# Patient Record
Sex: Male | Born: 1950 | Race: White | Hispanic: No | Marital: Single | Smoking: Former smoker
Health system: Southern US, Community
[De-identification: ages and names within clinical notes are randomized; demographics above are authoritative.]

## PROBLEM LIST (undated history)

## (undated) DIAGNOSIS — R600 Localized edema: Secondary | ICD-10-CM

## (undated) DIAGNOSIS — E7800 Pure hypercholesterolemia, unspecified: Secondary | ICD-10-CM

## (undated) DIAGNOSIS — M1990 Unspecified osteoarthritis, unspecified site: Secondary | ICD-10-CM

## (undated) DIAGNOSIS — T7840XA Allergy, unspecified, initial encounter: Secondary | ICD-10-CM

## (undated) DIAGNOSIS — Z9989 Dependence on other enabling machines and devices: Secondary | ICD-10-CM

## (undated) DIAGNOSIS — K759 Inflammatory liver disease, unspecified: Secondary | ICD-10-CM

## (undated) DIAGNOSIS — J869 Pyothorax without fistula: Secondary | ICD-10-CM

## (undated) DIAGNOSIS — M2550 Pain in unspecified joint: Secondary | ICD-10-CM

## (undated) DIAGNOSIS — R7303 Prediabetes: Secondary | ICD-10-CM

## (undated) DIAGNOSIS — I2510 Atherosclerotic heart disease of native coronary artery without angina pectoris: Secondary | ICD-10-CM

## (undated) DIAGNOSIS — K219 Gastro-esophageal reflux disease without esophagitis: Secondary | ICD-10-CM

## (undated) DIAGNOSIS — I10 Essential (primary) hypertension: Secondary | ICD-10-CM

## (undated) DIAGNOSIS — F419 Anxiety disorder, unspecified: Secondary | ICD-10-CM

## (undated) DIAGNOSIS — G4730 Sleep apnea, unspecified: Secondary | ICD-10-CM

## (undated) DIAGNOSIS — R0602 Shortness of breath: Secondary | ICD-10-CM

## (undated) DIAGNOSIS — J189 Pneumonia, unspecified organism: Secondary | ICD-10-CM

## (undated) DIAGNOSIS — E119 Type 2 diabetes mellitus without complications: Secondary | ICD-10-CM

## (undated) HISTORY — DX: Pyothorax without fistula: J86.9

## (undated) HISTORY — DX: Sleep apnea, unspecified: G47.30

## (undated) HISTORY — PX: HERNIA REPAIR: SHX51

## (undated) HISTORY — PX: EYE SURGERY: SHX253

## (undated) HISTORY — PX: INGUINAL HERNIA REPAIR: SUR1180

## (undated) HISTORY — PX: APPENDECTOMY: SHX54

## (undated) HISTORY — DX: Type 2 diabetes mellitus without complications: E11.9

## (undated) HISTORY — DX: Localized edema: R60.0

## (undated) HISTORY — DX: Pain in unspecified joint: M25.50

## (undated) HISTORY — DX: Dependence on other enabling machines and devices: Z99.89

## (undated) HISTORY — DX: Allergy, unspecified, initial encounter: T78.40XA

## (undated) HISTORY — DX: Shortness of breath: R06.02

---

## 1979-07-09 DIAGNOSIS — K759 Inflammatory liver disease, unspecified: Secondary | ICD-10-CM

## 1979-07-09 HISTORY — DX: Inflammatory liver disease, unspecified: K75.9

## 1998-06-23 ENCOUNTER — Emergency Department (HOSPITAL_COMMUNITY): Admission: EM | Admit: 1998-06-23 | Discharge: 1998-06-24 | Payer: Self-pay | Admitting: Emergency Medicine

## 1998-07-06 ENCOUNTER — Emergency Department (HOSPITAL_COMMUNITY): Admission: EM | Admit: 1998-07-06 | Discharge: 1998-07-06 | Payer: Self-pay | Admitting: Emergency Medicine

## 2013-06-20 ENCOUNTER — Other Ambulatory Visit: Payer: Self-pay | Admitting: Gastroenterology

## 2013-07-22 ENCOUNTER — Encounter (HOSPITAL_COMMUNITY): Payer: Self-pay | Admitting: *Deleted

## 2013-07-26 ENCOUNTER — Encounter (HOSPITAL_COMMUNITY): Payer: Self-pay | Admitting: Pharmacy Technician

## 2013-08-13 ENCOUNTER — Ambulatory Visit (HOSPITAL_COMMUNITY): Payer: BC Managed Care – PPO | Admitting: Anesthesiology

## 2013-08-13 ENCOUNTER — Encounter (HOSPITAL_COMMUNITY): Payer: Self-pay | Admitting: Anesthesiology

## 2013-08-13 ENCOUNTER — Encounter (HOSPITAL_COMMUNITY)
Admission: RE | Disposition: A | Discharge status: Home / Self Care | Payer: Self-pay | Source: Ambulatory Visit | Attending: Gastroenterology

## 2013-08-13 ENCOUNTER — Ambulatory Visit (HOSPITAL_COMMUNITY)
Admission: RE | Admit: 2013-08-13 | Discharge: 2013-08-13 | Disposition: A | Discharge status: Home / Self Care | Payer: BC Managed Care – PPO | Source: Ambulatory Visit | Attending: Gastroenterology | Admitting: Gastroenterology

## 2013-08-13 DIAGNOSIS — I10 Essential (primary) hypertension: Secondary | ICD-10-CM | POA: Insufficient documentation

## 2013-08-13 DIAGNOSIS — E119 Type 2 diabetes mellitus without complications: Secondary | ICD-10-CM | POA: Insufficient documentation

## 2013-08-13 DIAGNOSIS — K5730 Diverticulosis of large intestine without perforation or abscess without bleeding: Secondary | ICD-10-CM | POA: Insufficient documentation

## 2013-08-13 DIAGNOSIS — Z1211 Encounter for screening for malignant neoplasm of colon: Secondary | ICD-10-CM | POA: Insufficient documentation

## 2013-08-13 DIAGNOSIS — E7800 Pure hypercholesterolemia, unspecified: Secondary | ICD-10-CM | POA: Insufficient documentation

## 2013-08-13 HISTORY — DX: Type 2 diabetes mellitus without complications: E11.9

## 2013-08-13 HISTORY — DX: Gastro-esophageal reflux disease without esophagitis: K21.9

## 2013-08-13 HISTORY — DX: Inflammatory liver disease, unspecified: K75.9

## 2013-08-13 HISTORY — PX: COLONOSCOPY WITH PROPOFOL: SHX5780

## 2013-08-13 HISTORY — DX: Pure hypercholesterolemia, unspecified: E78.00

## 2013-08-13 HISTORY — DX: Essential (primary) hypertension: I10

## 2013-08-13 LAB — GLUCOSE, CAPILLARY: Glucose-Capillary: 88 mg/dL (ref 70–99)

## 2013-08-13 SURGERY — COLONOSCOPY WITH PROPOFOL
Anesthesia: Monitor Anesthesia Care

## 2013-08-13 MED ORDER — KETAMINE HCL 10 MG/ML IJ SOLN
INTRAMUSCULAR | Status: DC | PRN
  Administered 2013-08-13: 20 mg via INTRAVENOUS

## 2013-08-13 MED ORDER — MIDAZOLAM HCL 5 MG/5ML IJ SOLN
INTRAMUSCULAR | Status: DC | PRN
  Administered 2013-08-13 (×2): 1 mg via INTRAVENOUS

## 2013-08-13 MED ORDER — SODIUM CHLORIDE 0.9 % IV SOLN: INTRAVENOUS | Status: DC

## 2013-08-13 MED ORDER — PROPOFOL INFUSION 10 MG/ML OPTIME
INTRAVENOUS | Status: DC | PRN
  Administered 2013-08-13: 140 ug/kg/min via INTRAVENOUS

## 2013-08-13 MED ORDER — LACTATED RINGERS IV SOLN
INTRAVENOUS | Status: DC | PRN
  Administered 2013-08-13: 10:00:00 via INTRAVENOUS

## 2013-08-13 MED ORDER — FENTANYL CITRATE 0.05 MG/ML IJ SOLN
INTRAMUSCULAR | Status: DC | PRN
  Administered 2013-08-13 (×2): 50 ug via INTRAVENOUS

## 2013-08-13 SURGICAL SUPPLY — 21 items

## 2013-08-13 NOTE — Anesthesia Postprocedure Evaluation (Signed)
Anesthesia Post Note  Patient: Marc Gates  Procedure(s) Performed: Procedure(s) (LRB): COLONOSCOPY WITH PROPOFOL (N/A)  Anesthesia type: MAC  Patient location: PACU  Post pain: Pain level controlled  Post assessment: Post-op Vital signs reviewed  Last Vitals: BP 127/89  Pulse 73  Temp(Src) 36.7 C (Oral)  Resp 20  Ht 5' 10.5" (1.791 m)  Wt 195 lb (88.451 kg)  BMI 27.57 kg/m2  SpO2 98%  Post vital signs: Reviewed  Level of consciousness: awake  Complications: No apparent anesthesia complications

## 2013-08-13 NOTE — Anesthesia Preprocedure Evaluation (Signed)
Anesthesia Evaluation  Patient identified by MRN, date of birth, ID band Patient awake    Reviewed: Allergy & Precautions, H&P , NPO status , Patient's Chart, lab work & pertinent test results  Airway Mallampati: II TM Distance: >3 FB Neck ROM: Full    Dental  (+) Dental Advisory Given   Pulmonary neg pulmonary ROS,  breath sounds clear to auscultation        Cardiovascular hypertension, Pt. on medications negative cardio ROS  Rhythm:Regular Rate:Normal     Neuro/Psych negative neurological ROS  negative psych ROS   GI/Hepatic Neg liver ROS, GERD-  Medicated,(+) Hepatitis -  Endo/Other  diabetes, Type 2, Oral Hypoglycemic Agents  Renal/GU negative Renal ROS     Musculoskeletal negative musculoskeletal ROS (+)   Abdominal   Peds  Hematology negative hematology ROS (+)   Anesthesia Other Findings   Reproductive/Obstetrics                           Anesthesia Physical Anesthesia Plan  ASA: II  Anesthesia Plan: MAC   Post-op Pain Management:    Induction: Intravenous  Airway Management Planned:   Additional Equipment:   Intra-op Plan:   Post-operative Plan:   Informed Consent: I have reviewed the patients History and Physical, chart, labs and discussed the procedure including the risks, benefits and alternatives for the proposed anesthesia with the patient or authorized representative who has indicated his/her understanding and acceptance.   Dental advisory given  Plan Discussed with: CRNA  Anesthesia Plan Comments:         Anesthesia Quick Evaluation

## 2013-08-13 NOTE — Transfer of Care (Signed)
Immediate Anesthesia Transfer of Care Note  Patient: Marc Gates  Procedure(s) Performed: Procedure(s): COLONOSCOPY WITH PROPOFOL (N/A)  Patient Location: PACU  Anesthesia Type:MAC  Level of Consciousness: awake, alert  and oriented  Airway & Oxygen Therapy: Patient Spontanous Breathing and Patient connected to face mask oxygen  Post-op Assessment: Report given to PACU RN and Post -op Vital signs reviewed and stable  Post vital signs: Reviewed and stable  Complications: No apparent anesthesia complications

## 2013-08-13 NOTE — Op Note (Signed)
Procedure: Screening  Endoscopist: Danise Edge  Premedication: Propofol administered by anesthesia  Procedure: The patient was placed in the left lateral decubitus position. Anal inspection and digital rectal exam were normal. The Pentax pediatric colonoscope was introduced into the rectum and advanced to the cecum. A normal-appearing ileocecal valve and appendiceal orifice were identified. Colonic preparation for the exam today was good.  Rectum. Normal. Retroflex view of the distal rectum normal.  Sigmoid colon and descending colon. Extensive colonic diverticulosis  Splenic flexure. Normal.  Transverse colon. Normal.  Hepatic flexure. Normal.  Ascending colon. Normal.  Cecum and ileocecal valve. Normal.  Assessment: Normal screening proctocolonoscopy to the cecum  Recommendations: Schedule repeat screening colonoscopy using propofol sedation in 10 years.

## 2013-08-13 NOTE — H&P (Signed)
  Procedure: Screening colonoscopy  History: The patient is a 62 year old male born February 13, 1951. On 02/15/2007 he underwent a normal incomplete screening colonoscopy; the cecum was not examined.  The patient is scheduled to undergo a repeat screening colonoscopy today.  Past medical history: Type 2 diabetes mellitus. Hypertension. Panic disorder. Hypercholesterolemia. Allergic rhinitis. Plantar fasciitis. Herniorrhaphy. Appendectomy. Retinal detachment surgery. Cataract surgery.  Medication allergies: Penicillin  Exam: The patient is alert and lying comfortably on the endoscopy stretcher. Lungs are clear to auscultation. Cardiac exam reveals a regular rhythm. Abdomen is soft nontender to palpation  Plan: Proceed with screening colonoscopy

## 2013-08-13 NOTE — Preoperative (Signed)
Beta Blockers   Reason not to administer Beta Blockers:Not Applicable 

## 2013-08-14 ENCOUNTER — Encounter (HOSPITAL_COMMUNITY): Payer: Self-pay | Admitting: Gastroenterology

## 2017-03-20 ENCOUNTER — Other Ambulatory Visit: Payer: Self-pay | Admitting: Internal Medicine

## 2017-03-20 DIAGNOSIS — Z136 Encounter for screening for cardiovascular disorders: Secondary | ICD-10-CM

## 2017-04-04 ENCOUNTER — Ambulatory Visit
Admission: RE | Admit: 2017-04-04 | Discharge: 2017-04-04 | Disposition: A | Discharge status: Home / Self Care | Payer: Medicare Other | Source: Ambulatory Visit | Attending: Internal Medicine | Admitting: Internal Medicine

## 2017-04-04 DIAGNOSIS — Z136 Encounter for screening for cardiovascular disorders: Secondary | ICD-10-CM

## 2019-02-11 ENCOUNTER — Emergency Department (HOSPITAL_COMMUNITY)
Admission: EM | Admit: 2019-02-11 | Discharge: 2019-02-11 | Disposition: A | Discharge status: Home / Self Care | Payer: Medicare Other | Attending: Emergency Medicine | Admitting: Emergency Medicine

## 2019-02-11 ENCOUNTER — Emergency Department (HOSPITAL_COMMUNITY): Payer: Medicare Other

## 2019-02-11 ENCOUNTER — Encounter (HOSPITAL_COMMUNITY): Payer: Self-pay

## 2019-02-11 DIAGNOSIS — J181 Lobar pneumonia, unspecified organism: Secondary | ICD-10-CM | POA: Diagnosis not present

## 2019-02-11 DIAGNOSIS — J90 Pleural effusion, not elsewhere classified: Secondary | ICD-10-CM | POA: Insufficient documentation

## 2019-02-11 DIAGNOSIS — Z7984 Long term (current) use of oral hypoglycemic drugs: Secondary | ICD-10-CM | POA: Insufficient documentation

## 2019-02-11 DIAGNOSIS — Z87891 Personal history of nicotine dependence: Secondary | ICD-10-CM | POA: Diagnosis not present

## 2019-02-11 DIAGNOSIS — Z79899 Other long term (current) drug therapy: Secondary | ICD-10-CM | POA: Insufficient documentation

## 2019-02-11 DIAGNOSIS — E119 Type 2 diabetes mellitus without complications: Secondary | ICD-10-CM | POA: Insufficient documentation

## 2019-02-11 DIAGNOSIS — J189 Pneumonia, unspecified organism: Secondary | ICD-10-CM

## 2019-02-11 DIAGNOSIS — I10 Essential (primary) hypertension: Secondary | ICD-10-CM | POA: Diagnosis not present

## 2019-02-11 DIAGNOSIS — R0602 Shortness of breath: Secondary | ICD-10-CM | POA: Diagnosis present

## 2019-02-11 LAB — BASIC METABOLIC PANEL
Anion gap: 12 (ref 5–15)
BUN: 20 mg/dL (ref 8–23)
CO2: 19 mmol/L — ABNORMAL LOW (ref 22–32)
Calcium: 9.1 mg/dL (ref 8.9–10.3)
Chloride: 104 mmol/L (ref 98–111)
Creatinine, Ser: 1.07 mg/dL (ref 0.61–1.24)
GFR calc Af Amer: >60 mL/min (ref >60)
GFR calc non Af Amer: >60 mL/min (ref >60)
Glucose, Bld: 215 mg/dL — ABNORMAL HIGH (ref 70–99)
Potassium: 4.4 mmol/L (ref 3.5–5.1)
Sodium: 135 mmol/L (ref 135–145)

## 2019-02-11 LAB — CBC WITH DIFFERENTIAL/PLATELET
Abs Immature Granulocytes: 0.24 10*3/uL — ABNORMAL HIGH (ref 0.00–0.07)
Basophils Absolute: 0.2 10*3/uL — ABNORMAL HIGH (ref 0.0–0.1)
Basophils Relative: 1 %
Eosinophils Absolute: 0.1 10*3/uL (ref 0.0–0.5)
Eosinophils Relative: 0 %
HCT: 44.1 % (ref 39.0–52.0)
Hemoglobin: 14.7 g/dL (ref 13.0–17.0)
Immature Granulocytes: 1 %
Lymphocytes Relative: 5 %
Lymphs Abs: 1.2 10*3/uL (ref 0.7–4.0)
MCH: 30.5 pg (ref 26.0–34.0)
MCHC: 33.3 g/dL (ref 30.0–36.0)
MCV: 91.5 fL (ref 80.0–100.0)
Monocytes Absolute: 1.9 10*3/uL — ABNORMAL HIGH (ref 0.1–1.0)
Monocytes Relative: 7 %
Neutro Abs: 23 10*3/uL — ABNORMAL HIGH (ref 1.7–7.7)
Neutrophils Relative %: 86 %
Platelets: 344 10*3/uL (ref 150–400)
RBC: 4.82 MIL/uL (ref 4.22–5.81)
RDW: 12.5 % (ref 11.5–15.5)
WBC: 26.5 10*3/uL — ABNORMAL HIGH (ref 4.0–10.5)
nRBC: 0 % (ref 0.0–0.2)

## 2019-02-11 LAB — TROPONIN I: Troponin I: <0.03 ng/mL (ref <0.03)

## 2019-02-11 MED ORDER — CEFPODOXIME PROXETIL 200 MG PO TABS: 200.0000 mg | ORAL_TABLET | Freq: Two times a day (BID) | ORAL | 0 refills | Status: AC

## 2019-02-11 MED ORDER — IOHEXOL 350 MG/ML SOLN
100.0000 mL | Freq: Once | INTRAVENOUS | Status: AC | PRN
  Administered 2019-02-11: 100 mL via INTRAVENOUS

## 2019-02-11 MED ORDER — SODIUM CHLORIDE (PF) 0.9 % IJ SOLN
INTRAMUSCULAR | Status: AC
  Filled 2019-02-11: qty 50

## 2019-02-11 MED ORDER — AZITHROMYCIN 250 MG PO TABS
500.0000 mg | ORAL_TABLET | Freq: Once | ORAL | Status: AC
  Administered 2019-02-11: 18:00:00 500 mg via ORAL
  Filled 2019-02-11 (×2): qty 2

## 2019-02-11 MED ORDER — SODIUM CHLORIDE 0.9 % IV BOLUS
1000.0000 mL | Freq: Once | INTRAVENOUS | Status: AC
  Administered 2019-02-11: 1000 mL via INTRAVENOUS

## 2019-02-11 MED ORDER — DOXYCYCLINE HYCLATE 100 MG PO CAPS: 100.0000 mg | ORAL_CAPSULE | Freq: Two times a day (BID) | ORAL | 0 refills | Status: AC

## 2019-02-11 MED ORDER — ACETAMINOPHEN 325 MG PO TABS
650.0000 mg | ORAL_TABLET | Freq: Once | ORAL | Status: AC
  Administered 2019-02-11: 650 mg via ORAL
  Filled 2019-02-11: qty 2

## 2019-02-11 MED ORDER — AZITHROMYCIN 250 MG PO TABS: 500.0000 mg | ORAL_TABLET | Freq: Once | ORAL | Status: DC

## 2019-02-11 MED ORDER — SODIUM CHLORIDE 0.9 % IV SOLN
1.0000 g | Freq: Once | INTRAVENOUS | Status: AC
  Administered 2019-02-11: 1 g via INTRAVENOUS
  Filled 2019-02-11: qty 10

## 2019-02-11 NOTE — ED Provider Notes (Signed)
[facility]-EMERGENCY DEPT Provider Note   CSN: [id number] Arrival date & time: 02/11/19  1140    History   Chief Complaint Chief Complaint  Patient presents with   Shortness of Breath    HPI Marc Gates is a 68 y.o. male.     68 year old male with history of non-insulin-dependent diabetes, hypertension, high cholesterol, hepatitis and GERD presents with complaint of shortness of breath and right-sided chest pain.  Patient states that he has a baseline level of shortness of breath due to being obese however states he has been more short of breath since last night with pain in his right side chest, worse with taking a deep breath, palpation, movement.  Patient called his PCP today, does not believe he has coronavirus as he has not had any fevers however was sent to the emergency room due to his shortness of breath.  Patient reports 2 recent extended trips to [place], one in January ([place], states upon returning home he was sick for about 3 weeks) and 1 in March ([place], returned to [name] 01/11/2019). Denies any leg swelling, no history of previous PE or DVT but denies abdominal pain, nausea, vomiting, diaphoresis.  Patient states symptoms improved somewhat with taking Xanax, last took his Xanax an hour and half prior to arrival.  No other complaints or concerns.  Marc Gates was evaluated in Emergency Department on 02/11/2019 for the symptoms described in the history of present illness. He was evaluated in the context of the global COVID-19 pandemic, which necessitated consideration that the patient might be at risk for infection with the SARS-CoV-2 virus that causes COVID-19. Institutional protocols and algorithms that pertain to the evaluation of patients at risk for COVID-19 are in a state of rapid change based on information released by regulatory bodies including the CDC and federal and state organizations. These policies and algorithms were followed during the patient's  care in the ED.      Past Medical History:  Diagnosis Date   Diabetes mellitus without complication (HCC)    GERD (gastroesophageal reflux disease)    Hepatitis 1980'S   HEPATITIS B   High cholesterol    Hypertension     There are no active problems to display for this patient.   Past Surgical History:  Procedure Laterality Date   APPENDECTOMY  AGE 64 OR 13   COLONOSCOPY WITH PROPOFOL N/A 08/13/2013   Procedure: COLONOSCOPY WITH PROPOFOL;  Surgeon: Charolett Bumpers, MD;  Location: [facility];  Service: Endoscopy;  Laterality: N/A;   HERNIA REPAIR  40 WEEKS OLD        Home Medications    Prior to Admission medications   Medication Sig Start Date End Date Taking? Authorizing Provider  acetic acid-hydrocortisone (VOSOL-HC) OTIC solution Place 3 drops into the left ear 3 (three) times daily. 02/08/19  Yes [provider]  ALPRAZolam (XANAX) 0.25 MG tablet Take 0.25 mg by mouth every 6 (six) hours as needed for anxiety.   Yes [provider]  amLODipine-valsartan (EXFORGE) 5-160 MG tablet Take 1 tablet by mouth daily. 12/11/18  Yes [provider]  atorvastatin (LIPITOR) 20 MG tablet Take 20 mg by mouth every evening.   Yes [provider]  finasteride (PROSCAR) 5 MG tablet Take 5 mg by mouth every evening. 01/14/19  Yes [provider]  fluticasone (FLONASE) 50 MCG/ACT nasal spray Place 2 sprays into both nostrils daily. 01/14/19  Yes [provider]  glipiZIDE (GLUCOTROL) 5 MG tablet Take  5 mg by mouth daily. 12/29/18  Yes [provider]  ibuprofen (ADVIL,MOTRIN) 200 MG tablet Take 400-600 mg by mouth daily as needed for fever or moderate pain.   Yes [provider]  imipramine (TOFRANIL-PM) 75 MG capsule Take 75 mg by mouth at bedtime.   Yes [provider]  levalbuterol (XOPENEX HFA) 45 MCG/ACT inhaler Inhale 1-2 puffs into the lungs See admin instructions. Every 4 to 6 hours as needed for  wheezing or shortness of breath 12/11/18  Yes [provider]  levocetirizine (XYZAL) 5 MG tablet Take 5 mg by mouth every evening. 01/14/19  Yes [provider]  Menthol, Topical Analgesic, (BIOFREEZE EX) Apply topically.   Yes [provider]  metFORMIN (GLUCOPHAGE-XR) 500 MG 24 hr tablet Take 1,000 mg by mouth 2 (two) times daily. 12/11/18  Yes [provider]  MYRBETRIQ 50 MG TB24 tablet Take 50 mg by mouth every evening. 01/14/19  Yes [provider]  ranitidine (ZANTAC) 150 MG tablet Take 150 mg by mouth 2 (two) times daily.   Yes [provider]  Spacer/Aero-Holding Chambers (AEROCHAMBER PLUS FLO-VU LARGE) MISC See admin instructions. 12/11/18  Yes [provider]  trolamine salicylate (ASPERCREME) 10 % cream Apply 1 application topically as needed for muscle pain.   Yes [provider]  TRULICITY 0.75 MG/0.5ML SOPN Inject 0.75 mg into the skin once a week. 11/20/18  Yes [provider]  zolpidem (AMBIEN) 10 MG tablet Take 10 mg by mouth at bedtime. 01/29/19  Yes [provider]  cefpodoxime (VANTIN) 200 MG tablet Take 1 tablet (200 mg total) by mouth 2 (two) times daily for 7 days. 02/11/19 02/18/19  Jeannie Fend, PA-C  doxycycline (VIBRAMYCIN) 100 MG capsule Take 1 capsule (100 mg total) by mouth 2 (two) times daily for 7 days. 02/11/19 02/18/19  Jeannie Fend, PA-C  EPINEPHrine 0.3 mg/0.3 mL IJ SOAJ injection INJECT INTRAMUSCULARLY AS DIRECTED 12/07/18   [provider]    Family History History reviewed. No pertinent family history.  Social History Social History   Tobacco Use   Smoking status: Former Smoker    Packs/day: 2.00    Years: 10.00    Pack years: 20.00    Types: Cigarettes   Smokeless tobacco: Never Used  Substance Use Topics   Alcohol use: No   Drug use: No    Comment: QUIT SMOKING 25 YRS AGO     Allergies   Penicillins   Review of Systems Review of Systems    Constitutional: Negative for chills, diaphoresis and fever.  HENT: Negative for congestion.   Respiratory: Positive for shortness of breath. Negative for cough, chest tightness and wheezing.   Cardiovascular: Positive for chest pain. Negative for palpitations and leg swelling.  Gastrointestinal: Negative for abdominal pain, constipation, diarrhea, nausea and vomiting.  Musculoskeletal: Negative for arthralgias and myalgias.  Skin: Negative for rash and wound.  Allergic/Immunologic: Positive for immunocompromised state.  Neurological: Negative for dizziness and weakness.  Psychiatric/Behavioral: Negative for confusion.  All other systems reviewed and are negative.    Physical Exam Updated Vital Signs BP 127/82    Pulse (!) 113    Temp 97.9 F (36.6 C) (Oral)    Resp 14    SpO2 93%   Physical Exam Vitals signs and nursing note reviewed.  Constitutional:      General: He is not in acute distress.    Appearance: He is well-developed. He is obese. He is not diaphoretic.  HENT:  Head: Normocephalic and atraumatic.  Cardiovascular:     Rate and Rhythm: Regular rhythm. Tachycardia present.     Pulses: Normal pulses.     Heart sounds: No murmur.  Pulmonary:     Effort: Pulmonary effort is normal. Tachypnea present.     Breath sounds: Normal breath sounds. No decreased breath sounds or wheezing.  Chest:     Chest wall: Tenderness present.    Abdominal:     Palpations: Abdomen is soft.     Tenderness: There is no abdominal tenderness.  Musculoskeletal:     Right lower leg: He exhibits no tenderness. No edema.     Left lower leg: He exhibits no tenderness. No edema.  Skin:    General: Skin is warm and dry.     Findings: No erythema.  Neurological:     Mental Status: He is alert and oriented to person, place, and time.  Psychiatric:        Mood and Affect: Mood is anxious.     Comments: Pacing the room      ED Treatments / Results  Labs (all labs ordered are listed,  but only abnormal results are displayed) Labs Reviewed  BASIC METABOLIC PANEL - Abnormal; Notable for the following components:      Result Value   CO2 19 (*)    Glucose, Bld 215 (*)    All other components within normal limits  CBC WITH DIFFERENTIAL/PLATELET - Abnormal; Notable for the following components:   WBC 26.5 (*)    Neutro Abs 23.0 (*)    Monocytes Absolute 1.9 (*)    Basophils Absolute 0.2 (*)    Abs Immature Granulocytes 0.24 (*)    All other components within normal limits  CULTURE, BLOOD (ROUTINE X 2)  CULTURE, BLOOD (ROUTINE X 2)  TROPONIN I    EKG EKG Interpretation  Date/Time:  Monday February 11 2019 11:47:28 EDT Ventricular Rate:  128 PR Interval:    QRS Duration: 98 QT Interval:  311 QTC Calculation: 454 R Axis:   -85 Text Interpretation:  Sinus tachycardia Probable left atrial enlargement RSR' in V1 or V2, right VCD or RVH Left ventricular hypertrophy Inferior infarct, old No old tracing to compare Confirmed by Azalia Bilisampos, Kevin ([id number]) on 02/11/2019 12:53:35 PM Also confirmed by Azalia Bilisampos, Kevin ([id number]), editor Sheppard EvensSimpson, Miranda [phone]  on 02/11/2019 3:14:52 PM   Radiology Dg Chest 2 View  Result Date: 02/11/2019 CLINICAL DATA:  Short of breath EXAM: CHEST - 2 VIEW COMPARISON:  None. FINDINGS: Upper normal heart size. Patchy airspace opacities at the right lung base are worrisome for pneumonia. Left lung is clear. No pneumothorax. No pleural effusion. IMPRESSION: Patchy airspace disease at the right lung base is worrisome for pneumonia. Followup PA and lateral chest X-ray is recommended in 3-4 weeks following trial of antibiotic therapy to ensure resolution and exclude underlying malignancy. Electronically Signed   By: Jolaine ClickArthur  Hoss M.D.   On: 02/11/2019 12:27   Ct Angio Chest Pe W/cm &/or Wo Cm  Result Date: 02/11/2019 CLINICAL DATA:  Shortness of breath and chest pain EXAM: CT ANGIOGRAPHY CHEST WITH CONTRAST TECHNIQUE: Multidetector CT imaging of the chest was performed  using the standard protocol during bolus administration of intravenous contrast. Multiplanar CT image reconstructions and MIPs were obtained to evaluate the vascular anatomy. CONTRAST:  83 ml OMNIPAQUE IOHEXOL 350 MG/ML SOLN COMPARISON:  Chest radiograph February 11, 2019 FINDINGS: Cardiovascular: There is no central pulmonary embolus evident. More peripheral pulmonary there is no appreciable thoracic  aortic aneurysm or dissection. There are some mild foci of calcification in proximal visualized great vessels. Visualized great vessels otherwise appear unremarkable. There is aortic atherosclerosis. There are foci coronary artery calcification. There is no pericardial effusion or pericardial thickening evident. Vessels are less than optimally assessed due to a degree of motion artifact. There is no overt pulmonary embolus in the more peripheral vessels. Mediastinum/Nodes: Thyroid appears unremarkable. No adenopathy is evident in the thoracic region. No esophageal lesions are appreciable. Lungs/Pleura: There is a moderate pleural effusion on the right. There is patchy atelectatic change and mild consolidation in the right lower lobe. Areas of atelectatic change are noted elsewhere in each lower lobe as well as in each right middle lobe and inferior lingular region. On axial slice 95 series 10, there is a 3 mm nodular opacity in the right middle lobe. On axial slice 59 series 10, there is a nodular opacity measuring 8 x 4 mm in the anterior segment of the right upper lobe. Upper Abdomen: Visualized upper abdominal structures appear unremarkable. Musculoskeletal: No blastic or lytic bone lesions are evident. No chest wall lesions appreciable. Review of the MIP images confirms the above findings. IMPRESSION: 1. No evidence of central pulmonary embolus. Note that motion artifact precludes optimal assessment of more peripheral pulmonary artery branches. No obvious pulmonary embolus is evident in these more peripheral  structures. 2. There is patchy atelectatic change and mild consolidation in the right lower lobe. This could reflect pneumonia. There is a moderate right pleural effusion. 3. There is a nodular opacity in the right upper lobe measuring 8 x 4 mm. Non-contrast chest CT at 6-12 months is recommended. If the nodule is stable at time of repeat CT, then future CT at 18-24 months (from today's scan) is considered optional for low-risk patients, but is recommended for high-risk patients. This recommendation follows the consensus statement: Guidelines for Management of Incidental Pulmonary Nodules Detected on CT Images: From the Fleischner Society 2017; Radiology 2017; 284:228-243. Smaller nodular opacity noted in the right middle lobe. 4. There are foci of aortic atherosclerosis. Foci of coronary artery calcification noted. Aortic Atherosclerosis (ICD10-I70.0). Electronically Signed   By: Bretta Bang III M.D.   On: 02/11/2019 14:58    Procedures Procedures (including critical care time)  Medications Ordered in ED Medications  sodium chloride (PF) 0.9 % injection (0 mLs  Hold 02/11/19 1501)  iohexol (OMNIPAQUE) 350 MG/ML injection 100 mL (100 mLs Intravenous Contrast Given 02/11/19 1424)  acetaminophen (TYLENOL) tablet 650 mg (650 mg Oral Given 02/11/19 1604)  sodium chloride 0.9 % bolus 1,000 mL (1,000 mLs Intravenous New Bag/Given 02/11/19 1716)  cefTRIAXone (ROCEPHIN) 1 g in sodium chloride 0.9 % 100 mL IVPB (1 g Intravenous New Bag/Given 02/11/19 1610)  azithromycin (ZITHROMAX) tablet 500 mg (500 mg Oral Given 02/11/19 1745)     Initial Impression / Assessment and Plan / ED Course  I have reviewed the triage vital signs and the nursing notes.  Pertinent labs & imaging results that were available during my care of the patient were reviewed by me and considered in my medical decision making (see chart for details).  Clinical Course as of Feb 10 1857  Mon Feb 11, 2019  3661 68 year old male with complaint  of shortness of breath and right-sided chest pain since of last night.  Pain is worse when trying to lie supine.  On exam patient is anxious appearing, pacing the room, tachycardic, mildly tachypneic, maintaining room air sats in the upper 90s.  No calf swelling or tenderness, no pitting edema lower extremities.  Differential diagnosis considered but not limited to PE, pneumonia, CHF.  Review of lab work shows WBC 26.5k, with increase in neutrophils, monocytes. BMP with Co2 19. Troponin negative x 1. EKG without acute ischemic changes. CXR concerning for RLL PNA.  CTA chest obtained to evaluate to PE vs PNA vs effusion. CT A chest with right pleural effusion, possible PNA, recommends repeat CT in 6-12 months.  Case reviewed with Dr. Patria Mane, ER attending who has seen the patient- recommends IV fluids, Tylenol, abx. Plan is to reassess after treatment.   [LM]  1752 Patient appears more relaxed, respirations even and unlabored.  Heart rate 109.  Plan is to finish IV fluids, ambulate and possibly discharge if able to maintain O2 sats on room air.   [LM]  1841 Patient ambulatory with slight increased work in breathing, O2 93% at the lowest and quickly improves with rest. Offered admission vs closed PCP follow up with patient. Patient prefers dc at this time, understands if he declines at all or develops fever he should return to the ER and may need admission. Patient to call his PCP in the morning to arrange follow up.    [LM]    Clinical Course User Index [LM] Jeannie Fend, PA-C      Final Clinical Impressions(s) / ED Diagnoses   Final diagnoses:  Community acquired pneumonia of right lower lobe of lung (HCC)  Pleural effusion on right    ED Discharge Orders         Ordered    cefpodoxime (VANTIN) 200 MG tablet  2 times daily     02/11/19 1849    doxycycline (VIBRAMYCIN) 100 MG capsule  2 times daily     02/11/19 1849           Jeannie Fend, PA-C 02/11/19 Evern Bio,  MD 02/15/19 1454

## 2019-02-11 NOTE — ED Notes (Signed)
Bed: SX28 Expected date:  Expected time:  Means of arrival:  Comments: Tri 8

## 2019-02-11 NOTE — ED Triage Notes (Addendum)
Shob "for a long time" but today woke up with worse shob.  Patient called primary doctor and told to come to ED for DG.   Last night had a coughing spell and woke up around 4am and could not walk from pain from coughing and shob.   C/O dry cough   C/O right rib cage pain   Denies n/v, diarrhea, and fever   Sharp pain when patient takes a deep break.  Travel to [place] in January and [place] in March.    Mask on patient in triage.   Patient took a xanax about 1.5 hours ago  Hx. Allergies, DM 2, Hypertension   Patient ambulatory in triage and able to speak in complete sentence with no problems.

## 2019-02-11 NOTE — Discharge Instructions (Signed)
Take Vantin and Doxycycline as prescribed and complete the full course. Hold Metformin tonight, continue with regular dose tomorrow. Call your doctor tomorrow for close follow up. Return to ER for any worsening or concerning symptoms. Monitor for fever (100.4 or higher).  Review CT report with your doctor, may need a repeat chest CT in 6-12 months.

## 2019-02-11 NOTE — ED Notes (Signed)
Bed: WTR8 Expected date:  Expected time:  Means of arrival:  Comments: 

## 2019-02-16 LAB — CULTURE, BLOOD (ROUTINE X 2)
Culture: NO GROWTH
Culture: NO GROWTH
Special Requests: ADEQUATE
Special Requests: ADEQUATE

## 2019-03-04 ENCOUNTER — Ambulatory Visit
Admission: RE | Admit: 2019-03-04 | Discharge: 2019-03-04 | Disposition: A | Discharge status: Home / Self Care | Payer: Medicare Other | Source: Ambulatory Visit | Attending: Internal Medicine | Admitting: Internal Medicine

## 2019-03-04 ENCOUNTER — Other Ambulatory Visit: Payer: Self-pay | Admitting: Internal Medicine

## 2019-03-04 ENCOUNTER — Other Ambulatory Visit: Payer: Self-pay

## 2019-03-04 DIAGNOSIS — J189 Pneumonia, unspecified organism: Secondary | ICD-10-CM

## 2019-03-04 DIAGNOSIS — J181 Lobar pneumonia, unspecified organism: Principal | ICD-10-CM

## 2019-03-06 ENCOUNTER — Other Ambulatory Visit (HOSPITAL_COMMUNITY): Payer: Self-pay | Admitting: Internal Medicine

## 2019-03-06 ENCOUNTER — Other Ambulatory Visit: Payer: Self-pay | Admitting: Internal Medicine

## 2019-03-06 DIAGNOSIS — J90 Pleural effusion, not elsewhere classified: Secondary | ICD-10-CM

## 2019-03-07 ENCOUNTER — Other Ambulatory Visit (HOSPITAL_COMMUNITY): Payer: Self-pay | Admitting: Internal Medicine

## 2019-03-07 ENCOUNTER — Other Ambulatory Visit: Payer: Self-pay

## 2019-03-07 ENCOUNTER — Ambulatory Visit (HOSPITAL_COMMUNITY)
Admission: RE | Admit: 2019-03-07 | Discharge: 2019-03-07 | Disposition: A | Discharge status: Home / Self Care | Payer: Medicare Other | Source: Ambulatory Visit | Attending: Internal Medicine | Admitting: Internal Medicine

## 2019-03-07 ENCOUNTER — Encounter (HOSPITAL_COMMUNITY): Payer: Self-pay | Admitting: Radiology

## 2019-03-07 DIAGNOSIS — J90 Pleural effusion, not elsewhere classified: Secondary | ICD-10-CM

## 2019-03-07 HISTORY — PX: IR THORACENTESIS ASP PLEURAL SPACE W/IMG GUIDE: IMG5380

## 2019-03-07 LAB — BODY FLUID CELL COUNT WITH DIFFERENTIAL
Eos, Fluid: 0 %
Lymphs, Fluid: 1 %
Monocyte-Macrophage-Serous Fluid: 8 % — ABNORMAL LOW (ref 50–90)
Neutrophil Count, Fluid: 91 % — ABNORMAL HIGH (ref 0–25)
Total Nucleated Cell Count, Fluid: 1150 cu mm — ABNORMAL HIGH (ref 0–1000)

## 2019-03-07 LAB — GRAM STAIN

## 2019-03-07 LAB — GLUCOSE, PLEURAL OR PERITONEAL FLUID: Glucose, Fluid: 90 mg/dL

## 2019-03-07 LAB — LACTATE DEHYDROGENASE, PLEURAL OR PERITONEAL FLUID: LD, Fluid: 1482 U/L — ABNORMAL HIGH (ref 3–23)

## 2019-03-07 LAB — PROTEIN, PLEURAL OR PERITONEAL FLUID: Total protein, fluid: 5.3 g/dL

## 2019-03-07 MED ORDER — LIDOCAINE HCL 1 % IJ SOLN
INTRAMUSCULAR | Status: AC | PRN
  Administered 2019-03-07: 15 mL

## 2019-03-07 MED ORDER — LIDOCAINE HCL 1 % IJ SOLN
INTRAMUSCULAR | Status: AC
  Filled 2019-03-07: qty 20

## 2019-03-07 NOTE — Procedures (Signed)
PROCEDURE SUMMARY:  Severely loculated moderate sized right pleural effusion.  Successful US guided right thoracentesis. Yielded only 95 mL of clear yellow fluid. Pt tolerated procedure well. No immediate complications.  Specimen was sent for labs. CXR ordered.  EBL < 5 mL  Brayton El PA-C 03/07/2019 11:07 AM

## 2019-03-08 LAB — PH, BODY FLUID: pH, Body Fluid: 7.4

## 2019-03-08 LAB — PATHOLOGIST SMEAR REVIEW

## 2019-03-12 LAB — CULTURE, BODY FLUID W GRAM STAIN -BOTTLE: Culture: NO GROWTH

## 2019-03-25 ENCOUNTER — Ambulatory Visit
Admission: RE | Admit: 2019-03-25 | Discharge: 2019-03-25 | Disposition: A | Discharge status: Home / Self Care | Payer: Medicare Other | Source: Ambulatory Visit | Attending: Internal Medicine | Admitting: Internal Medicine

## 2019-03-25 ENCOUNTER — Other Ambulatory Visit: Payer: Self-pay | Admitting: Internal Medicine

## 2019-03-25 DIAGNOSIS — J90 Pleural effusion, not elsewhere classified: Secondary | ICD-10-CM

## 2019-03-28 ENCOUNTER — Other Ambulatory Visit: Payer: Self-pay | Admitting: *Deleted

## 2019-03-28 ENCOUNTER — Ambulatory Visit
Admission: RE | Admit: 2019-03-28 | Discharge: 2019-03-28 | Disposition: A | Discharge status: Home / Self Care | Payer: Medicare Other | Source: Ambulatory Visit | Attending: Surgery | Admitting: Surgery

## 2019-03-28 DIAGNOSIS — J90 Pleural effusion, not elsewhere classified: Secondary | ICD-10-CM

## 2019-03-29 ENCOUNTER — Other Ambulatory Visit: Payer: Self-pay | Admitting: *Deleted

## 2019-03-29 ENCOUNTER — Encounter: Payer: Self-pay | Admitting: Surgery

## 2019-03-29 ENCOUNTER — Institutional Professional Consult (permissible substitution): Payer: Medicare Other | Admitting: Surgery

## 2019-03-29 ENCOUNTER — Other Ambulatory Visit: Payer: Self-pay

## 2019-03-29 VITALS — BP 160/80 | HR 100 | Temp 97.9°F | Resp 20 | Ht 70.5 in | Wt 246.0 lb

## 2019-03-29 DIAGNOSIS — J869 Pyothorax without fistula: Secondary | ICD-10-CM | POA: Diagnosis not present

## 2019-03-29 DIAGNOSIS — J90 Pleural effusion, not elsewhere classified: Secondary | ICD-10-CM | POA: Insufficient documentation

## 2019-04-02 ENCOUNTER — Encounter (HOSPITAL_COMMUNITY): Payer: Self-pay | Admitting: *Deleted

## 2019-04-02 ENCOUNTER — Other Ambulatory Visit: Payer: Self-pay

## 2019-04-02 ENCOUNTER — Other Ambulatory Visit (HOSPITAL_COMMUNITY)
Admission: RE | Admit: 2019-04-02 | Discharge: 2019-04-02 | Disposition: A | Discharge status: Home / Self Care | Payer: Medicare Other | Source: Ambulatory Visit | Attending: Surgery | Admitting: Surgery

## 2019-04-02 ENCOUNTER — Encounter: Payer: Self-pay | Admitting: Surgery

## 2019-04-02 DIAGNOSIS — Z1159 Encounter for screening for other viral diseases: Secondary | ICD-10-CM | POA: Insufficient documentation

## 2019-04-02 LAB — SARS CORONAVIRUS 2 BY RT PCR (HOSPITAL ORDER, PERFORMED IN ~~LOC~~ HOSPITAL LAB): SARS Coronavirus 2: NEGATIVE

## 2019-04-02 NOTE — Progress Notes (Signed)
Spoke with pt for pre-op call. Pt denies cardiac history. Pt is a type 2 diabetic. Last A1C was 8.1 on 11/19/18. Pt states he does not check his blood sugar in the mornings. Pt instructed not to take his Glipizide this evening and in the AM. Pt instructed to check his blood sugar in the AM when he gets up. If blood sugar is 70 or below, treat with 1/2 cup of clear juice (apple or cranberry) and recheck blood sugar 15 minutes after drinking juice. Pt voiced understanding.  Pt's PCP is Dr. Kirby Funk   Coronavirus Screening  Have you experienced the following symptoms:  Cough NO Fever (>100.67F) NO  Runny nose NO Sore throat NO Difficulty breathing/shortness of breath  NO  Have you or a family member traveled in the last 14 days and where? NO  Pt had Covid 19 testing done this AM.     Patient reminded that hospital visitation restrictions are in effect and the importance of the restrictions.

## 2019-04-02 NOTE — Progress Notes (Signed)
Cardiothoracic Surgery Consultation  PCP is Kirby Funk, MD Referring Provider is Kirby Funk, MD  Chief Complaint  Patient presents with   Pleural Effusion    Surgical eval, Chest CT 03/28/19    HPI:  The patient is a 68 year old gentleman with history of hypertension, hyperlipidemia, and diabetes, who was traveling in [place] in late January and [place] in late February.  He was in his usual state of health until he woke up one morning in early April not feeling well with some shortness of breath with ambulation or when bending over.  He also had severe pain in the right lateral chest which was made worse by deep breathing and coughing.  He was coughing up some mucus.  He had no fever or chills but did have some fatigue.  He was evaluated by Dr. Valentina Lucks with a telemedicine visit and was told to go to the emergency room.  He went a CT scan of the chest which ruled out pulmonary embolism.  There is patchy atelectatic changes or mild consolidation in the right lower lobe that was felt to possibly be pneumonia.  There is a moderate size right pleural effusion.  There was a 8 mm x 4 mm nodular opacity in the right upper lobe.  Laboratory examination showed leukocytosis of 26.5.  The patient was offered admission for intravenous antibiotics versus discharge from the ER on oral antibiotics and follow-up with his primary physician.  Patient wanted to go home and was sent out on doxycycline 100 mg twice daily.  He improved clinically but a follow-up chest x-ray on 03/04/2019 showed an interval marked increase in the right pleural effusion which was now large.  There is associated dense passive atelectasis or pneumonia involving the right lower lobe.  The patient followed up with Dr. Valentina Lucks and was sent for a thoracentesis by conventional radiology which was performed on 03/07/2019.  This showed a loculated right pleural fluid collection and only 95 cc of clear yellow fluid was removed.  This was  reportedly exudative.  He returned to see Dr. Valentina Lucks 03/25/2019 and a follow-up chest x-ray showed a stable large right pleural effusion with right lower lobe atelectasis and/or infiltrate.  Dr. Valentina Lucks called me and I agreed to see the patient in the office for evaluation.  The patient says that he feels much better than he did in April.  He still has mild right chest discomfort at times.  He still has some shortness of breath with exertion such as walking up an incline or stairs.  He has no problems on level ground.  He denies any fever or chills.  He said no cough or sputum production.  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  He is a non-smoker  Past Medical History:  Diagnosis Date   Anxiety    Diabetes mellitus without complication (HCC)    GERD (gastroesophageal reflux disease)    Hepatitis 1980'S   HEPATITIS B   High cholesterol    Hypertension    Pneumonia     Past Surgical History:  Procedure Laterality Date   APPENDECTOMY  AGE 43 OR 13   COLONOSCOPY WITH PROPOFOL N/A 08/13/2013   Procedure: COLONOSCOPY WITH PROPOFOL;  Surgeon: Charolett Bumpers, MD;  Location: [facility];  Service: Endoscopy;  Laterality: N/A;   EYE SURGERY Left    retinal detachment and cataract surgery   HERNIA REPAIR  28 WEEKS OLD   IR THORACENTESIS ASP PLEURAL SPACE W/IMG GUIDE  03/07/2019    History reviewed. No pertinent family history.  Social History Social History   Tobacco Use   Smoking status: Former Smoker    Packs/day: 2.00    Years: 10.00    Pack years: 20.00    Types: Cigarettes   Smokeless tobacco: Never Used  Substance Use Topics   Alcohol use: Yes    Comment: rare   Drug use: No    Comment: QUIT SMOKING 25 YRS AGO    Current Outpatient Medications  Medication Sig Dispense Refill   ALPRAZolam (XANAX) 0.25 MG tablet Take 0.25 mg by mouth every 6 (six) hours as needed for anxiety.     amLODipine-valsartan (EXFORGE) 5-160 MG tablet Take 1 tablet  by mouth daily.     atorvastatin (LIPITOR) 20 MG tablet Take 20 mg by mouth every evening.     EPINEPHrine 0.3 mg/0.3 mL IJ SOAJ injection Inject 0.3 mg into the muscle as needed for anaphylaxis.      finasteride (PROSCAR) 5 MG tablet Take 5 mg by mouth every evening.     fluticasone (FLONASE) 50 MCG/ACT nasal spray Place 2 sprays into both nostrils every evening.      glipiZIDE (GLUCOTROL) 5 MG tablet Take 5 mg by mouth 2 (two) times a day.      ibuprofen (ADVIL,MOTRIN) 200 MG tablet Take 400-600 mg by mouth daily as needed for fever or moderate pain.     imipramine (TOFRANIL-PM) 75 MG capsule Take 75 mg by mouth at bedtime.     levalbuterol (XOPENEX HFA) 45 MCG/ACT inhaler Inhale 1-2 puffs into the lungs every 4 (four) hours as needed for wheezing or shortness of breath.      levocetirizine (XYZAL) 5 MG tablet Take 5 mg by mouth every evening.     Menthol, Topical Analgesic, (BIOFREEZE EX) Apply 1 application topically 4 (four) times daily as needed (pain.).      metFORMIN (GLUCOPHAGE-XR) 500 MG 24 hr tablet Take 1,000 mg by mouth 2 (two) times daily.     MYRBETRIQ 50 MG TB24 tablet Take 50 mg by mouth every evening.     Spacer/Aero-Holding Chambers (AEROCHAMBER PLUS FLO-VU LARGE) MISC See admin instructions.     zolpidem (AMBIEN) 10 MG tablet Take 10 mg by mouth at bedtime.     aspirin EC 81 MG tablet Take 81 mg by mouth daily.     chlorpheniramine (CHLOR-TRIMETON) 4 MG tablet Take 4 mg by mouth 3 (three) times daily as needed for allergies.     Dulaglutide (TRULICITY) 1.5 MG/0.5ML SOPN Inject 1.5 mg into the skin every Friday.     famotidine (PEPCID) 20 MG tablet Take 20 mg by mouth 2 (two) times daily.     naproxen sodium (ALEVE) 220 MG tablet Take 220-440 mg by mouth 2 (two) times daily as needed (pain.).     No current facility-administered medications for this visit.     Allergies  Allergen Reactions   Penicillins Other (See Comments)    Childhood Allergy Did  it involve swelling of the face/tongue/throat, SOB, or low BP? UNK Did it involve sudden or severe rash/hives, skin peeling, or any reaction on the inside of your mouth or nose? UNK Did you need to seek medical attention at a hospital or doctor's office? UNK When did it last happen?childhood  If all above answers are NO, may proceed with cephalosporin use.     Review of Systems  Constitutional: Negative for activity change, appetite change, chills, fatigue, fever and unexpected weight  change.  HENT: Negative.   Eyes: Negative.   Respiratory: Positive for chest tightness and shortness of breath. Negative for cough.   Cardiovascular: Negative.   Gastrointestinal: Negative.   Endocrine: Negative.   Genitourinary: Positive for frequency.  Musculoskeletal: Negative.   Allergic/Immunologic: Negative.   Neurological: Negative.   Hematological: Negative.   Psychiatric/Behavioral: Negative.     BP (!) 160/80    Pulse 100    Temp 97.9 F (36.6 C) (Skin)    Resp 20    Ht 5' 10.5" (1.791 m)    Wt 246 lb (111.6 kg)    SpO2 92% Comment: RA   BMI 34.80 kg/m  Physical Exam Constitutional:      Appearance: Normal appearance. He is obese.  HENT:     Head: Normocephalic and atraumatic.  Eyes:     Extraocular Movements: Extraocular movements intact.     Conjunctiva/sclera: Conjunctivae normal.     Pupils: Pupils are equal, round, and reactive to light.  Neck:     Musculoskeletal: No neck rigidity or muscular tenderness.  Cardiovascular:     Rate and Rhythm: Normal rate and regular rhythm.     Heart sounds: Normal heart sounds. No murmur.  Pulmonary:     Effort: Pulmonary effort is normal.     Comments: Decreased breath sounds over the right lower lobe Abdominal:     Palpations: Abdomen is soft.     Tenderness: There is no abdominal tenderness.  Musculoskeletal:        General: No swelling.  Lymphadenopathy:     Cervical: No cervical adenopathy.  Skin:    General: Skin is warm  and dry.  Neurological:     General: No focal deficit present.     Mental Status: He is alert and oriented to person, place, and time.  Psychiatric:        Mood and Affect: Mood normal.        Behavior: Behavior normal.        Thought Content: Thought content normal.        Judgment: Judgment normal.      Diagnostic Tests:  CLINICAL DATA:  Loculated right-sided pleural effusion  EXAM: CT CHEST WITHOUT CONTRAST  TECHNIQUE: Multidetector CT imaging of the chest was performed following the standard protocol without IV contrast.  COMPARISON:  CT chest dated 02/11/2019  FINDINGS: Cardiovascular: Aortic calcifications are noted. Heart size is within normal limits. There are coronary and aortic valve calcifications. No pericardial effusion.  Mediastinum/Nodes: No enlarged mediastinal or axillary lymph nodes. Thyroid gland, trachea, and esophagus demonstrate no significant findings.  Lungs/Pleura: There is a 5 mm pulmonary nodule in the right upper lobe (axial series 8, image 46). There are multiple small pulmonary nodules at the left lung base measuring up to approximately 6 mm. There are 5 mm pulmonary nodules in the right lower lobe. There is a moderate-sized chronic appearing right-sided pleural effusion. There is thickening of the surrounding pleura. There is adjacent airspace atelectasis versus consolidation.  Upper Abdomen: No acute abnormality.  Musculoskeletal: No chest wall mass or suspicious bone lesions identified.  IMPRESSION: 1. Chronic appearing right-sided pleural effusion with associated pleural thickening. Correlation with recent thoracentesis results is recommended as an underlying empyema cannot be excluded on this exam. There is adjacent atelectasis versus consolidation. 2. Multiple pulmonary nodules bilaterally measuring up to approximately 6 mm, better visualized on today's exam. Non-contrast chest CT at 3-6 months is recommended. If the  nodules are stable at time of repeat CT,  then future CT at 18-24 months (from today's scan) is considered optional for low-risk patients, but is recommended for high-risk patients. This recommendation follows the consensus statement: Guidelines for Management of Incidental Pulmonary Nodules Detected on CT Images: From the Fleischner Society 2017; Radiology 2017; 284:228-243. 3.  Aortic Atherosclerosis (ICD10-I70.0).   Electronically Signed   By: Katherine Mantlehristopher  Green M.D.   On: 03/28/2019 16:20  Impression:  This 68 year old gentleman presented in early April with acute onset of right sided pleuritic chest pain and shortness of breath with cough and leukocytosis.  A CT scan of the chest was consistent with right lower lobe pneumonia and a parapneumonic effusion.  Despite treatment with doxycycline he developed a progressively enlarging right pleural effusion with associated right lower lobe atelectasis.  He is now about 6 weeks after presentation without effusion and it is still large with significant collapse of the right lower lobe.  There is significant pleural thickening around the fluid collection.  Although this fluid collection has been present for about 6 weeks I think he is probably still in the window where it may be suitable for drainage and decortication of the lung to allow reexpansion of the right lower lobe.  The other option would be to leave it alone although I think he would have a chronically trapped right lower lobe that may cause him persistent shortness of breath.  There is some risk with the surgical procedure because it would require a small thoracotomy and decortication of the lung which may result in air leaks from the surface of the lung that could prolong his hospital course.  It is also possible that the right lower lobe may be chronically trapped by scar tissue and not reexpand to fill the space.  I discussed the options of surgical versus nonsurgical treatment with  him and the pros and cons of both approaches.  I think surgical treatment is the best option at this time.  I discussed the operative procedure of right thoracotomy for drainage of the loculated pleural fluid collection and decortication of the lung with him including alternatives, benefits, and risks including but not limited to bleeding, infection, prolonged air leak, persistent pleural space complications, and failure of the right lower lobe to reexpand.  He understands all this and agrees to proceed.  Plan:  He will be admitted on Wednesday, 04/03/2023 right thoracotomy with drainage of the loculated pleural fluid collection and decortication of the lung.  I spent 45 minutes performing this consultation and > 50% of this time was spent face to face counseling and coordinating the care of this patient's right empyema.   Alleen BorneBryan K Salah Burlison, MD [facility] [phone]

## 2019-04-02 NOTE — H&P (Signed)
[address]       [place] [address]             [phone]      Cardiothoracic Surgery Admission History and Physical   PCP is Kirby Funk, MD Referring Provider is Kirby Funk, MD      Chief Complaint  Patient presents with   Right empyema        HPI:  The patient is a 68 year old gentleman with history of hypertension, hyperlipidemia, and diabetes, who was traveling in [place] in late January and [place] in late February.  He was in his usual state of health until he woke up one morning in early April not feeling well with some shortness of breath with ambulation or when bending over.  He also had severe pain in the right lateral chest which was made worse by deep breathing and coughing.  He was coughing up some mucus.  He had no fever or chills but did have some fatigue.  He was evaluated by Dr. Valentina Lucks with a telemedicine visit and was told to go to the emergency room.  He went a CT scan of the chest which ruled out pulmonary embolism.  There is patchy atelectatic changes or mild consolidation in the right lower lobe that was felt to possibly be pneumonia.  There is a moderate size right pleural effusion.  There was a 8 mm x 4 mm nodular opacity in the right upper lobe.  Laboratory examination showed leukocytosis of 26.5.  The patient was offered admission for intravenous antibiotics versus discharge from the ER on oral antibiotics and follow-up with his primary physician.  Patient wanted to go home and was sent out on doxycycline 100 mg twice daily.  He improved clinically but a follow-up chest x-ray on 03/04/2019 showed an interval marked increase in the right pleural effusion which was now large.  There is associated dense passive atelectasis or pneumonia involving the right lower lobe.  The patient followed up with Dr. Valentina Lucks and was sent for a thoracentesis by conventional radiology which was performed on 03/07/2019.  This showed a loculated  right pleural fluid collection and only 95 cc of clear yellow fluid was removed.  This was reportedly exudative.  He returned to see Dr. Valentina Lucks 03/25/2019 and a follow-up chest x-ray showed a stable large right pleural effusion with right lower lobe atelectasis and/or infiltrate.  Dr. Valentina Lucks called me and I agreed to see the patient in the office for evaluation.  The patient says that he feels much better than he did in April.  He still has mild right chest discomfort at times.  He still has some shortness of breath with exertion such as walking up an incline or stairs.  He has no problems on level ground.  He denies any fever or chills.  He said no cough or sputum production.  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  He is a non-smoker      Past Medical History:  Diagnosis Date   Anxiety    Diabetes mellitus without complication (HCC)    GERD (gastroesophageal reflux disease)    Hepatitis 1980'S   HEPATITIS B   High cholesterol    Hypertension    Pneumonia          Past Surgical History:  Procedure Laterality Date   APPENDECTOMY  AGE 26 OR 13   COLONOSCOPY WITH PROPOFOL N/A 08/13/2013   Procedure: COLONOSCOPY WITH PROPOFOL;  Surgeon: Charolett BumpersMartin K Johnson, MD;  Location: [facility];  Service: Endoscopy;  Laterality: N/A;   EYE SURGERY Left    retinal detachment and cataract surgery   HERNIA REPAIR  89 WEEKS OLD   IR THORACENTESIS ASP PLEURAL SPACE W/IMG GUIDE  03/07/2019    History reviewed. No pertinent family history.  Social History Social History   Tobacco Use   Smoking status: Former Smoker    Packs/day: 2.00    Years: 10.00    Pack years: 20.00    Types: Cigarettes   Smokeless tobacco: Never Used  Substance Use Topics   Alcohol use: Yes    Comment: rare   Drug use: No    Comment: QUIT SMOKING 25 YRS AGO          Current Outpatient Medications  Medication Sig Dispense Refill   ALPRAZolam (XANAX) 0.25  MG tablet Take 0.25 mg by mouth every 6 (six) hours as needed for anxiety.     amLODipine-valsartan (EXFORGE) 5-160 MG tablet Take 1 tablet by mouth daily.     atorvastatin (LIPITOR) 20 MG tablet Take 20 mg by mouth every evening.     EPINEPHrine 0.3 mg/0.3 mL IJ SOAJ injection Inject 0.3 mg into the muscle as needed for anaphylaxis.      finasteride (PROSCAR) 5 MG tablet Take 5 mg by mouth every evening.     fluticasone (FLONASE) 50 MCG/ACT nasal spray Place 2 sprays into both nostrils every evening.      glipiZIDE (GLUCOTROL) 5 MG tablet Take 5 mg by mouth 2 (two) times a day.      ibuprofen (ADVIL,MOTRIN) 200 MG tablet Take 400-600 mg by mouth daily as needed for fever or moderate pain.     imipramine (TOFRANIL-PM) 75 MG capsule Take 75 mg by mouth at bedtime.     levalbuterol (XOPENEX HFA) 45 MCG/ACT inhaler Inhale 1-2 puffs into the lungs every 4 (four) hours as needed for wheezing or shortness of breath.      levocetirizine (XYZAL) 5 MG tablet Take 5 mg by mouth every evening.     Menthol, Topical Analgesic, (BIOFREEZE EX) Apply 1 application topically 4 (four) times daily as needed (pain.).      metFORMIN (GLUCOPHAGE-XR) 500 MG 24 hr tablet Take 1,000 mg by mouth 2 (two) times daily.     MYRBETRIQ 50 MG TB24 tablet Take 50 mg by mouth every evening.     Spacer/Aero-Holding Chambers (AEROCHAMBER PLUS FLO-VU LARGE) MISC See admin instructions.     zolpidem (AMBIEN) 10 MG tablet Take 10 mg by mouth at bedtime.     aspirin EC 81 MG tablet Take 81 mg by mouth daily.     chlorpheniramine (CHLOR-TRIMETON) 4 MG tablet Take 4 mg by mouth 3 (three) times daily as needed for allergies.     Dulaglutide (TRULICITY) 1.5 MG/0.5ML SOPN Inject 1.5 mg into the skin every Friday.     famotidine (PEPCID) 20 MG tablet Take 20 mg by mouth 2 (two) times daily.     naproxen sodium (ALEVE) 220 MG tablet Take 220-440 mg by mouth 2 (two) times daily as  needed (pain.).     No current facility-administered medications for this visit.          Allergies  Allergen Reactions   Penicillins Other (See Comments)    Childhood Allergy Did it involve swelling of the face/tongue/throat, SOB, or low BP? UNK Did it involve sudden or severe rash/hives, skin peeling, or any reaction on the inside of  your mouth or nose? UNK Did you need to seek medical attention at a hospital or doctor's office? UNK When did it last happen?childhood  If all above answers are NO, may proceed with cephalosporin use.     Review of Systems  Constitutional: Negative for activity change, appetite change, chills, fatigue, fever and unexpected weight change.  HENT: Negative.   Eyes: Negative.   Respiratory: Positive for chest tightness and shortness of breath. Negative for cough.   Cardiovascular: Negative.   Gastrointestinal: Negative.   Endocrine: Negative.   Genitourinary: Positive for frequency.  Musculoskeletal: Negative.   Allergic/Immunologic: Negative.   Neurological: Negative.   Hematological: Negative.   Psychiatric/Behavioral: Negative.     BP (!) 160/80    Pulse 100    Temp 97.9 F (36.6 C) (Skin)    Resp 20    Ht 5' 10.5" (1.791 m)    Wt 246 lb (111.6 kg)    SpO2 92% Comment: RA   BMI 34.80 kg/m  Physical Exam Constitutional:      Appearance: Normal appearance. He is obese.  HENT:     Head: Normocephalic and atraumatic.  Eyes:     Extraocular Movements: Extraocular movements intact.     Conjunctiva/sclera: Conjunctivae normal.     Pupils: Pupils are equal, round, and reactive to light.  Neck:     Musculoskeletal: No neck rigidity or muscular tenderness.  Cardiovascular:     Rate and Rhythm: Normal rate and regular rhythm.     Heart sounds: Normal heart sounds. No murmur.  Pulmonary:     Effort: Pulmonary effort is normal.     Comments: Decreased breath sounds over the right lower lobe Abdominal:     Palpations:  Abdomen is soft.     Tenderness: There is no abdominal tenderness.  Musculoskeletal:        General: No swelling.  Lymphadenopathy:     Cervical: No cervical adenopathy.  Skin:    General: Skin is warm and dry.  Neurological:     General: No focal deficit present.     Mental Status: He is alert and oriented to person, place, and time.  Psychiatric:        Mood and Affect: Mood normal.        Behavior: Behavior normal.        Thought Content: Thought content normal.        Judgment: Judgment normal.      Diagnostic Tests:  CLINICAL DATA: Loculated right-sided pleural effusion  EXAM: CT CHEST WITHOUT CONTRAST  TECHNIQUE: Multidetector CT imaging of the chest was performed following the standard protocol without IV contrast.  COMPARISON: CT chest dated 02/11/2019  FINDINGS: Cardiovascular: Aortic calcifications are noted. Heart size is within normal limits. There are coronary and aortic valve calcifications. No pericardial effusion.  Mediastinum/Nodes: No enlarged mediastinal or axillary lymph nodes. Thyroid gland, trachea, and esophagus demonstrate no significant findings.  Lungs/Pleura: There is a 5 mm pulmonary nodule in the right upper lobe (axial series 8, image 46). There are multiple small pulmonary nodules at the left lung base measuring up to approximately 6 mm. There are 5 mm pulmonary nodules in the right lower lobe. There is a moderate-sized chronic appearing right-sided pleural effusion. There is thickening of the surrounding pleura. There is adjacent airspace atelectasis versus consolidation.  Upper Abdomen: No acute abnormality.  Musculoskeletal: No chest wall mass or suspicious bone lesions identified.  IMPRESSION: 1. Chronic appearing right-sided pleural effusion with associated pleural thickening. Correlation with recent  thoracentesis results is recommended as an underlying empyema cannot be excluded on this exam. There is  adjacent atelectasis versus consolidation. 2. Multiple pulmonary nodules bilaterally measuring up to approximately 6 mm, better visualized on today's exam. Non-contrast chest CT at 3-6 months is recommended. If the nodules are stable at time of repeat CT, then future CT at 18-24 months (from today's scan) is considered optional for low-risk patients, but is recommended for high-risk patients. This recommendation follows the consensus statement: Guidelines for Management of Incidental Pulmonary Nodules Detected on CT Images: From the Fleischner Society 2017; Radiology 2017; 284:228-243. 3.  Aortic Atherosclerosis (ICD10-I70.0).   Electronically Signed By: Katherine Mantle M.D. On: 03/28/2019 16:20  Impression:  This 68 year old gentleman presented in early April with acute onset of right sided pleuritic chest pain and shortness of breath with cough and leukocytosis.  A CT scan of the chest was consistent with right lower lobe pneumonia and a parapneumonic effusion.  Despite treatment with doxycycline he developed a progressively enlarging right pleural effusion with associated right lower lobe atelectasis.  He is now about 6 weeks after presentation without effusion and it is still large with significant collapse of the right lower lobe.  There is significant pleural thickening around the fluid collection.  Although this fluid collection has been present for about 6 weeks I think he is probably still in the window where it may be suitable for drainage and decortication of the lung to allow reexpansion of the right lower lobe.  The other option would be to leave it alone although I think he would have a chronically trapped right lower lobe that may cause him persistent shortness of breath.  There is some risk with the surgical procedure because it would require a small thoracotomy and decortication of the lung which may result in air leaks from the surface of the lung that could prolong  his hospital course.  It is also possible that the right lower lobe may be chronically trapped by scar tissue and not reexpand to fill the space.  I discussed the options of surgical versus nonsurgical treatment with him and the pros and cons of both approaches.  I think surgical treatment is the best option at this time.  I discussed the operative procedure of right thoracotomy for drainage of the loculated pleural fluid collection and decortication of the lung with him including alternatives, benefits, and risks including but not limited to bleeding, infection, prolonged air leak, persistent pleural space complications, and failure of the right lower lobe to reexpand.  He understands all this and agrees to proceed.  Plan:  Right thoracotomy with drainage of the loculated pleural fluid collection and decortication of the lung.     Alleen Borne, MD [facility] [phone]

## 2019-04-03 ENCOUNTER — Other Ambulatory Visit: Payer: Self-pay

## 2019-04-03 ENCOUNTER — Encounter (HOSPITAL_COMMUNITY): Payer: Self-pay

## 2019-04-03 ENCOUNTER — Inpatient Hospital Stay (HOSPITAL_COMMUNITY)
Admission: RE | Admit: 2019-04-03 | Discharge: 2019-04-06 | DRG: 165 | Disposition: A | Discharge status: Home / Self Care | Payer: Medicare Other | Attending: Surgery | Admitting: Surgery

## 2019-04-03 ENCOUNTER — Encounter (HOSPITAL_COMMUNITY)
Admission: RE | Disposition: A | Discharge status: Home / Self Care | Payer: Self-pay | Source: Home / Self Care | Attending: Surgery

## 2019-04-03 ENCOUNTER — Inpatient Hospital Stay (HOSPITAL_COMMUNITY): Payer: Medicare Other

## 2019-04-03 ENCOUNTER — Inpatient Hospital Stay (HOSPITAL_COMMUNITY): Payer: Medicare Other | Admitting: Anesthesiology

## 2019-04-03 DIAGNOSIS — Z7984 Long term (current) use of oral hypoglycemic drugs: Secondary | ICD-10-CM | POA: Diagnosis not present

## 2019-04-03 DIAGNOSIS — Z8619 Personal history of other infectious and parasitic diseases: Secondary | ICD-10-CM

## 2019-04-03 DIAGNOSIS — E7800 Pure hypercholesterolemia, unspecified: Secondary | ICD-10-CM | POA: Diagnosis present

## 2019-04-03 DIAGNOSIS — Z87891 Personal history of nicotine dependence: Secondary | ICD-10-CM

## 2019-04-03 DIAGNOSIS — Z7982 Long term (current) use of aspirin: Secondary | ICD-10-CM | POA: Diagnosis not present

## 2019-04-03 DIAGNOSIS — F419 Anxiety disorder, unspecified: Secondary | ICD-10-CM | POA: Diagnosis present

## 2019-04-03 DIAGNOSIS — Z7951 Long term (current) use of inhaled steroids: Secondary | ICD-10-CM

## 2019-04-03 DIAGNOSIS — E119 Type 2 diabetes mellitus without complications: Secondary | ICD-10-CM | POA: Diagnosis present

## 2019-04-03 DIAGNOSIS — I10 Essential (primary) hypertension: Secondary | ICD-10-CM | POA: Diagnosis present

## 2019-04-03 DIAGNOSIS — J869 Pyothorax without fistula: Secondary | ICD-10-CM

## 2019-04-03 DIAGNOSIS — K219 Gastro-esophageal reflux disease without esophagitis: Secondary | ICD-10-CM | POA: Diagnosis present

## 2019-04-03 DIAGNOSIS — E785 Hyperlipidemia, unspecified: Secondary | ICD-10-CM | POA: Diagnosis present

## 2019-04-03 DIAGNOSIS — I700 Atherosclerosis of aorta: Secondary | ICD-10-CM | POA: Diagnosis present

## 2019-04-03 DIAGNOSIS — Z9889 Other specified postprocedural states: Secondary | ICD-10-CM

## 2019-04-03 DIAGNOSIS — Z9689 Presence of other specified functional implants: Secondary | ICD-10-CM

## 2019-04-03 DIAGNOSIS — Z880 Allergy status to penicillin: Secondary | ICD-10-CM | POA: Diagnosis not present

## 2019-04-03 HISTORY — DX: Anxiety disorder, unspecified: F41.9

## 2019-04-03 HISTORY — PX: EMPYEMA DRAINAGE: SHX5097

## 2019-04-03 HISTORY — PX: PAIN PUMP IMPLANTATION: SHX330

## 2019-04-03 HISTORY — PX: THORACOTOMY: SHX5074

## 2019-04-03 HISTORY — PX: DECORTICATION: SHX5101

## 2019-04-03 HISTORY — DX: Pneumonia, unspecified organism: J18.9

## 2019-04-03 LAB — URINALYSIS, ROUTINE W REFLEX MICROSCOPIC
Bilirubin Urine: NEGATIVE
Glucose, UA: NEGATIVE mg/dL
Hgb urine dipstick: NEGATIVE
Ketones, ur: NEGATIVE mg/dL
Leukocytes,Ua: NEGATIVE
Nitrite: NEGATIVE
Protein, ur: NEGATIVE mg/dL
Specific Gravity, Urine: 1.018 (ref 1.005–1.030)
pH: 5 (ref 5.0–8.0)

## 2019-04-03 LAB — CBC
HCT: 40.4 % (ref 39.0–52.0)
Hemoglobin: 13.2 g/dL (ref 13.0–17.0)
MCH: 28.7 pg (ref 26.0–34.0)
MCHC: 32.7 g/dL (ref 30.0–36.0)
MCV: 87.8 fL (ref 80.0–100.0)
Platelets: 349 10*3/uL (ref 150–400)
RBC: 4.6 MIL/uL (ref 4.22–5.81)
RDW: 12.7 % (ref 11.5–15.5)
WBC: 11.8 10*3/uL — ABNORMAL HIGH (ref 4.0–10.5)
nRBC: 0 % (ref 0.0–0.2)

## 2019-04-03 LAB — COMPREHENSIVE METABOLIC PANEL
ALT: 17 U/L (ref 0–44)
AST: 16 U/L (ref 15–41)
Albumin: 3.6 g/dL (ref 3.5–5.0)
Alkaline Phosphatase: 138 U/L — ABNORMAL HIGH (ref 38–126)
Anion gap: 16 — ABNORMAL HIGH (ref 5–15)
BUN: 14 mg/dL (ref 8–23)
CO2: 18 mmol/L — ABNORMAL LOW (ref 22–32)
Calcium: 9.5 mg/dL (ref 8.9–10.3)
Chloride: 104 mmol/L (ref 98–111)
Creatinine, Ser: 0.78 mg/dL (ref 0.61–1.24)
GFR calc Af Amer: >60 mL/min (ref >60)
GFR calc non Af Amer: >60 mL/min (ref >60)
Glucose, Bld: 147 mg/dL — ABNORMAL HIGH (ref 70–99)
Potassium: 4.3 mmol/L (ref 3.5–5.1)
Sodium: 138 mmol/L (ref 135–145)
Total Bilirubin: 0.5 mg/dL (ref 0.3–1.2)
Total Protein: 7.7 g/dL (ref 6.5–8.1)

## 2019-04-03 LAB — BLOOD GAS, ARTERIAL
Acid-base deficit: 2 mmol/L (ref 0.0–2.0)
Bicarbonate: 22.5 mmol/L (ref 20.0–28.0)
Drawn by: 470591
FIO2: 0.21
O2 Saturation: 98.3 %
Patient temperature: 98.6
pCO2 arterial: 39.8 mmHg (ref 32.0–48.0)
pH, Arterial: 7.37 (ref 7.350–7.450)
pO2, Arterial: 146 mmHg — ABNORMAL HIGH (ref 83.0–108.0)

## 2019-04-03 LAB — HEMOGLOBIN A1C
Hgb A1c MFr Bld: 7.5 % — ABNORMAL HIGH (ref 4.8–5.6)
Mean Plasma Glucose: 168.55 mg/dL

## 2019-04-03 LAB — GLUCOSE, CAPILLARY
Glucose-Capillary: 146 mg/dL — ABNORMAL HIGH (ref 70–99)
Glucose-Capillary: 165 mg/dL — ABNORMAL HIGH (ref 70–99)
Glucose-Capillary: 217 mg/dL — ABNORMAL HIGH (ref 70–99)
Glucose-Capillary: 230 mg/dL — ABNORMAL HIGH (ref 70–99)
Glucose-Capillary: 259 mg/dL — ABNORMAL HIGH (ref 70–99)

## 2019-04-03 LAB — TYPE AND SCREEN
ABO/RH(D): A POS
Antibody Screen: NEGATIVE

## 2019-04-03 LAB — PROTIME-INR
INR: 1 (ref 0.8–1.2)
Prothrombin Time: 13.1 seconds (ref 11.4–15.2)

## 2019-04-03 LAB — APTT: aPTT: 29 seconds (ref 24–36)

## 2019-04-03 LAB — SURGICAL PCR SCREEN
MRSA, PCR: NEGATIVE
Staphylococcus aureus: NEGATIVE

## 2019-04-03 LAB — ABO/RH: ABO/RH(D): A POS

## 2019-04-03 SURGERY — THORACOTOMY, MAJOR
Anesthesia: General | Site: Chest | Laterality: Right

## 2019-04-03 MED ORDER — FLUTICASONE PROPIONATE 50 MCG/ACT NA SUSP
2.0000 | Freq: Every evening | NASAL | Status: DC
  Administered 2019-04-04: 2 via NASAL
  Filled 2019-04-03: qty 16

## 2019-04-03 MED ORDER — SUGAMMADEX SODIUM 200 MG/2ML IV SOLN
INTRAVENOUS | Status: DC | PRN
  Administered 2019-04-03: 300 mg via INTRAVENOUS

## 2019-04-03 MED ORDER — 0.9 % SODIUM CHLORIDE (POUR BTL) OPTIME
TOPICAL | Status: DC | PRN
  Administered 2019-04-03: 08:00:00 2000 mL

## 2019-04-03 MED ORDER — MIDAZOLAM HCL 5 MG/5ML IJ SOLN
INTRAMUSCULAR | Status: DC | PRN
  Administered 2019-04-03: 2 mg via INTRAVENOUS

## 2019-04-03 MED ORDER — LIDOCAINE 2% (20 MG/ML) 5 ML SYRINGE
INTRAMUSCULAR | Status: DC | PRN
  Administered 2019-04-03: 100 mg via INTRAVENOUS

## 2019-04-03 MED ORDER — FENTANYL CITRATE (PF) 250 MCG/5ML IJ SOLN
INTRAMUSCULAR | Status: AC
  Filled 2019-04-03: qty 5

## 2019-04-03 MED ORDER — LACTATED RINGERS IV SOLN
INTRAVENOUS | Status: DC | PRN
  Administered 2019-04-03 (×3): via INTRAVENOUS

## 2019-04-03 MED ORDER — SODIUM CHLORIDE 0.9% FLUSH: 9.0000 mL | INTRAVENOUS | Status: DC | PRN

## 2019-04-03 MED ORDER — ALPRAZOLAM 0.25 MG PO TABS
0.2500 mg | ORAL_TABLET | Freq: Four times a day (QID) | ORAL | Status: DC | PRN
  Administered 2019-04-03 – 2019-04-05 (×4): 0.25 mg via ORAL
  Filled 2019-04-03 (×5): qty 1

## 2019-04-03 MED ORDER — DEXAMETHASONE SODIUM PHOSPHATE 10 MG/ML IJ SOLN
INTRAMUSCULAR | Status: DC | PRN
  Administered 2019-04-03: 10 mg via INTRAVENOUS

## 2019-04-03 MED ORDER — VANCOMYCIN HCL 10 G IV SOLR
1500.0000 mg | INTRAVENOUS | Status: AC
  Administered 2019-04-03: 1500 mg via INTRAVENOUS
  Filled 2019-04-03 (×2): qty 1500

## 2019-04-03 MED ORDER — NALOXONE HCL 0.4 MG/ML IJ SOLN: 0.4000 mg | INTRAMUSCULAR | Status: DC | PRN

## 2019-04-03 MED ORDER — ORAL CARE MOUTH RINSE
15.0000 mL | Freq: Two times a day (BID) | OROMUCOSAL | Status: DC
  Administered 2019-04-03 – 2019-04-05 (×5): 15 mL via OROMUCOSAL

## 2019-04-03 MED ORDER — BUPIVACAINE HCL (PF) 0.5 % IJ SOLN
INTRAMUSCULAR | Status: DC | PRN
  Administered 2019-04-03: 5 mL

## 2019-04-03 MED ORDER — FENTANYL CITRATE (PF) 250 MCG/5ML IJ SOLN
INTRAMUSCULAR | Status: DC | PRN
  Administered 2019-04-03: 100 ug via INTRAVENOUS
  Administered 2019-04-03 (×3): 50 ug via INTRAVENOUS
  Administered 2019-04-03: 100 ug via INTRAVENOUS
  Administered 2019-04-03: 50 ug via INTRAVENOUS

## 2019-04-03 MED ORDER — IMIPRAMINE PAMOATE 75 MG PO CAPS
75.0000 mg | ORAL_CAPSULE | Freq: Every day | ORAL | Status: DC
  Administered 2019-04-03 – 2019-04-05 (×2): 75 mg via ORAL
  Filled 2019-04-03 (×4): qty 1

## 2019-04-03 MED ORDER — EPHEDRINE 5 MG/ML INJ
INTRAVENOUS | Status: AC
  Filled 2019-04-03: qty 10

## 2019-04-03 MED ORDER — SODIUM CHLORIDE 0.9 % IV SOLN
INTRAVENOUS | Status: DC
  Administered 2019-04-03 – 2019-04-04 (×3): via INTRAVENOUS

## 2019-04-03 MED ORDER — DIPHENHYDRAMINE HCL 50 MG/ML IJ SOLN: 12.5000 mg | Freq: Four times a day (QID) | INTRAMUSCULAR | Status: DC | PRN

## 2019-04-03 MED ORDER — METOCLOPRAMIDE HCL 5 MG/ML IJ SOLN
10.0000 mg | Freq: Four times a day (QID) | INTRAMUSCULAR | Status: AC
  Administered 2019-04-03 – 2019-04-05 (×8): 10 mg via INTRAVENOUS
  Filled 2019-04-03 (×7): qty 2

## 2019-04-03 MED ORDER — MORPHINE SULFATE 2 MG/ML IV SOLN
INTRAVENOUS | Status: DC
  Administered 2019-04-03: 23:00:00 via INTRAVENOUS
  Administered 2019-04-03: 9 mg via INTRAVENOUS
  Administered 2019-04-03: 12:00:00 via INTRAVENOUS
  Administered 2019-04-03: 18 mg via INTRAVENOUS
  Administered 2019-04-04: 22.5 mg via INTRAVENOUS
  Administered 2019-04-04: 12:00:00 via INTRAVENOUS
  Administered 2019-04-04: 16.5 mg via INTRAVENOUS
  Administered 2019-04-04: 10.5 mg via INTRAVENOUS
  Administered 2019-04-05: 01:00:00 via INTRAVENOUS
  Administered 2019-04-05: 15 mg via INTRAVENOUS
  Administered 2019-04-05: 16.5 mg via INTRAVENOUS
  Filled 2019-04-03 (×5): qty 30

## 2019-04-03 MED ORDER — ATORVASTATIN CALCIUM 10 MG PO TABS
20.0000 mg | ORAL_TABLET | Freq: Every evening | ORAL | Status: DC
  Administered 2019-04-03 – 2019-04-05 (×3): 20 mg via ORAL
  Filled 2019-04-03 (×4): qty 2

## 2019-04-03 MED ORDER — FINASTERIDE 5 MG PO TABS
5.0000 mg | ORAL_TABLET | Freq: Every evening | ORAL | Status: DC
  Administered 2019-04-03 – 2019-04-05 (×3): 5 mg via ORAL
  Filled 2019-04-03 (×3): qty 1

## 2019-04-03 MED ORDER — DIPHENHYDRAMINE HCL 12.5 MG/5ML PO ELIX: 12.5000 mg | ORAL_SOLUTION | Freq: Four times a day (QID) | ORAL | Status: DC | PRN

## 2019-04-03 MED ORDER — MIDAZOLAM HCL 2 MG/2ML IJ SOLN
0.5000 mg | INTRAMUSCULAR | Status: AC | PRN
  Administered 2019-04-03: 2 mg via INTRAVENOUS

## 2019-04-03 MED ORDER — HYDROMORPHONE HCL 1 MG/ML IJ SOLN
0.2500 mg | INTRAMUSCULAR | Status: DC | PRN
  Administered 2019-04-03: 0.5 mg via INTRAVENOUS

## 2019-04-03 MED ORDER — OXYCODONE HCL 5 MG PO TABS
5.0000 mg | ORAL_TABLET | ORAL | Status: DC | PRN
  Administered 2019-04-05: 10 mg via ORAL
  Filled 2019-04-03: qty 2

## 2019-04-03 MED ORDER — MIDAZOLAM HCL 2 MG/2ML IJ SOLN
INTRAMUSCULAR | Status: AC
  Filled 2019-04-03: qty 2

## 2019-04-03 MED ORDER — BUPIVACAINE HCL (PF) 0.5 % IJ SOLN
INTRAMUSCULAR | Status: AC
  Filled 2019-04-03: qty 10

## 2019-04-03 MED ORDER — ACETAMINOPHEN 10 MG/ML IV SOLN: 1000.0000 mg | Freq: Once | INTRAVENOUS | Status: DC | PRN

## 2019-04-03 MED ORDER — EPHEDRINE SULFATE 50 MG/ML IJ SOLN
INTRAMUSCULAR | Status: DC | PRN
  Administered 2019-04-03: 2.5 mg via INTRAVENOUS

## 2019-04-03 MED ORDER — ONDANSETRON HCL 4 MG/2ML IJ SOLN: 4.0000 mg | Freq: Four times a day (QID) | INTRAMUSCULAR | Status: DC | PRN

## 2019-04-03 MED ORDER — PROMETHAZINE HCL 25 MG/ML IJ SOLN: 6.2500 mg | INTRAMUSCULAR | Status: DC | PRN

## 2019-04-03 MED ORDER — PHENYLEPHRINE 40 MCG/ML (10ML) SYRINGE FOR IV PUSH (FOR BLOOD PRESSURE SUPPORT)
PREFILLED_SYRINGE | INTRAVENOUS | Status: DC | PRN
  Administered 2019-04-03: 80 ug via INTRAVENOUS
  Administered 2019-04-03: 60 ug via INTRAVENOUS
  Administered 2019-04-03: 80 ug via INTRAVENOUS
  Administered 2019-04-03: 60 ug via INTRAVENOUS
  Administered 2019-04-03: 80 ug via INTRAVENOUS
  Administered 2019-04-03: 40 ug via INTRAVENOUS

## 2019-04-03 MED ORDER — AMLODIPINE BESYLATE 5 MG PO TABS
5.0000 mg | ORAL_TABLET | Freq: Every day | ORAL | Status: DC
  Administered 2019-04-04 – 2019-04-06 (×3): 5 mg via ORAL
  Filled 2019-04-03 (×3): qty 1

## 2019-04-03 MED ORDER — ONDANSETRON HCL 4 MG/2ML IJ SOLN
INTRAMUSCULAR | Status: DC | PRN
  Administered 2019-04-03: 4 mg via INTRAVENOUS

## 2019-04-03 MED ORDER — KETOROLAC TROMETHAMINE 15 MG/ML IJ SOLN
15.0000 mg | Freq: Four times a day (QID) | INTRAMUSCULAR | Status: DC | PRN
  Administered 2019-04-03 – 2019-04-06 (×6): 15 mg via INTRAVENOUS
  Filled 2019-04-03 (×5): qty 1

## 2019-04-03 MED ORDER — LABETALOL HCL 5 MG/ML IV SOLN
INTRAVENOUS | Status: AC
  Filled 2019-04-03: qty 4

## 2019-04-03 MED ORDER — SUCCINYLCHOLINE CHLORIDE 20 MG/ML IJ SOLN
INTRAMUSCULAR | Status: DC | PRN
  Administered 2019-04-03: 120 mg via INTRAVENOUS

## 2019-04-03 MED ORDER — BISACODYL 5 MG PO TBEC
10.0000 mg | DELAYED_RELEASE_TABLET | Freq: Every day | ORAL | Status: DC
  Administered 2019-04-04: 10 mg via ORAL
  Filled 2019-04-03: qty 2

## 2019-04-03 MED ORDER — POTASSIUM CHLORIDE 10 MEQ/50ML IV SOLN: 10.0000 meq | Freq: Every day | INTRAVENOUS | Status: DC | PRN

## 2019-04-03 MED ORDER — MIRABEGRON ER 50 MG PO TB24
50.0000 mg | ORAL_TABLET | Freq: Every evening | ORAL | Status: DC
  Administered 2019-04-03 – 2019-04-05 (×3): 50 mg via ORAL
  Filled 2019-04-03 (×5): qty 1

## 2019-04-03 MED ORDER — MIDAZOLAM HCL 2 MG/2ML IJ SOLN
2.0000 mg | Freq: Once | INTRAMUSCULAR | Status: AC
  Administered 2019-04-03: 1 mg via INTRAVENOUS

## 2019-04-03 MED ORDER — FAMOTIDINE 20 MG PO TABS
20.0000 mg | ORAL_TABLET | Freq: Two times a day (BID) | ORAL | Status: DC
  Administered 2019-04-03 – 2019-04-06 (×7): 20 mg via ORAL
  Filled 2019-04-03 (×7): qty 1

## 2019-04-03 MED ORDER — HYDRALAZINE HCL 20 MG/ML IJ SOLN
INTRAMUSCULAR | Status: AC
  Filled 2019-04-03: qty 1

## 2019-04-03 MED ORDER — LABETALOL HCL 5 MG/ML IV SOLN
INTRAVENOUS | Status: DC | PRN
  Administered 2019-04-03: 5 mg via INTRAVENOUS

## 2019-04-03 MED ORDER — BUPIVACAINE ON-Q PAIN PUMP (FOR ORDER SET NO CHG)
INJECTION | Status: DC
  Filled 2019-04-03: qty 1

## 2019-04-03 MED ORDER — LEVALBUTEROL HCL 0.63 MG/3ML IN NEBU: 0.6300 mg | INHALATION_SOLUTION | Freq: Four times a day (QID) | RESPIRATORY_TRACT | Status: DC | PRN

## 2019-04-03 MED ORDER — LEVOFLOXACIN IN D5W 750 MG/150ML IV SOLN
750.0000 mg | INTRAVENOUS | Status: AC
  Administered 2019-04-03 – 2019-04-04 (×2): 750 mg via INTRAVENOUS
  Filled 2019-04-03 (×2): qty 150

## 2019-04-03 MED ORDER — BUPIVACAINE 0.5 % ON-Q PUMP SINGLE CATH 400 ML
400.0000 mL | INJECTION | Status: DC
  Filled 2019-04-03 (×2): qty 400

## 2019-04-03 MED ORDER — LEVALBUTEROL HCL 0.63 MG/3ML IN NEBU
0.6300 mg | INHALATION_SOLUTION | Freq: Four times a day (QID) | RESPIRATORY_TRACT | Status: DC
  Administered 2019-04-03: 0.63 mg via RESPIRATORY_TRACT
  Filled 2019-04-03: qty 3

## 2019-04-03 MED ORDER — ACETAMINOPHEN 500 MG PO TABS
1000.0000 mg | ORAL_TABLET | Freq: Four times a day (QID) | ORAL | Status: DC
  Administered 2019-04-03 – 2019-04-06 (×13): 1000 mg via ORAL
  Filled 2019-04-03 (×13): qty 2

## 2019-04-03 MED ORDER — HYDROMORPHONE HCL 1 MG/ML IJ SOLN
INTRAMUSCULAR | Status: AC
  Filled 2019-04-03: qty 1

## 2019-04-03 MED ORDER — SENNOSIDES-DOCUSATE SODIUM 8.6-50 MG PO TABS
1.0000 | ORAL_TABLET | Freq: Every day | ORAL | Status: DC
  Administered 2019-04-03 – 2019-04-05 (×3): 1 via ORAL
  Filled 2019-04-03 (×3): qty 1

## 2019-04-03 MED ORDER — KETOROLAC TROMETHAMINE 15 MG/ML IJ SOLN
INTRAMUSCULAR | Status: AC
  Administered 2019-04-03: 15 mg via INTRAVENOUS
  Filled 2019-04-03: qty 1

## 2019-04-03 MED ORDER — PROPOFOL 10 MG/ML IV BOLUS
INTRAVENOUS | Status: DC | PRN
  Administered 2019-04-03: 50 mg via INTRAVENOUS
  Administered 2019-04-03: 200 mg via INTRAVENOUS

## 2019-04-03 MED ORDER — CETIRIZINE HCL 10 MG PO TABS
10.0000 mg | ORAL_TABLET | Freq: Every evening | ORAL | Status: DC
  Administered 2019-04-03 – 2019-04-04 (×2): 10 mg via ORAL
  Filled 2019-04-03 (×5): qty 1

## 2019-04-03 MED ORDER — PROPOFOL 10 MG/ML IV BOLUS
INTRAVENOUS | Status: AC
  Filled 2019-04-03: qty 40

## 2019-04-03 MED ORDER — INSULIN ASPART 100 UNIT/ML ~~LOC~~ SOLN
0.0000 [IU] | SUBCUTANEOUS | Status: DC
  Administered 2019-04-03 (×2): 12 [IU] via SUBCUTANEOUS
  Administered 2019-04-03: 8 [IU] via SUBCUTANEOUS
  Administered 2019-04-04: 4 [IU] via SUBCUTANEOUS
  Administered 2019-04-04 (×4): 2 [IU] via SUBCUTANEOUS
  Administered 2019-04-04: 8 [IU] via SUBCUTANEOUS
  Administered 2019-04-05: 2 [IU] via SUBCUTANEOUS

## 2019-04-03 MED ORDER — KETAMINE HCL 50 MG/5ML IJ SOSY
PREFILLED_SYRINGE | INTRAMUSCULAR | Status: AC
  Filled 2019-04-03: qty 5

## 2019-04-03 MED ORDER — ROCURONIUM BROMIDE 10 MG/ML (PF) SYRINGE
PREFILLED_SYRINGE | INTRAVENOUS | Status: DC | PRN
  Administered 2019-04-03: 30 mg via INTRAVENOUS
  Administered 2019-04-03: 70 mg via INTRAVENOUS

## 2019-04-03 MED ORDER — KETAMINE HCL 50 MG/ML IJ SOLN
INTRAMUSCULAR | Status: DC | PRN
  Administered 2019-04-03: 30 mg via INTRAMUSCULAR
  Administered 2019-04-03: 20 mg via INTRAMUSCULAR

## 2019-04-03 MED ORDER — CHLORHEXIDINE GLUCONATE CLOTH 2 % EX PADS
6.0000 | MEDICATED_PAD | Freq: Every day | CUTANEOUS | Status: DC
  Administered 2019-04-03 – 2019-04-04 (×2): 6 via TOPICAL

## 2019-04-03 MED ORDER — MUPIROCIN 2 % EX OINT
1.0000 "application " | TOPICAL_OINTMENT | Freq: Once | CUTANEOUS | Status: DC
  Filled 2019-04-03: qty 22

## 2019-04-03 MED ORDER — HYDRALAZINE HCL 20 MG/ML IJ SOLN
10.0000 mg | INTRAMUSCULAR | Status: DC | PRN
  Administered 2019-04-03 – 2019-04-04 (×3): 10 mg via INTRAVENOUS
  Filled 2019-04-03 (×2): qty 1

## 2019-04-03 MED ORDER — ACETAMINOPHEN 160 MG/5ML PO SOLN: 1000.0000 mg | Freq: Four times a day (QID) | ORAL | Status: DC

## 2019-04-03 MED ORDER — IRBESARTAN 150 MG PO TABS
150.0000 mg | ORAL_TABLET | Freq: Every day | ORAL | Status: DC
  Administered 2019-04-04 – 2019-04-06 (×3): 150 mg via ORAL
  Filled 2019-04-03 (×3): qty 1

## 2019-04-03 SURGICAL SUPPLY — 63 items
BENZOIN TINCTURE PRP APPL 2/3 (GAUZE/BANDAGES/DRESSINGS) IMPLANT
BLADE SURG 11 STRL SS (BLADE) ×4 IMPLANT
CANISTER SUCT 3000ML PPV (MISCELLANEOUS) ×4 IMPLANT
CATH KIT ON Q 5IN SLV (PAIN MANAGEMENT) IMPLANT
CATH KIT ON-Q SILVERSOAK 5IN (CATHETERS) ×4 IMPLANT
CATH THORACIC 28FR (CATHETERS) IMPLANT
CATH THORACIC 36FR (CATHETERS) IMPLANT
CATH THORACIC 36FR RT ANG (CATHETERS) IMPLANT
CONN ST 1/4X3/8  BEN (MISCELLANEOUS)
CONN ST 1/4X3/8 BEN (MISCELLANEOUS) IMPLANT
CONN Y 3/8X3/8X3/8  BEN (MISCELLANEOUS)
CONN Y 3/8X3/8X3/8 BEN (MISCELLANEOUS) IMPLANT
CONT SPEC 4OZ CLIKSEAL STRL BL (MISCELLANEOUS) ×8 IMPLANT
DERMABOND ADVANCED (GAUZE/BANDAGES/DRESSINGS) ×1
DERMABOND ADVANCED .7 DNX12 (GAUZE/BANDAGES/DRESSINGS) ×3 IMPLANT
DRAIN CHANNEL 28F RND 3/8 FF (WOUND CARE) IMPLANT
DRAIN CHANNEL 32F RND 10.7 FF (WOUND CARE) ×4 IMPLANT
DRAPE LAPAROSCOPIC ABDOMINAL (DRAPES) ×4 IMPLANT
DRILL BIT 7/64X5 (BIT) ×4 IMPLANT
ELECT BLADE 6.5 EXT (BLADE) ×4 IMPLANT
ELECT REM PT RETURN 9FT ADLT (ELECTROSURGICAL) ×4
ELECTRODE REM PT RTRN 9FT ADLT (ELECTROSURGICAL) ×3 IMPLANT
GAUZE SPONGE 4X4 12PLY STRL (GAUZE/BANDAGES/DRESSINGS) ×4 IMPLANT
GLOVE EUDERMIC 7 POWDERFREE (GLOVE) ×8 IMPLANT
GOWN STRL REUS W/ TWL LRG LVL3 (GOWN DISPOSABLE) ×12 IMPLANT
GOWN STRL REUS W/ TWL XL LVL3 (GOWN DISPOSABLE) ×3 IMPLANT
GOWN STRL REUS W/TWL LRG LVL3 (GOWN DISPOSABLE) ×4
GOWN STRL REUS W/TWL XL LVL3 (GOWN DISPOSABLE) ×1
KIT BASIN OR (CUSTOM PROCEDURE TRAY) ×4 IMPLANT
KIT TURNOVER KIT B (KITS) ×4 IMPLANT
NS IRRIG 1000ML POUR BTL (IV SOLUTION) ×8 IMPLANT
PACK CHEST (CUSTOM PROCEDURE TRAY) ×4 IMPLANT
PAD ARMBOARD 7.5X6 YLW CONV (MISCELLANEOUS) ×8 IMPLANT
PASSER SUT SWANSON 36MM LOOP (INSTRUMENTS) ×4 IMPLANT
SEALANT SURG COSEAL 4ML (VASCULAR PRODUCTS) IMPLANT
SEALANT SURG COSEAL 8ML (VASCULAR PRODUCTS) IMPLANT
SUT PROLENE 3 0 SH DA (SUTURE) IMPLANT
SUT SILK  1 MH (SUTURE) ×2
SUT SILK 1 MH (SUTURE) ×6 IMPLANT
SUT SILK 1 TIES 10X30 (SUTURE) IMPLANT
SUT SILK 2 0 SH (SUTURE) ×4 IMPLANT
SUT SILK 2 0SH CR/8 30 (SUTURE) IMPLANT
SUT SILK 3 0SH CR/8 30 (SUTURE) IMPLANT
SUT VIC AB 1 CTX 36 (SUTURE) ×1
SUT VIC AB 1 CTX36XBRD ANBCTR (SUTURE) ×3 IMPLANT
SUT VIC AB 2-0 CT1 27 (SUTURE)
SUT VIC AB 2-0 CT1 TAPERPNT 27 (SUTURE) IMPLANT
SUT VIC AB 2-0 CTX 36 (SUTURE) ×4 IMPLANT
SUT VIC AB 2-0 UR6 27 (SUTURE) IMPLANT
SUT VIC AB 3-0 MH 27 (SUTURE) IMPLANT
SUT VIC AB 3-0 SH 27 (SUTURE)
SUT VIC AB 3-0 SH 27X BRD (SUTURE) IMPLANT
SUT VIC AB 3-0 X1 27 (SUTURE) ×4 IMPLANT
SUT VICRYL 2 TP 1 (SUTURE) ×4 IMPLANT
SYSTEM SAHARA CHEST DRAIN ATS (WOUND CARE) ×4 IMPLANT
TAPE CLOTH SURG 4X10 WHT LF (GAUZE/BANDAGES/DRESSINGS) ×4 IMPLANT
TIP APPLICATOR SPRAY EXTEND 16 (VASCULAR PRODUCTS) IMPLANT
TOWEL GREEN STERILE (TOWEL DISPOSABLE) ×4 IMPLANT
TOWEL GREEN STERILE FF (TOWEL DISPOSABLE) ×4 IMPLANT
TRAP SPECIMEN MUCOUS 40CC (MISCELLANEOUS) IMPLANT
TRAY FOLEY MTR SLVR 14FR STAT (SET/KITS/TRAYS/PACK) ×4 IMPLANT
TUNNELER SHEATH ON-Q 11GX8 DSP (PAIN MANAGEMENT) ×4 IMPLANT
WATER STERILE IRR 1000ML POUR (IV SOLUTION) ×8 IMPLANT

## 2019-04-03 NOTE — Anesthesia Procedure Notes (Signed)
Central Venous Catheter Insertion Performed by: Eilene Ghazi, MD, anesthesiologist Start/End5/27/2020 7:00 AM, 04/03/2019 7:15 AM Patient location: Pre-op. Preanesthetic checklist: patient identified, IV checked, site marked, risks and benefits discussed, surgical consent, monitors and equipment checked, pre-op evaluation, timeout performed and anesthesia consent Position: Trendelenburg Lidocaine 1% used for infiltration and patient sedated Hand hygiene performed  and maximum sterile barriers used  Catheter size: 8 Fr Total catheter length 16. Central line was placed.Double lumen Procedure performed using ultrasound guided technique. Ultrasound Notes:anatomy identified, needle tip was noted to be adjacent to the nerve/plexus identified, no ultrasound evidence of intravascular and/or intraneural injection and image(s) printed for medical record Attempts: 1 Following insertion, dressing applied, line sutured and Biopatch. Post procedure assessment: blood return through all ports, free fluid flow and no air  Patient tolerated the procedure well with no immediate complications.

## 2019-04-03 NOTE — Op Note (Signed)
04/03/2019 Marc SpeakGlenn L Gates [id number]  Surgeon: Alleen BorneBryan K. Sarabi Sockwell, MD   First Assistant: Gershon CraneWayne Gold, PA-C  Preoperative Diagnosis: Right empyema   Postoperative Diagnosis: Right empyema   Procedure:  1. Right muscle-sparing thoracotomy  2. Drainage of empyema  3. Decortication of the right lower lobe of the lung  Anesthesia: General Endotracheal   Clinical History/Surgical Indication:   This 68 year old gentleman presented in early April with acute onset of right sided pleuritic chest pain and shortness of breath with cough and leukocytosis. A CT scan of the chest was consistent with right lower lobe pneumonia and a parapneumonic effusion. Despite treatment with doxycycline he developed a progressively enlarging right pleural effusion with associated right lower lobe atelectasis. He is now about 6 weeks after presentation without effusion and it is still large with significant collapse of the right lower lobe. There is significant pleural thickening around the fluid collection. Although this fluid collection has been present for about 6 weeks I think he is probably still in the window where it may be suitable for drainage and decortication of the lung to allow reexpansion of the right lower lobe. The other option would be to leave it alone although I think he would have a chronically trapped right lower lobe that may cause him persistent shortness of breath. There is some risk with the surgical procedure because it would require a small thoracotomy and decortication of the lung which may result in air leaks from the surface of the lung that could prolong his hospital course. It is also possible that the right lower lobe may be chronically trapped by scar tissue and not reexpand to fill the space. I discussed the options of surgical versus nonsurgical treatment with him and the pros and cons of both approaches. I think surgical treatment is the best option at this time. I discussed the  operative procedure of right thoracotomy for drainage of the loculated pleural fluid collection and decortication of the lung with him including alternatives, benefits, and risks including but not limited to bleeding, infection, prolonged air leak, persistent pleural space complications, and failure of the right lower lobe to reexpand. He understands all this and agrees to proceed.   Preparation:  The patient was seen in the preoperative holding area and the correct patient, correct operation, correct operative sidewere confirmed with the patient after reviewing the medical record and CT scan. The consent was signed by me. Preoperative antibiotics were given. The right side of the chest was signed by me. The patient was taken back to the operating room and positioned supine on the operating room table. After being placed under general endotracheal anesthesia by the anesthesia team using a double lumen tube a foley catheter was placed. The patient was turned into the left lateral decubitus position. The chest was prepped with betadine soap and solution. A surgical time-out was taken and the correct patient,operative side, and operative procedure were confirmed with the nursing and anesthesia staff.   Operative Procedure:   A short lateral muscle-sparing thoracotomy incision was made and the chest entered through the 7th ICS.  The empyema cavity was entered.  The empyema cavity was filled with a yellowish green multiloculated empyema that was sero-purulent with a thick fibrinous peel over the lower lobe of the lung.  The middle and upper lobes of the lung were completely adherent to the chest wall.  The empyema was completely drained and the right lower lobe of the lung decorticated as much as possible.  The fibrinous  peel over the right lower lobe was very organized after 6 weeks but I removed as much as possible to try to maximize reexpansion of the lobe.  Cultures of the empyema fluid and the fibrinous  peel were sent for Gram stain and culture.  The chest was irrigated with warm saline. Hemostasis was complete. A 32 F Bard drain was placed through a separate stab incision and was positioned within the empyema cavity.  The ribs were reapproximated with # 2 vicryl pericostal sutures.  An On-Q pain catheter was brought through a separate stab incision and positioned along the intercostal space of the thoracotomy and held in place by the pericostal sutures.  The muscles were returned to their normal anatomic position.  The subcutaneous tissue was closed with 2-0 vicryl continuous suture. The skin was closed with 3-0 vicryl subcuticular suture. All sponge, needle, and instrument counts were reported correct at the end of the case. Dry sterile dressings were placed over the incisions and around the chest tubes which were connected to pleurevac suction. The patient was turned supine, extubated,then transported to the PACU in satisfactory and stable condition.

## 2019-04-03 NOTE — Progress Notes (Signed)
CT Surgery  Doing well after R VATS today- OOB to chair No airleak and min drainage Breathing comfortably

## 2019-04-03 NOTE — Interval H&P Note (Signed)
History and Physical Interval Note:  04/03/2019 7:29 AM  Marc Gates  has presented today for surgery, with the diagnosis of RIGHT LOCULATED EFFUSION.  The various methods of treatment have been discussed with the patient and family. After consideration of risks, benefits and other options for treatment, the patient has consented to  Procedure(s): THORACOTOMY MAJOR (Right) EMPYEMA DRAINAGE (Right) DECORTICATION (Right) as a surgical intervention.  The patient's history has been reviewed, patient examined, no change in status, stable for surgery.  I have reviewed the patient's chart and labs.  Questions were answered to the patient's satisfaction.     Alleen Borne

## 2019-04-03 NOTE — Anesthesia Procedure Notes (Signed)
Anesthesia Procedure Image    

## 2019-04-03 NOTE — Brief Op Note (Signed)
04/03/2019  10:13 AM  PATIENT:  Marc Gates  68 y.o. male  PRE-OPERATIVE DIAGNOSIS:  RIGHT LOCULATED EFFUSION  POST-OPERATIVE DIAGNOSIS:  RIGHT LOCULATED EFFUSION  PROCEDURE:  Procedure(s): THORACOTOMY MAJOR (Right) EMPYEMA DRAINAGE (Right) DECORTICATION OF RIGHT LUNG (Right) PLACEMENT OF ON-Q PAIN PUMP  SURGEON:  Surgeon(s) and Role:    * Bartle, Payton Doughty, MD - Primary  PHYSICIAN ASSISTANT: Gershon Crane, PA-C   ANESTHESIA:   general  EBL:  50 mL   BLOOD ADMINISTERED:none  DRAINS: (one 62 F) Blake drain(s) in the empyema cavity   LOCAL MEDICATIONS USED:  NONE  SPECIMEN:  Source of Specimen:  fluid and pleural peel from empyema  DISPOSITION OF SPECIMEN:  micro  COUNTS:  YES  TOURNIQUET:  * No tourniquets in log *  DICTATION: .Note written in EPIC  PLAN OF CARE: Admit to inpatient   PATIENT DISPOSITION:  PACU - hemodynamically stable.   Delay start of Pharmacological VTE agent (>24hrs) due to surgical blood loss or risk of bleeding: yes

## 2019-04-03 NOTE — Anesthesia Preprocedure Evaluation (Signed)
Anesthesia Evaluation  Patient identified by MRN, date of birth, ID band Patient awake    Reviewed: Allergy & Precautions, NPO status , Patient's Chart, lab work & pertinent test results  Airway Mallampati: II  TM Distance: >3 FB Neck ROM: Full    Dental no notable dental hx.    Pulmonary neg pulmonary ROS, former smoker,    breath sounds clear to auscultation + decreased breath sounds      Cardiovascular hypertension, Normal cardiovascular exam Rhythm:Regular Rate:Normal     Neuro/Psych negative neurological ROS  negative psych ROS   GI/Hepatic Neg liver ROS, GERD  ,  Endo/Other  diabetes  Renal/GU negative Renal ROS  negative genitourinary   Musculoskeletal negative musculoskeletal ROS (+)   Abdominal   Peds negative pediatric ROS (+)  Hematology negative hematology ROS (+)   Anesthesia Other Findings   Reproductive/Obstetrics negative OB ROS                             Anesthesia Physical Anesthesia Plan  ASA: III  Anesthesia Plan: General   Post-op Pain Management:    Induction: Intravenous  PONV Risk Score and Plan: 2 and Ondansetron, Dexamethasone and Treatment may vary due to age or medical condition  Airway Management Planned: Oral ETT and Double Lumen EBT  Additional Equipment: Arterial line, CVP and Ultrasound Guidance Line Placement  Intra-op Plan:   Post-operative Plan: Extubation in OR  Informed Consent: I have reviewed the patients History and Physical, chart, labs and discussed the procedure including the risks, benefits and alternatives for the proposed anesthesia with the patient or authorized representative who has indicated his/her understanding and acceptance.     Dental advisory given  Plan Discussed with: CRNA and Surgeon  Anesthesia Plan Comments:         Anesthesia Quick Evaluation

## 2019-04-03 NOTE — Anesthesia Procedure Notes (Signed)
Arterial Line Insertion Start/End5/27/2020 7:00 AM, 04/03/2019 7:10 AM Performed by: CRNA  Patient location: Pre-op. Preanesthetic checklist: patient identified, IV checked, site marked, risks and benefits discussed, surgical consent, monitors and equipment checked, pre-op evaluation, timeout performed and anesthesia consent Lidocaine 1% used for infiltration Left, radial was placed Catheter size: 20 G Hand hygiene performed  and maximum sterile barriers used   Attempts: 2 Procedure performed without using ultrasound guided technique. Following insertion, Biopatch and dressing applied. Post procedure assessment: normal  Patient tolerated the procedure well with no immediate complications.

## 2019-04-03 NOTE — Anesthesia Postprocedure Evaluation (Signed)
Anesthesia Post Note  Patient: Marc Gates  Procedure(s) Performed: THORACOTOMY MAJOR (Right Chest) EMPYEMA DRAINAGE (Right ) DECORTICATION OF RIGHT LUNG (Right ) PLACEMENT OF ON-Q PAIN PUMP     Patient location during evaluation: PACU Anesthesia Type: General Level of consciousness: awake and alert Pain management: pain level controlled Vital Signs Assessment: post-procedure vital signs reviewed and stable Respiratory status: spontaneous breathing, nonlabored ventilation, respiratory function stable and patient connected to nasal cannula oxygen Cardiovascular status: blood pressure returned to baseline and stable Postop Assessment: no apparent nausea or vomiting Anesthetic complications: no    Last Vitals:  Vitals:   04/03/19 1138 04/03/19 1145  BP: (!) 162/97   Pulse: 95 95  Resp: (!) 25 (!) 22  Temp:    SpO2: 95% 92%    Last Pain:  Vitals:   04/03/19 1130  TempSrc:   PainSc: Asleep                 Kenleigh Toback S

## 2019-04-03 NOTE — Anesthesia Procedure Notes (Signed)
Procedure Name: Intubation Date/Time: 04/03/2019 7:50 AM Performed by: Marena Chancy, CRNA Pre-anesthesia Checklist: Patient identified, Emergency Drugs available, Suction available and Patient being monitored Patient Re-evaluated:Patient Re-evaluated prior to induction Oxygen Delivery Method: Circle System Utilized and Circle system utilized Preoxygenation: Pre-oxygenation with 100% oxygen Induction Type: IV induction Ventilation: Mask ventilation without difficulty and Oral airway inserted - appropriate to patient size Laryngoscope Size: Miller and 3 Grade View: Grade II Tube type: Oral Endobronchial tube: Left, Double lumen EBT, EBT position confirmed by auscultation and EBT position confirmed by fiberoptic bronchoscope and 37 Fr Number of attempts: 2 Airway Equipment and Method: Stylet and Oral airway Placement Confirmation: ETT inserted through vocal cords under direct vision,  positive ETCO2 and breath sounds checked- equal and bilateral Tube secured with: Tape Dental Injury: Teeth and Oropharynx as per pre-operative assessment  Comments: 39 DLT was too large to pass through trachea.

## 2019-04-03 NOTE — Transfer of Care (Signed)
Immediate Anesthesia Transfer of Care Note  Patient: Marc Gates  Procedure(s) Performed: THORACOTOMY MAJOR (Right Chest) EMPYEMA DRAINAGE (Right ) DECORTICATION OF RIGHT LUNG (Right ) PLACEMENT OF ON-Q PAIN PUMP  Patient Location: PACU  Anesthesia Type:General  Level of Consciousness: awake, drowsy and patient cooperative  Airway & Oxygen Therapy: Patient Spontanous Breathing and Patient connected to face mask oxygen  Post-op Assessment: Report given to RN, Post -op Vital signs reviewed and stable and Patient moving all extremities X 4  Post vital signs: Reviewed and stable  Last Vitals:  Vitals Value Taken Time  BP 132/77 04/03/2019 10:36 AM  Temp    Pulse 72 04/03/2019 10:42 AM  Resp 17 04/03/2019 10:42 AM  SpO2 98 % 04/03/2019 10:42 AM  Vitals shown include unvalidated device data.  Last Pain:  Vitals:   04/03/19 1040  TempSrc:   PainSc: (P) Asleep         Complications: No apparent anesthesia complications

## 2019-04-04 ENCOUNTER — Inpatient Hospital Stay (HOSPITAL_COMMUNITY): Payer: Medicare Other

## 2019-04-04 ENCOUNTER — Encounter (HOSPITAL_COMMUNITY): Payer: Self-pay | Admitting: Surgery

## 2019-04-04 ENCOUNTER — Other Ambulatory Visit: Payer: Medicare Other

## 2019-04-04 LAB — GLUCOSE, CAPILLARY
Glucose-Capillary: 259 mg/dL — ABNORMAL HIGH (ref 70–99)
Glucose-Capillary: 156 mg/dL — ABNORMAL HIGH (ref 70–99)
Glucose-Capillary: 209 mg/dL — ABNORMAL HIGH (ref 70–99)
Glucose-Capillary: 146 mg/dL — ABNORMAL HIGH (ref 70–99)
Glucose-Capillary: 144 mg/dL — ABNORMAL HIGH (ref 70–99)
Glucose-Capillary: 139 mg/dL — ABNORMAL HIGH (ref 70–99)

## 2019-04-04 LAB — ACID FAST SMEAR (AFB, MYCOBACTERIA)
Acid Fast Smear: NEGATIVE
Acid Fast Smear: NEGATIVE

## 2019-04-04 LAB — BASIC METABOLIC PANEL
Anion gap: 11 (ref 5–15)
BUN: 14 mg/dL (ref 8–23)
CO2: 23 mmol/L (ref 22–32)
Calcium: 9 mg/dL (ref 8.9–10.3)
Chloride: 102 mmol/L (ref 98–111)
Creatinine, Ser: 0.83 mg/dL (ref 0.61–1.24)
GFR calc Af Amer: >60 mL/min (ref >60)
GFR calc non Af Amer: >60 mL/min (ref >60)
Glucose, Bld: 163 mg/dL — ABNORMAL HIGH (ref 70–99)
Potassium: 4.5 mmol/L (ref 3.5–5.1)
Sodium: 136 mmol/L (ref 135–145)

## 2019-04-04 LAB — CBC
HCT: 38.7 % — ABNORMAL LOW (ref 39.0–52.0)
Hemoglobin: 12.5 g/dL — ABNORMAL LOW (ref 13.0–17.0)
MCH: 28.3 pg (ref 26.0–34.0)
MCHC: 32.3 g/dL (ref 30.0–36.0)
MCV: 87.8 fL (ref 80.0–100.0)
Platelets: 371 10*3/uL (ref 150–400)
RBC: 4.41 MIL/uL (ref 4.22–5.81)
RDW: 12.7 % (ref 11.5–15.5)
WBC: 21.5 10*3/uL — ABNORMAL HIGH (ref 4.0–10.5)
nRBC: 0 % (ref 0.0–0.2)

## 2019-04-04 MED ORDER — ADULT MULTIVITAMIN W/MINERALS CH
1.0000 | ORAL_TABLET | Freq: Every day | ORAL | Status: DC
  Administered 2019-04-04 – 2019-04-06 (×3): 1 via ORAL
  Filled 2019-04-04 (×3): qty 1

## 2019-04-04 MED ORDER — METFORMIN HCL ER 500 MG PO TB24
1000.0000 mg | ORAL_TABLET | Freq: Two times a day (BID) | ORAL | Status: DC
  Administered 2019-04-04 – 2019-04-06 (×5): 1000 mg via ORAL
  Filled 2019-04-04 (×8): qty 2

## 2019-04-04 MED ORDER — LEVOFLOXACIN IN D5W 750 MG/150ML IV SOLN
750.0000 mg | INTRAVENOUS | Status: DC
  Filled 2019-04-04: qty 150

## 2019-04-04 MED ORDER — GLUCERNA SHAKE PO LIQD
237.0000 mL | Freq: Three times a day (TID) | ORAL | Status: DC
  Administered 2019-04-04 – 2019-04-06 (×5): 237 mL via ORAL

## 2019-04-04 NOTE — Progress Notes (Addendum)
[address]       [place] [address]             [phone]      1 Day Post-Op Procedure(s) (LRB): THORACOTOMY MAJOR (Right) EMPYEMA DRAINAGE (Right) DECORTICATION OF RIGHT LUNG (Right) PLACEMENT OF ON-Q PAIN PUMP Subjective: Breathing is pretty comfortable without significant SOB at rest  Objective: Vital signs in last 24 hours: Temp:  [97.7 F (36.5 C)-98.7 F (37.1 C)] 97.9 F (36.6 C) (05/28 0400) Pulse Rate:  [77-109] 107 (05/28 0605) Cardiac Rhythm: Normal sinus rhythm (05/28 0355) Resp:  [8-25] 16 (05/28 0605) BP: (133-165)/(82-107) 155/82 (05/28 0505) SpO2:  [92 %-100 %] 94 % (05/28 0605) Arterial Line BP: (97-181)/(62-90) 140/77 (05/28 0605) Weight:  [115.6 kg] 115.6 kg (05/27 1231)  Hemodynamic parameters for last 24 hours:    Intake/Output from previous day: 05/27 0701 - 05/28 0700 In: 3435 [P.O.:360; I.V.:2925; IV Piggyback:150] Out: 2820 [Urine:2400; Blood:150; Chest Tube:270] Intake/Output this shift: No intake/output data recorded.  General appearance: alert and cooperative Heart: regular rate and rhythm Lungs: min dim in right base, remarkably clear otherwise Abdomen: soft, nonteender or distended Extremities: no edema or calf tenderness Wound: dressing CDI  Lab Results: Recent Labs    04/03/19 0640 04/04/19 0405  WBC 11.8* 21.5*  HGB 13.2 12.5*  HCT 40.4 38.7*  PLT 349 371   BMET:  Recent Labs    04/03/19 0640 04/04/19 0405  NA 138 136  K 4.3 4.5  CL 104 102  CO2 18* 23  GLUCOSE 147* 163*  BUN 14 14  CREATININE 0.78 0.83  CALCIUM 9.5 9.0    PT/INR:  Recent Labs    04/03/19 0640  LABPROT 13.1  INR 1.0   ABG    Component Value Date/Time   PHART 7.370 04/03/2019 0545   HCO3 22.5 04/03/2019 0545   ACIDBASEDEF 2.0 04/03/2019 0545   O2SAT 98.3 04/03/2019 0545   CBG (last 3)  Recent Labs    04/03/19 2023 04/03/19 2348 04/04/19 0347  GLUCAP 259* 165* 156*    Meds Scheduled Meds: .  acetaminophen  1,000 mg Oral Q6H   Or  . acetaminophen (TYLENOL) oral liquid 160 mg/5 mL  1,000 mg Oral Q6H  . amLODipine  5 mg Oral Daily  . atorvastatin  20 mg Oral QPM  . bisacodyl  10 mg Oral Daily  . cetirizine  10 mg Oral QPM  . Chlorhexidine Gluconate Cloth  6 each Topical Daily  . famotidine  20 mg Oral BID  . finasteride  5 mg Oral QPM  . fluticasone  2 spray Each Nare QPM  . imipramine  75 mg Oral QHS  . insulin aspart  0-24 Units Subcutaneous Q4H  . irbesartan  150 mg Oral Daily  . mouth rinse  15 mL Mouth Rinse BID  . metoCLOPramide (REGLAN) injection  10 mg Intravenous Q6H  . mirabegron ER  50 mg Oral QPM  . morphine   Intravenous Q4H  . senna-docusate  1 tablet Oral QHS   Continuous Infusions: . sodium chloride 100 mL/hr at 04/04/19 0046  . bupivacaine 0.5 % ON-Q pump SINGLE CATH 400 mL    . bupivacaine ON-Q pain pump    . levofloxacin (LEVAQUIN) IV 750 mg (04/03/19 1429)  . potassium chloride     PRN Meds:.ALPRAZolam, diphenhydrAMINE **OR** diphenhydrAMINE, hydrALAZINE, ketorolac, levalbuterol, naloxone **AND** sodium chloride flush, ondansetron (ZOFRAN) IV, oxyCODONE, potassium chloride  Xrays Dg Chest [facility] 1 [address]  Result Date: 04/03/2019 CLINICAL DATA:  Post thoracotomy EXAM: PORTABLE CHEST 1 VIEW COMPARISON:  03/25/2019 FINDINGS: Postoperative changes on the right. Right chest tube in place with decreasing right pleural effusion. Small right pleural effusion versus pleural thickening remains. No pneumothorax. Right central line tip is at the cavoatrial junction. No pneumothorax. Vascular congestion. No acute bony abnormality. IMPRESSION: Postoperative changes on the right with decreasing right pleural effusion. Continue small right effusion versus pleural thickening. No pneumothorax. Vascular congestion. Electronically Signed   By: Charlett NoseKevin  Dover M.D.   On: 04/03/2019 12:02   Results for orders placed or performed during the hospital encounter of 04/03/19  Surgical  pcr screen     Status: None   Collection Time: 04/03/19  6:40 AM  Result Value Ref Range Status   MRSA, PCR NEGATIVE NEGATIVE Final   Staphylococcus aureus NEGATIVE NEGATIVE Final    Comment: (NOTE) The Xpert SA Assay (FDA approved for NASAL specimens in patients 68 years of age and older), is one component of a comprehensive surveillance program. It is not intended to diagnose infection nor to guide or monitor treatment. Performed at [facility], [address] [place], [place] [address]   Aerobic/Anaerobic Culture (surgical/deep wound)     Status: None (Preliminary result)   Collection Time: 04/03/19  8:28 AM  Result Value Ref Range Status   Specimen Description PLEURAL RIGHT  Final   Special Requests PATIENT ON FOLLOWING ANCEF  Final   Gram Stain   Final    RARE WBC PRESENT, PREDOMINANTLY PMN NO ORGANISMS SEEN Performed at [facility], [address] [place], [place] [address]    Culture PENDING  Incomplete   Report Status PENDING  Incomplete  Acid Fast Smear (AFB)     Status: None   Collection Time: 04/03/19  8:28 AM  Result Value Ref Range Status   AFB Specimen Processing Direct Inoculation  Final   Acid Fast Smear Negative  Final    Comment: (NOTE) Performed At: [facility] [address] [place], [place] [id number] Jolene SchimkeNagendra Sanjai MD QM:[phone]    Source (AFB) PLEURAL  Final    Comment: RIGHT Performed at [facility], [address] [place], [place] [address]   Aerobic/Anaerobic Culture (surgical/deep wound)     Status: None (Preliminary result)   Collection Time: 04/03/19  8:48 AM  Result Value Ref Range Status   Specimen Description TISSUE RIGHT EMPYEMA  Final   Special Requests NONE  Final   Gram Stain   Final    FEW WBC PRESENT, PREDOMINANTLY PMN NO ORGANISMS SEEN Performed at [facility], [address] [place], [place] [address]    Culture PENDING  Incomplete   Report Status PENDING  Incomplete  Acid Fast Smear  (AFB)     Status: None   Collection Time: 04/03/19  8:48 AM  Result Value Ref Range Status   AFB Specimen Processing Comment  Final    Comment: Tissue Grinding and Digestion/Decontamination   Acid Fast Smear Negative  Final    Comment: (NOTE) Performed At: [facility] [address] [place], [place] [id number] Jolene SchimkeNagendra Sanjai MD QI:[phone]    Source (AFB) TISSUE  Final    Comment: RIGHT EMPYEMA Performed at [facility], [address] [place], [place] [address]    Assessment/Plan: S/P Procedure(s) (LRB): THORACOTOMY MAJOR (Right) EMPYEMA DRAINAGE (Right) DECORTICATION OF RIGHT LUNG (Right) PLACEMENT OF ON-Q PAIN PUMP   1 overall doing well POD#1 2  hemodyn stable, some sinus tachy 3 sats good on 2 liters , push pulm toilet/ rehab as able 4 increased leukocytosis, prob systemic inflammatory response. Cultures pending, cont levaquin 5 250 cc from tube, leave in place for a few days and monitor. CXR looks very stable 6 BS - fair control, cont SSI, transition to home meds- restart metformin today 7 mod pain , cont PCA 8 d/c foley, reduce IVF a little, renal fxn is normal 9 very minor ABL anemia- monitor   LOS: 1 day    [facility]e [facility] 04/04/2019 Pager [phone]   Chart reviewed, patient examined, agree with above. Pain under good control with PCA. CXR looks ok. Chest tube output low, may be small air leak but hard to tell.  Keep chest tube in today, work on IS, ambulation.

## 2019-04-04 NOTE — Progress Notes (Signed)
TCTS BRIEF SICU PROGRESS NOTE  1 Day Post-Op  S/P Procedure(s) (LRB): THORACOTOMY MAJOR (Right) EMPYEMA DRAINAGE (Right) DECORTICATION OF RIGHT LUNG (Right) PLACEMENT OF ON-Q PAIN PUMP   Stable day  Plan: Continue current plan  Purcell Nails, MD 04/04/2019 5:30 PM

## 2019-04-04 NOTE — Progress Notes (Signed)
Pt reported adequate pain control with current RX regimen. Pt rec'd PRN RX for anxiety x 1, HTN x 2, moderately effective. Copious UOP noted, 70 cc output via CT

## 2019-04-04 NOTE — Progress Notes (Signed)
Initial Nutrition Assessment  DOCUMENTATION CODES:   Obesity unspecified  INTERVENTION:    Glucerna Shake po TID, each supplement provides 220 kcal and 10 grams of protein  MVI daily  NUTRITION DIAGNOSIS:   Increased nutrient needs related to post-op healing as evidenced by estimated needs.  GOAL:   Patient will meet greater than or equal to 90% of their needs  MONITOR:   Supplement acceptance, Labs, Skin, PO intake, Weight trends, I & O's  REASON FOR ASSESSMENT:   Malnutrition Screening Tool    ASSESSMENT:   Patient with PMH significant for HTN, HLD, GERD, Hep B, and DM. Presents this admission with right empyema.    5/27- thoracotomy, drainage empyema, decortication of RLL of lung  RD working remotely.  Unable to reach pt by phone x2. Pt currently on a diet without meals completions charted. RD to provide supplementation to maximize kcal and protein this stay.   Weight records are limited in chart over the last year. Will need to obtain nutrition/weight history if possible.   I/O: -60 ml since admit UOP: 2,400 ml x 24 hrs  Chest tube: 270 ml x 24 hrs  Drips: NS @ 75 ml/hr, 10 mEq KCl Medications: dulcolax, SS novolog, metformin, senokot Labs: CBG [phone]  Diet Order:   Diet Order            Diet heart healthy/carb modified Room service appropriate? Yes; Fluid consistency: Thin  Diet effective now              EDUCATION NEEDS:   Not appropriate for education at this time  Skin:  Skin Assessment: Skin Integrity Issues: Skin Integrity Issues:: Incisions Incisions: right chest   Last BM:  5/27  Height:   Ht Readings from Last 1 Encounters:  04/03/19 5' 10.5" (1.791 m)    Weight:   Wt Readings from Last 1 Encounters:  04/03/19 115.6 kg    Ideal Body Weight:  78.2 kg  BMI:  Body mass index is 36.05 kg/m.  Estimated Nutritional Needs:   Kcal:  2100-2300 kcal  Protein:  105-120 grams  Fluid:  >/= 2 L/day    Vanessa Kick RD,  LDN Clinical Nutrition Pager # - [phone]

## 2019-04-05 ENCOUNTER — Inpatient Hospital Stay (HOSPITAL_COMMUNITY): Payer: Medicare Other

## 2019-04-05 LAB — BASIC METABOLIC PANEL
Anion gap: 11 (ref 5–15)
BUN: 18 mg/dL (ref 8–23)
CO2: 23 mmol/L (ref 22–32)
Calcium: 9.1 mg/dL (ref 8.9–10.3)
Chloride: 101 mmol/L (ref 98–111)
Creatinine, Ser: 0.86 mg/dL (ref 0.61–1.24)
GFR calc Af Amer: >60 mL/min (ref >60)
GFR calc non Af Amer: >60 mL/min (ref >60)
Glucose, Bld: 153 mg/dL — ABNORMAL HIGH (ref 70–99)
Potassium: 4.3 mmol/L (ref 3.5–5.1)
Sodium: 135 mmol/L (ref 135–145)

## 2019-04-05 LAB — CBC
HCT: 37.6 % — ABNORMAL LOW (ref 39.0–52.0)
Hemoglobin: 11.9 g/dL — ABNORMAL LOW (ref 13.0–17.0)
MCH: 28.3 pg (ref 26.0–34.0)
MCHC: 31.6 g/dL (ref 30.0–36.0)
MCV: 89.5 fL (ref 80.0–100.0)
Platelets: 331 10*3/uL (ref 150–400)
RBC: 4.2 MIL/uL — ABNORMAL LOW (ref 4.22–5.81)
RDW: 13.1 % (ref 11.5–15.5)
WBC: 18.7 10*3/uL — ABNORMAL HIGH (ref 4.0–10.5)
nRBC: 0 % (ref 0.0–0.2)

## 2019-04-05 LAB — GLUCOSE, CAPILLARY
Glucose-Capillary: 117 mg/dL — ABNORMAL HIGH (ref 70–99)
Glucose-Capillary: 117 mg/dL — ABNORMAL HIGH (ref 70–99)
Glucose-Capillary: 133 mg/dL — ABNORMAL HIGH (ref 70–99)
Glucose-Capillary: 141 mg/dL — ABNORMAL HIGH (ref 70–99)
Glucose-Capillary: 168 mg/dL — ABNORMAL HIGH (ref 70–99)
Glucose-Capillary: 173 mg/dL — ABNORMAL HIGH (ref 70–99)
Glucose-Capillary: 205 mg/dL — ABNORMAL HIGH (ref 70–99)

## 2019-04-05 MED ORDER — INSULIN ASPART 100 UNIT/ML ~~LOC~~ SOLN
0.0000 [IU] | Freq: Three times a day (TID) | SUBCUTANEOUS | Status: DC
  Administered 2019-04-05 (×2): 4 [IU] via SUBCUTANEOUS
  Administered 2019-04-05: 08:00:00 8 [IU] via SUBCUTANEOUS
  Administered 2019-04-06: 2 [IU] via SUBCUTANEOUS

## 2019-04-05 NOTE — Progress Notes (Signed)
2 mL of morphine wasted in stericycle. Witnessed by Ronne Binning, RN.

## 2019-04-05 NOTE — Discharge Summary (Signed)
Physician Discharge Summary       [address]       [place] [id number]             [phone]    Patient ID: Marc SpeakGlenn L Brinson MRN: [id number] DOB/AGE: May 07, 1951 68 y.o.  Admit date: 04/03/2019 Discharge date: 04/06/2019  Admission Diagnoses: Right empyema  Discharge Diagnoses:  1. S/P muscle sparing thoracotomy 2. History of pnemonia 3. History of Diabetes mellitus without complication (HCC) 4. History of GERD (gastroesophageal reflux disease) 5. History of Hypertension 6. History of Hepatitis B 7. History ofAnxiety   Procedure (s):  1. Right muscle-sparing thoracotomy  2. Drainage of empyema  3. Decortication of the right lower lobe of the lung by Dr. Laneta SimmersBartle on 04/03/2019  History of Presenting Illness: The patient is a 68 year old gentleman with history of hypertension, hyperlipidemia,anddiabetes, who was [place] in [place] in late January and[place] in late February. He was in his usual state of health until he woke up one morning in early April not feeling well with some shortness of breath with ambulation or when bending over. He also had severe pain in the right lateral chest which was made worse by deep breathing and coughing. He was coughing up some mucus. He had no fever or chills but did have some fatigue. He was evaluated by Dr. Valentina LucksGriffin with a telemedicine visit and was told to go to the emergency room. He went a CT scan of the chest which ruled out pulmonary embolism. There is patchy atelectatic changes or mild consolidation in the right lower lobe that was felt to possibly be pneumonia. There is a moderate size right pleural effusion. There was a 8 mm x 4 mm nodular opacity in the right upper lobe. Laboratory examination showed leukocytosis of 26.5. The patient was offered admission for intravenous antibiotics versus discharge from the ER on oral antibiotics and follow-up with his primary physician. Patient wanted to go home and was  sent out on doxycycline 100 mg twice daily. He improved clinically but a follow-up chest x-ray on 03/04/2019 showed an interval marked increase in the right pleural effusion which was now large. There is associated dense passive atelectasis or pneumonia involving the right lower lobe. The patient followed up with Dr. Valentina LucksGriffin and was sent for a thoracentesis by conventional radiology which was performed on 03/07/2019.This showed a loculated right pleural fluid collection and only 95 cc of clear yellow fluid was removed. This was reportedly exudative. He returned to see Dr. Valentina LucksGriffin 03/25/2019 and a follow-up chest x-ray showed a stable large right pleural effusion with right lower lobe atelectasis and/or infiltrate. Dr. Valentina LucksGriffin called me and I agreed to see the patient in the office for evaluation.  The patient says that he feels much better than he did in April. He still has mild right chest discomfort at times. He still has some shortness of breath with exertion such as walking up an incline or stairs. He has no problems on level ground. He denies any fever or chills. He said no cough or sputum production. 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  Although this fluid collection has been present for about 6 weeks I think he is probably still in the window where it may be suitable for drainage and decortication of the lung to allow reexpansion of the right lower lobe. The other option would be to leave it alone although I think he would have a chronically trapped right lower  lobe that may cause him persistent shortness of breath. There is some risk with the surgical procedure because it would require a small thoracotomy and decortication of the lung which may result in air leaks from the surface of the lung that could prolong his hospital course. It is also possible that the right lower lobe may be chronically trapped by scar tissue and not reexpand to fill the  space. Dr. Laneta Simmers discussed the options of surgical versus nonsurgical treatment with him and the pros and cons of both approaches. Dr. Laneta Simmers thought surgical treatment is the best option at this time. Dr. Laneta Simmers discussed the operative procedure of right thoracotomy for drainage of the loculated pleural fluid collection and decortication of the lung with him including alternatives, benefits, and risks  Brief Hospital Course:  The patient remained afebrile and hemodynamically stable. A line and foley were removed early in the post operative course. He was put on Levaquin. Final culture showed no bacterial growth. Chest tube output gradually decreased.  Daily chest x rays were obtain ed and remained stable. Chest tube was  05/29.  Patient was surgically stable for transfer from the ICU to [facility] on 05/29. Patient is ambulating on room air. Patient is tolerating a diet and has had a bowel movement. Wounds are clean and dry. Final chest X ray showed small residual pleural effusion, pleural scarring. Patient is felt surgically stable for discharge today.   Latest Vital Signs: Blood pressure (!) 157/97, pulse 94, temperature 97.6 F (36.4 C), temperature source Oral, resp. rate 13, height 5' 10.5" (1.791 m), weight 115.6 kg, SpO2 94 %.  Physical Exam:  General appearance: alert, cooperative and no distress Heart: regular rate and rhythm Lungs: clear to auscultation bilaterally Abdomen: soft, non-tender; bowel sounds normal; no masses,  no organomegaly Extremities: extremities normal, atraumatic, no cyanosis or edema Wound: clean and dry  Discharge Condition: Stable and discharged to home.  Recent laboratory studies:  Lab Results  Component Value Date   WBC 18.7 (H) 04/05/2019   HGB 11.9 (L) 04/05/2019   HCT 37.6 (L) 04/05/2019   MCV 89.5 04/05/2019   PLT 331 04/05/2019   Lab Results  Component Value Date   NA 135 04/05/2019   K 4.3 04/05/2019   CL 101 04/05/2019   CO2 23 04/05/2019    CREATININE 0.86 04/05/2019   GLUCOSE 153 (H) 04/05/2019      Diagnostic Studies: Dg Chest 1 View  Result Date: 03/07/2019 CLINICAL DATA:  Right-sided thoracentesis. EXAM: CHEST  1 VIEW COMPARISON:  Chest x-ray dated March 04, 2019. FINDINGS: The heart size and mediastinal contours are within normal limits. Normal pulmonary vascularity. Moderate loculated right pleural effusion is not significantly changed. Similar-appearing atelectasis in the right middle and lower lobes. The left lung is clear. No pneumothorax. No acute osseous abnormality. IMPRESSION: 1. Similar appearing moderate loculated right pleural effusion. No pneumothorax. Electronically Signed   By: Obie Dredge M.D.   On: 03/07/2019 11:28   Dg Chest 2 View  Result Date: 04/06/2019 CLINICAL DATA:  Thoracotomy EXAM: CHEST - 2 VIEW COMPARISON:  04/05/2019 FINDINGS: Right chest tube removed. Empyema at the right lung base has increased. Right jugular central venous catheter is stable with its tip in the mid SVC. Normal heart size. Heterogeneous opacities at the right lung base are increased. Minimal subsegmental atelectasis at the left base. IMPRESSION: Right chest tube removed. Loculated right pleural effusion and basilar heterogeneous opacities increased. Electronically Signed   By: Dahlia Client.D.  On: 04/06/2019 08:51   Dg Chest 2 View  Result Date: 03/25/2019 CLINICAL DATA:  Recurrent right pleural effusion, shortness of breath EXAM: CHEST - 2 VIEW COMPARISON:  03/07/2019 FINDINGS: Large right pleural effusion with right lower lobe atelectasis or infiltrate, stable. Heart is normal size. Left lung clear. No effusions or acute bony abnormality. IMPRESSION: Stable large right pleural effusion with right lower lobe atelectasis or infiltrate. Electronically Signed   By: Charlett Nose M.D.   On: 03/25/2019 12:24   Ct Chest Wo Contrast  Result Date: 03/28/2019 CLINICAL DATA:  Loculated right-sided pleural effusion EXAM: CT CHEST  WITHOUT CONTRAST TECHNIQUE: Multidetector CT imaging of the chest was performed following the standard protocol without IV contrast. COMPARISON:  CT chest dated 02/11/2019 FINDINGS: Cardiovascular: Aortic calcifications are noted. Heart size is within normal limits. There are coronary and aortic valve calcifications. No pericardial effusion. Mediastinum/Nodes: No enlarged mediastinal or axillary lymph nodes. Thyroid gland, trachea, and esophagus demonstrate no significant findings. Lungs/Pleura: There is a 5 mm pulmonary nodule in the right upper lobe (axial series 8, image 46). There are multiple small pulmonary nodules at the left lung base measuring up to approximately 6 mm. There are 5 mm pulmonary nodules in the right lower lobe. There is a moderate-sized chronic appearing right-sided pleural effusion. There is thickening of the surrounding pleura. There is adjacent airspace atelectasis versus consolidation. Upper Abdomen: No acute abnormality. Musculoskeletal: No chest wall mass or suspicious bone lesions identified. IMPRESSION: 1. Chronic appearing right-sided pleural effusion with associated pleural thickening. Correlation with recent thoracentesis results is recommended as an underlying empyema cannot be excluded on this exam. There is adjacent atelectasis versus consolidation. 2. Multiple pulmonary nodules bilaterally measuring up to approximately 6 mm, better visualized on today's exam. Non-contrast chest CT at 3-6 months is recommended. If the nodules are stable at time of repeat CT, then future CT at 18-24 months (from today's scan) is considered optional for low-risk patients, but is recommended for high-risk patients. This recommendation follows the consensus statement: Guidelines for Management of Incidental Pulmonary Nodules Detected on CT Images: From the Fleischner Society 2017; Radiology 2017; 284:228-243. 3. Aortic Atherosclerosis (ICD10-I70.0). Electronically Signed   By: Katherine Mantle  M.D.   On: 03/28/2019 16:20   Dg Chest Port 1 View  Result Date: 04/05/2019 CLINICAL DATA:  Chest tube in place.  Status post thoracotomy. EXAM: PORTABLE CHEST 1 VIEW COMPARISON:  Radiograph of Apr 04, 2019. FINDINGS: Stable cardiomediastinal silhouette. Left lung is clear. Right-sided chest tube is unchanged in position. No definite pneumothorax is noted. Stable small loculated pleural effusion or pleural thickening is noted with associated right basilar atelectasis. Bony thorax is unremarkable. IMPRESSION: Stable position of right-sided chest tube. No definite pneumothorax is noted. Stable right basilar atelectasis is noted with associated pleural thickening or small loculated effusion. Electronically Signed   By: Lupita Raider M.D.   On: 04/05/2019 07:41   Dg Chest Port 1 View  Result Date: 04/04/2019 CLINICAL DATA:  Chest tube.  Prior thoracotomy.  Sore chest. EXAM: PORTABLE CHEST 1 VIEW COMPARISON:  04/03/2019. FINDINGS: Right IJ line and right chest tube in stable position. Right chest tube is coiled over the right lower chest. Stable cardiomegaly. Low lung volumes with persistent bibasilar atelectasis. Small right pleural effusion again noted without interim change. No pneumothorax. IMPRESSION: 1. Right IJ line right chest tube in stable position. No pneumothorax. 2. Low lung volumes with bibasilar atelectasis again noted. Small right pleural effusion again noted. Chest appears  unchanged from prior exam. Electronically Signed   By: ThomaMaisie FusRegister   On: 04/04/2019 07:48   Dg Chest Port 1 View  Result Date: 04/03/2019 CLINICAL DATA:  Post thoracotomy EXAM: PORTABLE CHEST 1 VIEW COMPARISON:  03/25/2019 FINDINGS: Postoperative changes on the right. Right chest tube in place with decreasing right pleural effusion. Small right pleural effusion versus pleural thickening remains. No pneumothorax. Right central line tip is at the cavoatrial junction. No pneumothorax. Vascular congestion. No acute bony  abnormality. IMPRESSION: Postoperative changes on the right with decreasing right pleural effusion. Continue small right effusion versus pleural thickening. No pneumothorax. Vascular congestion. Electronically Signed   By: Charlett Nose M.D.   On: 04/03/2019 12:02   Ir Thoracentesis Asp Pleural Space W/img Guide  Result Date: 03/07/2019 INDICATION: Recent pneumonia. Right-sided pleural effusion. Request diagnostic and therapeutic thoracentesis. EXAM: ULTRASOUND GUIDED RIGHT THORACENTESIS MEDICATIONS: None. COMPLICATIONS: None immediate. Postprocedural chest x-ray negative for pneumothorax. PROCEDURE: An ultrasound guided thoracentesis was thoroughly discussed with the patient and questions answered. The benefits, risks, alternatives and complications were also discussed. The patient understands and wishes to proceed with the procedure. Written consent was obtained. Ultrasound of the right chest demonstrates severely loculated effusion with numerous septations. Largest pocket was chosen for thoracentesis. Ultrasound was performed to localize and mark an adequate pocket of fluid in the right chest. The area was then prepped and draped in the normal sterile fashion. 1% Lidocaine was used for local anesthesia. Under ultrasound guidance a 6 Fr Safe-T-Centesis catheter was introduced. Thoracentesis was performed yielding only 95 mL. The catheter was removed and a dressing applied. FINDINGS: A total of approximately only 95 mL of clear yellow fluid was removed. Samples were sent to the laboratory as requested by the clinical team. IMPRESSION: Successful ultrasound guided right thoracentesis yielding 95 mL of pleural fluid. Read by: Brayton El PA-C Electronically Signed   By: Richarda Overlie M.D.   On: 03/07/2019 11:26         Discharge Medications: Allergies as of 04/06/2019      Reactions   Penicillins Other (See Comments)   Childhood Allergy Did it involve swelling of the face/tongue/throat, SOB, or low BP?  UNK Did it involve sudden or severe rash/hives, skin peeling, or any reaction on the inside of your mouth or nose? UNK Did you need to seek medical attention at a hospital or doctor's office? UNK When did it last happen?childhood  If all above answers are NO, may proceed with cephalosporin use.      Medication List    STOP taking these medications   naproxen sodium 220 MG tablet Commonly known as:  ALEVE     TAKE these medications   acetaminophen 500 MG tablet Commonly known as:  TYLENOL Take 2 tablets (1,000 mg total) by mouth every 6 (six) hours.   AeroChamber Plus Flo-Vu Large Misc See admin instructions.   ALPRAZolam 0.25 MG tablet Commonly known as:  XANAX Take 0.25 mg by mouth every 6 (six) hours as needed for anxiety.   amLODipine-valsartan 5-160 MG tablet Commonly known as:  EXFORGE Take 1 tablet by mouth daily.   aspirin EC 81 MG tablet Take 81 mg by mouth daily.   atorvastatin 20 MG tablet Commonly known as:  LIPITOR Take 20 mg by mouth every evening.   BIOFREEZE EX Apply 1 application topically 4 (four) times daily as needed (pain.).   chlorpheniramine 4 MG tablet Commonly known as:  CHLOR-TRIMETON Take 4 mg by mouth 3 (three) times  daily as needed for allergies.   EPINEPHrine 0.3 mg/0.3 mL Soaj injection Commonly known as:  EPI-PEN Inject 0.3 mg into the muscle as needed for anaphylaxis.   famotidine 20 MG tablet Commonly known as:  PEPCID Take 20 mg by mouth 2 (two) times daily.   finasteride 5 MG tablet Commonly known as:  PROSCAR Take 5 mg by mouth every evening.   fluticasone 50 MCG/ACT nasal spray Commonly known as:  FLONASE Place 2 sprays into both nostrils every evening.   glipiZIDE 5 MG tablet Commonly known as:  GLUCOTROL Take 5 mg by mouth 2 (two) times a day.   ibuprofen 200 MG tablet Commonly known as:  ADVIL Take 400-600 mg by mouth daily as needed for fever or moderate pain.   imipramine 75 MG capsule Commonly  known as:  TOFRANIL-PM Take 75 mg by mouth at bedtime.   levalbuterol 45 MCG/ACT inhaler Commonly known as:  XOPENEX HFA Inhale 1-2 puffs into the lungs every 4 (four) hours as needed for wheezing or shortness of breath.   levocetirizine 5 MG tablet Commonly known as:  XYZAL Take 5 mg by mouth every evening.   metFORMIN 500 MG 24 hr tablet Commonly known as:  GLUCOPHAGE-XR Take 1,000 mg by mouth 2 (two) times daily.   Myrbetriq 50 MG Tb24 tablet Generic drug:  mirabegron ER Take 50 mg by mouth every evening.   traMADol 50 MG tablet Commonly known as:  Ultram Take 1-2 tablets (50-100 mg total) by mouth every 4 (four) hours as needed.   Trulicity 1.5 MG/0.5ML Sopn Generic drug:  Dulaglutide Inject 1.5 mg into the skin every Friday.   zolpidem 10 MG tablet Commonly known as:  AMBIEN Take 10 mg by mouth at bedtime.       Follow Up Appointments: Follow-up Information    Alleen Borne, MD Follow up.   Specialty:  Cardiothoracic Surgery Why:  Office will mail you an appointment date and time... If you do not receive this please contact the office for your appointment. Contact information: [address] [place] [address] [phone]           Signed: Carl Best 04/06/2019, 9:23 AM

## 2019-04-05 NOTE — Progress Notes (Signed)
2 Days Post-Op Procedure(s) (LRB): THORACOTOMY MAJOR (Right) EMPYEMA DRAINAGE (Right) DECORTICATION OF RIGHT LUNG (Right) PLACEMENT OF ON-Q PAIN PUMP Subjective: Pain under control with PCA but making him sleepy.  Objective: Vital signs in last 24 hours: Temp:  [97.8 F (36.6 C)-98.2 F (36.8 C)] 97.9 F (36.6 C) (05/29 0000) Pulse Rate:  [67-120] 96 (05/29 0600) Cardiac Rhythm: Normal sinus rhythm (05/29 0400) Resp:  [6-25] 8 (05/29 0600) BP: (97-143)/(60-92) 123/85 (05/29 0600) SpO2:  [90 %-99 %] 93 % (05/29 0600) Arterial Line BP: (133-147)/(74-80) 136/75 (05/28 1000)  Hemodynamic parameters for last 24 hours:    Intake/Output from previous day: 05/28 0701 - 05/29 0700 In: 602.4 [I.V.:452.3; IV Piggyback:150] Out: 1225 [Urine:1025; Chest Tube:200] Intake/Output this shift: No intake/output data recorded.  General appearance: alert and cooperative Heart: regular rate and rhythm, S1, S2 normal, no murmur, click, rub or gallop Lungs: clear to auscultation bilaterally chest tube output low, tidaling but no air leak.  Lab Results: Recent Labs    04/04/19 0405 04/05/19 0426  WBC 21.5* 18.7*  HGB 12.5* 11.9*  HCT 38.7* 37.6*  PLT 371 331   BMET:  Recent Labs    04/04/19 0405 04/05/19 0426  NA 136 135  K 4.5 4.3  CL 102 101  CO2 23 23  GLUCOSE 163* 153*  BUN 14 18  CREATININE 0.83 0.86  CALCIUM 9.0 9.1    PT/INR:  Recent Labs    04/03/19 0640  LABPROT 13.1  INR 1.0   ABG    Component Value Date/Time   PHART 7.370 04/03/2019 0545   HCO3 22.5 04/03/2019 0545   ACIDBASEDEF 2.0 04/03/2019 0545   O2SAT 98.3 04/03/2019 0545   CBG (last 3)  Recent Labs    04/04/19 2012 04/05/19 0010 04/05/19 0416  GLUCAP 139* 117* 141*   CXR: ok  Assessment/Plan: S/P Procedure(s) (LRB): THORACOTOMY MAJOR (Right) EMPYEMA DRAINAGE (Right) DECORTICATION OF RIGHT LUNG (Right) PLACEMENT OF ON-Q PAIN PUMP  POD 2  OR cultures negative. Drainage low so will  remove chest tube. Continue IS DC PCA and continue oral pain med and Toradol.  Check PA/Lat CXR in am. Transfer to 4E. He can go home when comfortable on oral pain meds and ambulating well.   LOS: 2 days    Alleen Borne 04/05/2019

## 2019-04-06 ENCOUNTER — Inpatient Hospital Stay (HOSPITAL_COMMUNITY): Payer: Medicare Other

## 2019-04-06 LAB — GLUCOSE, CAPILLARY
Glucose-Capillary: 159 mg/dL — ABNORMAL HIGH (ref 70–99)
Glucose-Capillary: 120 mg/dL — ABNORMAL HIGH (ref 70–99)

## 2019-04-06 MED ORDER — TRAMADOL HCL 50 MG PO TABS: 50.0000 mg | ORAL_TABLET | ORAL | 0 refills | Status: DC | PRN

## 2019-04-06 MED ORDER — ACETAMINOPHEN 500 MG PO TABS: 1000.0000 mg | ORAL_TABLET | Freq: Four times a day (QID) | ORAL | 0 refills | Status: DC

## 2019-04-06 NOTE — Progress Notes (Addendum)
      [address]       [place] [address]             [phone]      3 Days Post-Op Procedure(s) (LRB): THORACOTOMY MAJOR (Right) EMPYEMA DRAINAGE (Right) DECORTICATION OF RIGHT LUNG (Right) PLACEMENT OF ON-Q PAIN PUMP   Subjective:  No new complaints.  He is doing much better.  His pain is now just a soreness.  He has ambulated in the unit without difficulty.  Objective: Vital signs in last 24 hours: Temp:  [97.6 F (36.4 C)-98.1 F (36.7 C)] 97.6 F (36.4 C) (05/30 0502) Pulse Rate:  [94-125] 94 (05/30 0502) Cardiac Rhythm: Normal sinus rhythm (05/30 0709) Resp:  [9-26] 13 (05/30 0502) BP: (127-157)/(74-97) 157/97 (05/30 0502) SpO2:  [94 %] 94 % (05/30 0502)  Intake/Output from previous day: 05/29 0701 - 05/30 0700 In: 270.4 [P.O.:240; I.V.:30.4] Out: -   General appearance: alert, cooperative and no distress Heart: regular rate and rhythm Lungs: clear to auscultation bilaterally Abdomen: soft, non-tender; bowel sounds normal; no masses,  no organomegaly Extremities: extremities normal, atraumatic, no cyanosis or edema Wound: clean and dry  Lab Results: Recent Labs    04/04/19 0405 04/05/19 0426  WBC 21.5* 18.7*  HGB 12.5* 11.9*  HCT 38.7* 37.6*  PLT 371 331   BMET:  Recent Labs    04/04/19 0405 04/05/19 0426  NA 136 135  K 4.5 4.3  CL 102 101  CO2 23 23  GLUCOSE 163* 153*  BUN 14 18  CREATININE 0.83 0.86  CALCIUM 9.0 9.1    PT/INR: No results for input(s): LABPROT, INR in the last 72 hours. ABG    Component Value Date/Time   PHART 7.370 04/03/2019 0545   HCO3 22.5 04/03/2019 0545   ACIDBASEDEF 2.0 04/03/2019 0545   O2SAT 98.3 04/03/2019 0545   CBG (last 3)  Recent Labs    04/05/19 1815 04/05/19 2131 04/06/19 0616  GLUCAP 168* 117* 159*    Assessment/Plan: S/P Procedure(s) (LRB): THORACOTOMY MAJOR (Right) EMPYEMA DRAINAGE (Right) DECORTICATION OF RIGHT LUNG (Right) PLACEMENT OF ON-Q PAIN PUMP  1. CV-  hemodynamically stable 2. Pulm- CXR with small amount of pleural fluid left, pleural scarring, no pneumothorax 3. ID- remains afebrile, OR cultures negative 4. Dispo- patient stable, pain is controlled, ambulating without difficulty,    LOS: 3 days    Denny Peon Barrett PA-C 04/06/2019   Chart reviewed, patient examined, agree with above. He feels well and is ambulating well. CXR looks ok with right basilar-lateral pleural thickening and some residual effusion. It looks the way I would expect and this will resolve with time. He is going home today and I will see back in a few weeks with CXR.

## 2019-04-06 NOTE — Discharge Instructions (Signed)
Discharge Instructions:  1. You may shower, please wash incisions daily with soap and water and keep dry.  If you wish to cover wounds with dressing you may do so but please keep clean and change daily.  No tub baths or swimming until incisions have completely healed.  If your incisions become red or develop any drainage please call our office at [phone]  2. No Driving until cleared by Dr. Bartle's office and you are no longer using narcotic pain medications  3. Fever of 101.5 for at least 24 hours with no source, please contact our office at [phone]  4. Activity- up as tolerated, please walk at least 3 times per day.  Avoid strenuous activity, no lifting, pushing, or pulling with your arms over 8-10 lbs for a minimum of 6 weeks  5. If any questions or concerns arise, please do not hesitate to contact our office at [phone] 

## 2019-04-08 LAB — AEROBIC/ANAEROBIC CULTURE W GRAM STAIN (SURGICAL/DEEP WOUND)
Culture: NO GROWTH
Culture: NO GROWTH

## 2019-04-11 ENCOUNTER — Other Ambulatory Visit: Payer: Self-pay

## 2019-04-12 ENCOUNTER — Ambulatory Visit (INDEPENDENT_AMBULATORY_CARE_PROVIDER_SITE_OTHER): Payer: Self-pay

## 2019-04-12 DIAGNOSIS — Z4802 Encounter for removal of sutures: Secondary | ICD-10-CM

## 2019-04-12 NOTE — Progress Notes (Signed)
Removed 1 suture from chest tube incision site, no signs of infection and patient tolerated well.

## 2019-04-12 NOTE — Addendum Note (Signed)
Addended by: Rowland Lathe R on: 04/12/2019 10:35 AM   Modules accepted: Orders

## 2019-04-23 ENCOUNTER — Other Ambulatory Visit: Payer: Self-pay

## 2019-04-23 ENCOUNTER — Other Ambulatory Visit: Payer: Self-pay | Admitting: Surgery

## 2019-04-23 DIAGNOSIS — J90 Pleural effusion, not elsewhere classified: Secondary | ICD-10-CM

## 2019-04-24 ENCOUNTER — Ambulatory Visit (INDEPENDENT_AMBULATORY_CARE_PROVIDER_SITE_OTHER): Payer: Self-pay | Admitting: Surgery

## 2019-04-24 ENCOUNTER — Ambulatory Visit
Admission: RE | Admit: 2019-04-24 | Discharge: 2019-04-24 | Disposition: A | Discharge status: Home / Self Care | Payer: Medicare Other | Source: Ambulatory Visit | Attending: Surgery | Admitting: Surgery

## 2019-04-24 ENCOUNTER — Encounter: Payer: Self-pay | Admitting: Surgery

## 2019-04-24 VITALS — BP 132/96 | HR 103 | Temp 97.3°F | Resp 18 | Ht 70.5 in | Wt 254.0 lb

## 2019-04-24 DIAGNOSIS — Z09 Encounter for follow-up examination after completed treatment for conditions other than malignant neoplasm: Secondary | ICD-10-CM

## 2019-04-24 DIAGNOSIS — Z9889 Other specified postprocedural states: Secondary | ICD-10-CM

## 2019-04-24 DIAGNOSIS — J869 Pyothorax without fistula: Secondary | ICD-10-CM

## 2019-04-24 DIAGNOSIS — J90 Pleural effusion, not elsewhere classified: Secondary | ICD-10-CM

## 2019-04-24 NOTE — Progress Notes (Signed)
HPI: Patient returns for routine postoperative follow-up having undergone right muscle-sparing thoracotomy with drainage of empyema and decortication of the right lower lobe of lung on 04/03/2019. The patient's early postoperative recovery while in the hospital was notable for an uncomplicated postoperative course.  His aerobic and anaerobic cultures were negative.  Fungal and AFB cultures are still pending. Since hospital discharge the patient reports that he has been feeling well overall.  He has been walking for 1 to 1-1/2 hours daily.  He said that he still develops some right-sided chest wall pain while using his incentive spirometer but otherwise feels well.   Current Outpatient Medications  Medication Sig Dispense Refill  . acetaminophen (TYLENOL) 500 MG tablet Take 2 tablets (1,000 mg total) by mouth every 6 (six) hours. 30 tablet 0  . ALPRAZolam (XANAX) 0.25 MG tablet Take 0.25 mg by mouth every 6 (six) hours as needed for anxiety.    [name] amLODipine-valsartan (EXFORGE) 5-160 MG tablet Take 1 tablet by mouth daily.    [name] aspirin EC 81 MG tablet Take 81 mg by mouth daily.    [name] atorvastatin (LIPITOR) 20 MG tablet Take 20 mg by mouth every evening.    . chlorpheniramine (CHLOR-TRIMETON) 4 MG tablet Take 4 mg by mouth 3 (three) times daily as needed for allergies.    . Dulaglutide (TRULICITY) 1.5 PN/3.6RW SOPN Inject 1.5 mg into the skin every Friday.    [name] EPINEPHrine 0.3 mg/0.3 mL IJ SOAJ injection Inject 0.3 mg into the muscle as needed for anaphylaxis.     . famotidine (PEPCID) 20 MG tablet Take 20 mg by mouth 2 (two) times daily.    . finasteride (PROSCAR) 5 MG tablet Take 5 mg by mouth every evening.    . fluticasone (FLONASE) 50 MCG/ACT nasal spray Place 2 sprays into both nostrils every evening.     [name] glipiZIDE (GLUCOTROL) 5 MG tablet Take 5 mg by mouth 2 (two) times a day.     . ibuprofen (ADVIL,MOTRIN) 200 MG tablet Take 400-600 mg by mouth daily as needed for fever or moderate  pain.    [name] levocetirizine (XYZAL) 5 MG tablet Take 5 mg by mouth every evening.    . Menthol, Topical Analgesic, (BIOFREEZE EX) Apply 1 application topically 4 (four) times daily as needed (pain.).     [name] metFORMIN (GLUCOPHAGE-XR) 500 MG 24 hr tablet Take 1,000 mg by mouth 2 (two) times daily.    [name] MYRBETRIQ 50 MG TB24 tablet Take 50 mg by mouth every evening.    . zolpidem (AMBIEN) 10 MG tablet Take 10 mg by mouth at bedtime.     No current facility-administered medications for this visit.     Physical Exam: BP (!) 132/96 (BP Location: Right Arm, Patient Position: Sitting, Cuff Size: Normal)   Pulse (!) 103   Temp (!) 97.3 F (36.3 C) (Other (Comment)) Comment (Src): thermal  Resp 18   Ht 5' 10.5" (1.791 m)   Wt 254 lb (115.2 kg)   SpO2 95% Comment: RA  BMI 35.93 kg/m  He looks well. Cardiac exam shows regular rate and rhythm with normal heart sounds. Lung exam is clear. The right chest incision is healing well.  Diagnostic Tests:  CLINICAL DATA:  Empyema drainage.  EXAM: CHEST - 2 VIEW  COMPARISON:  Apr 06, 2019  FINDINGS: The loculated right-sided pleural fluid with associated underlying opacity is stable. The left lung is clear. The cardiomediastinal silhouette is clear. No other abnormalities.  IMPRESSION: The loculated right-sided pleural fluid is stable. No other changes.   Electronically Signed   By: Gerome Samavid  Williams III M.D   On: 04/24/2019 09:12   Impression:  Overall I think Mr. Anastasia FiedlerHelms is making good recovery following his surgery.  Chest x-ray still shows some haziness at the right base which is most likely a combination of pleural thickening and residual scar tissue over the surface of the right lower lobe which could not be completely removed.  I think his appearance on chest x-ray will likely improve some over time but he will always have some density at the right base.  He is symptomatically much better than he was preoperatively and I would  expect him to continue to improve if he remains active.  I asked him not to lift anything heavier than 10 pounds for 8 weeks postoperatively.  Plan:  I will plan to see him back at 3 months postoperatively with a follow-up chest x-ray.   Alleen BorneBryan K Bartle, MD [facility] [phone]

## 2019-05-03 LAB — FUNGUS CULTURE WITH STAIN

## 2019-05-03 LAB — FUNGUS CULTURE RESULT

## 2019-05-03 LAB — FUNGAL ORGANISM REFLEX

## 2019-05-16 LAB — ACID FAST CULTURE WITH REFLEXED SENSITIVITIES (MYCOBACTERIA)
Acid Fast Culture: NEGATIVE
Acid Fast Culture: NEGATIVE

## 2019-05-31 ENCOUNTER — Ambulatory Visit: Payer: Medicare Other | Admitting: Physician Assistant

## 2019-06-25 ENCOUNTER — Other Ambulatory Visit: Payer: Self-pay | Admitting: Surgery

## 2019-06-25 DIAGNOSIS — J90 Pleural effusion, not elsewhere classified: Secondary | ICD-10-CM

## 2019-06-26 ENCOUNTER — Ambulatory Visit
Admission: RE | Admit: 2019-06-26 | Discharge: 2019-06-26 | Disposition: A | Discharge status: Home / Self Care | Payer: Medicare Other | Source: Ambulatory Visit | Attending: Surgery | Admitting: Surgery

## 2019-06-26 ENCOUNTER — Ambulatory Visit (INDEPENDENT_AMBULATORY_CARE_PROVIDER_SITE_OTHER): Payer: Self-pay | Admitting: Surgery

## 2019-06-26 ENCOUNTER — Encounter: Payer: Self-pay | Admitting: Surgery

## 2019-06-26 ENCOUNTER — Other Ambulatory Visit: Payer: Self-pay

## 2019-06-26 VITALS — BP 128/83 | HR 82 | Temp 97.7°F | Resp 16 | Ht 70.5 in | Wt 252.0 lb

## 2019-06-26 DIAGNOSIS — J90 Pleural effusion, not elsewhere classified: Secondary | ICD-10-CM

## 2019-06-26 DIAGNOSIS — Z09 Encounter for follow-up examination after completed treatment for conditions other than malignant neoplasm: Secondary | ICD-10-CM

## 2019-06-26 DIAGNOSIS — J869 Pyothorax without fistula: Secondary | ICD-10-CM

## 2019-06-26 NOTE — Progress Notes (Signed)
HPI:  Patient returns for routine postoperative follow-up having undergone right muscle-sparing thoracotomy with drainage of empyema and decortication of the right lower lobe of lung on 04/03/2019. The patient's early postoperative recovery while in the hospital was notable for an uncomplicated postoperative course.  His aerobic and anaerobic cultures were negative.  Fungal and AFB cultures were negative.  Since I last saw him on 04/24/2019 he said that he is continued to feel well.  He denies any shortness of breath.  He is able to do 2500 on his incentive spirometer.    Current Outpatient Medications  Medication Sig Dispense Refill  . acetaminophen (TYLENOL) 500 MG tablet Take 2 tablets (1,000 mg total) by mouth every 6 (six) hours. 30 tablet 0  . ALPRAZolam (XANAX) 0.25 MG tablet Take 0.25 mg by mouth every 6 (six) hours as needed for anxiety.    [name] amLODipine-valsartan (EXFORGE) 5-160 MG tablet Take 1 tablet by mouth daily.    [name] aspirin EC 81 MG tablet Take 81 mg by mouth daily.    [name] atorvastatin (LIPITOR) 20 MG tablet Take 20 mg by mouth every evening.    . chlorpheniramine (CHLOR-TRIMETON) 4 MG tablet Take 4 mg by mouth 3 (three) times daily as needed for allergies.    . Dulaglutide (TRULICITY) 1.5 EP/3.2RJ SOPN Inject 1.5 mg into the skin every Friday.    [name] EPINEPHrine 0.3 mg/0.3 mL IJ SOAJ injection Inject 0.3 mg into the muscle as needed for anaphylaxis.     . famotidine (PEPCID) 20 MG tablet Take 20 mg by mouth 2 (two) times daily.    . finasteride (PROSCAR) 5 MG tablet Take 5 mg by mouth every evening.    . fluticasone (FLONASE) 50 MCG/ACT nasal spray Place 2 sprays into both nostrils every evening.     [name] glipiZIDE (GLUCOTROL) 5 MG tablet Take 5 mg by mouth 2 (two) times a day.     . ibuprofen (ADVIL,MOTRIN) 200 MG tablet Take 400-600 mg by mouth daily as needed for fever or moderate pain.    [name] levocetirizine (XYZAL) 5 MG tablet Take 5 mg by mouth every evening.    . Menthol,  Topical Analgesic, (BIOFREEZE EX) Apply 1 application topically 4 (four) times daily as needed (pain.).     [name] metFORMIN (GLUCOPHAGE-XR) 500 MG 24 hr tablet Take 1,000 mg by mouth 2 (two) times daily.    [name] MYRBETRIQ 50 MG TB24 tablet Take 50 mg by mouth every evening.    . zolpidem (AMBIEN) 10 MG tablet Take 10 mg by mouth at bedtime.     No current facility-administered medications for this visit.      Physical Exam: BP 128/83 (BP Location: Right Arm, Patient Position: Sitting, Cuff Size: Large)   Pulse 82   Temp 97.7 F (36.5 C)   Resp 16   Ht 5' 10.5" (1.791 m)   Wt 252 lb (114.3 kg)   SpO2 95% Comment: RA  BMI 35.65 kg/m  He looks well. Lung exam reveals decreased breath sounds at the right base. His incision is well-healed.  Diagnostic Tests:  CLINICAL DATA:  68 year old male with loculated right-sided pleural effusion. Follow-up study.  EXAM: CHEST - 2 VIEW  COMPARISON:  Chest radiograph dated 04/24/2019  FINDINGS: No significant interval change in the size of the right pleural effusion and associated compressive atelectasis of the right lung base since the prior radiograph. The left lung remains clear. There is no pneumothorax. Stable cardiac silhouette. No acute osseous  pathology.  IMPRESSION: No significant interval change in the size of the right pleural effusion and associated compressive atelectasis of the right lung base.   Electronically Signed   By: Elgie CollardArash  Radparvar M.D.   On: 06/26/2019 10:10   Impression:  He has made a good recovery following his surgery.  Chest x-ray continues to show a small loculated right pleural effusion and compressive atelectasis at the right base which is stable and I would expect this to gradually resolve over time.  I told him that he can return to normal activity.  Plan:  He does not need to return to see me for routine follow-up but I will be happy to see him back if the need arises.   Alleen BorneBryan K , MD  [facility] [phone]

## 2019-07-17 ENCOUNTER — Other Ambulatory Visit: Payer: Self-pay

## 2019-07-17 ENCOUNTER — Encounter: Payer: Self-pay | Admitting: Physician Assistant

## 2019-07-17 ENCOUNTER — Ambulatory Visit (INDEPENDENT_AMBULATORY_CARE_PROVIDER_SITE_OTHER): Payer: Medicare Other | Admitting: Physician Assistant

## 2019-07-17 DIAGNOSIS — F411 Generalized anxiety disorder: Secondary | ICD-10-CM

## 2019-07-17 DIAGNOSIS — G4700 Insomnia, unspecified: Secondary | ICD-10-CM | POA: Diagnosis not present

## 2019-07-17 MED ORDER — BUSPIRONE HCL 10 MG PO TABS: ORAL_TABLET | ORAL | 0 refills | Status: DC

## 2019-07-17 NOTE — Progress Notes (Signed)
[facility]  Patient ID: Marc Gates,  MRN: [id number]  PCP: Kirby Funk, MD  Date of Evaluation: 07/17/2019 Time spent:15 minutes  Chief Complaint:  Chief Complaint    Anxiety; Follow-up     Virtual Visit via Telephone Note  I connected with patient by a video enabled telemedicine application or telephone, with their informed consent, and verified patient privacy and that I am speaking with the correct person using two identifiers.  I am private, in my home and the patient is home.   I discussed the limitations, risks, security and privacy concerns of performing an evaluation and management service by telephone and the availability of in person appointments. I also discussed with the patient that there may be a patient responsible charge related to this service. The patient expressed understanding and agreed to proceed.   I discussed the assessment and treatment plan with the patient. The patient was provided an opportunity to ask questions and all were answered. The patient agreed with the plan and demonstrated an understanding of the instructions.   The patient was advised to call back or seek an in-person evaluation if the symptoms worsen or if the condition fails to improve as anticipated.  I provided 15 minutes of non-face-to-face time during this encounter.  HISTORY/CURRENT STATUS: HPI Increased anxiety.  He restarted the Buspar about 2 months ago.  He had been traveling internationally and was having his usual, occasional panic attacks for which he takes Xanax.  Then he ended up having pneumonia and because of the situation with the coronavirus pandemic, he was unable to see his PCP.  He was having such a hard time breathing and coughing, he ended up going to the emergency room.  He was treated there and given the option of being hospitalized or going back home.  He chose the latter.  He then ended up with pleurisy, which was extremely painful.  Then, had to have  a thoracotomy to remove scar tissue.  "So I have been through the ringer the past few months.  Overall through all of it, the anxiety was not too bad, but after it was all over, I started having panic attacks more often.  I started the BuSpar back a few months ago and the panic attacks are not as often.  Sometimes I will take an as needed BuSpar during the middle of the day hoping that I will not have to take the Xanax.  Sometimes it does seem to help."  He still has trouble sleeping but the Ambien helps.  He usually takes 5 to 7.5 mg.  If he takes more than that he has a hangover.  If he takes less than that, he has to get up and go to the bathroom so many times that he cannot sleep through that urge.  Patient denies loss of interest in usual activities and is able to enjoy things.  Denies decreased energy or motivation.  Appetite has not changed.  No extreme sadness, tearfulness, or feelings of hopelessness.  Denies any changes in concentration, making decisions or remembering things.  Denies suicidal or homicidal thoughts.  Denies dizziness, syncope, seizures, numbness, tingling, tremor, tics, unsteady gait, slurred speech, confusion. Denies muscle or joint pain, stiffness, or dystonia.  Individual Medical History/ Review of Systems: Changes? :Yes  Had pneumonia, NOT COVID, and 'doctors wouldn't see me b/c it was the early stages of Covid.'  He ended up needing a thoracotomy.  See HPI.  Past medications for mental health diagnoses  include: Zoloft ? Unsure of the antidpressant.  He only took 1 pill because he slept the whole next day.  Allergies: Penicillins  Current Medications:  Current Outpatient Medications:  .  acetaminophen (TYLENOL) 500 MG tablet, Take 2 tablets (1,000 mg total) by mouth every 6 (six) hours., Disp: 30 tablet, Rfl: 0 .  ALPRAZolam (XANAX) 0.25 MG tablet, Take 0.25 mg by mouth every 6 (six) hours as needed for anxiety., Disp: , Rfl:  .  amLODipine-valsartan (EXFORGE) 5-160  MG tablet, Take 1 tablet by mouth daily., Disp: , Rfl:  .  aspirin EC 81 MG tablet, Take 81 mg by mouth daily., Disp: , Rfl:  .  atorvastatin (LIPITOR) 20 MG tablet, Take 20 mg by mouth every evening., Disp: , Rfl:  .  busPIRone (BUSPAR) 10 MG tablet, Take 10 mg by mouth 3 (three) times daily., Disp: , Rfl:  .  chlorpheniramine (CHLOR-TRIMETON) 4 MG tablet, Take 4 mg by mouth 3 (three) times daily as needed for allergies., Disp: , Rfl:  .  Dulaglutide (TRULICITY) 1.5 MG/0.5ML SOPN, Inject 1.5 mg into the skin every Friday., Disp: , Rfl:  .  EPINEPHrine 0.3 mg/0.3 mL IJ SOAJ injection, Inject 0.3 mg into the muscle as needed for anaphylaxis. , Disp: , Rfl:  .  famotidine (PEPCID) 20 MG tablet, Take 20 mg by mouth 2 (two) times daily., Disp: , Rfl:  .  finasteride (PROSCAR) 5 MG tablet, Take 5 mg by mouth every evening., Disp: , Rfl:  .  fluticasone (FLONASE) 50 MCG/ACT nasal spray, Place 2 sprays into both nostrils every evening. , Disp: , Rfl:  .  glipiZIDE (GLUCOTROL) 5 MG tablet, Take 5 mg by mouth 2 (two) times a day. , Disp: , Rfl:  .  ibuprofen (ADVIL,MOTRIN) 200 MG tablet, Take 400-600 mg by mouth daily as needed for fever or moderate pain., Disp: , Rfl:  .  levocetirizine (XYZAL) 5 MG tablet, Take 5 mg by mouth every evening., Disp: , Rfl:  .  Menthol, Topical Analgesic, (BIOFREEZE EX), Apply 1 application topically 4 (four) times daily as needed (pain.). , Disp: , Rfl:  .  metFORMIN (GLUCOPHAGE-XR) 500 MG 24 hr tablet, Take 1,000 mg by mouth 2 (two) times daily., Disp: , Rfl:  .  MYRBETRIQ 50 MG TB24 tablet, Take 50 mg by mouth every evening., Disp: , Rfl:  .  zolpidem (AMBIEN) 10 MG tablet, Take 7.5 mg by mouth at bedtime. , Disp: , Rfl:  Medication Side Effects: none  Family Medical/ Social History: Changes? No  MENTAL HEALTH EXAM:  There were no vitals taken for this visit.There is no height or weight on file to calculate BMI.  General Appearance: unable to assess  Eye Contact:   unable to assess  Speech:  Clear and Coherent  Volume:  Normal  Mood:  Anxious  Affect:  unable to assess  Thought Process:  Goal Directed  Orientation:  Full (Time, Place, and Person)  Thought Content: Logical   Suicidal Thoughts:  No  Homicidal Thoughts:  No  Memory:  WNL  Judgement:  Good  Insight:  Good  Psychomotor Activity:  unable to assess  Concentration:  Concentration: Good  Recall:  Good  Fund of Knowledge: Good  Language: Good  Assets:  Desire for Improvement  ADL's:  Intact  Cognition: WNL  Prognosis:  Good    DIAGNOSES:    ICD-10-CM   1. Generalized anxiety disorder  F41.1   2. Insomnia, unspecified type  G47.00  Receiving Psychotherapy: No    RECOMMENDATIONS:  We discussed different options for the anxiety.  Since the BuSpar seems to be working well, just not quite good enough, I recommend we increase it.  He has plenty of the 10 mg pills for now so we will use those up before giving him a new prescription. Increase Buspar to 10 mg, 2 p.o. twice daily routinely.  He may take 1 p.o. midday as needed. Continue Xanax 0.25 mg 1 every 6 hours as needed. Continue Ambien 5 to 10 mg nightly as needed sleep. Return in 6 weeks.  Donnal Moat, PA-C   This record has been created using [employer].  Chart creation errors have been sought, but may not always have been located and corrected. Such creation errors do not reflect on the standard of medical care.

## 2019-08-28 ENCOUNTER — Other Ambulatory Visit: Payer: Self-pay

## 2019-08-28 ENCOUNTER — Ambulatory Visit (INDEPENDENT_AMBULATORY_CARE_PROVIDER_SITE_OTHER): Payer: Medicare Other | Admitting: Physician Assistant

## 2019-08-28 ENCOUNTER — Encounter: Payer: Self-pay | Admitting: Physician Assistant

## 2019-08-28 DIAGNOSIS — F411 Generalized anxiety disorder: Secondary | ICD-10-CM | POA: Diagnosis not present

## 2019-08-28 DIAGNOSIS — G4700 Insomnia, unspecified: Secondary | ICD-10-CM

## 2019-08-28 MED ORDER — BUSPIRONE HCL 10 MG PO TABS: 20.0000 mg | ORAL_TABLET | Freq: Three times a day (TID) | ORAL | 1 refills | Status: DC

## 2019-08-28 NOTE — Progress Notes (Signed)
[facility]  Patient ID: Marc Gates,  MRN: [id number]  PCP: Lavone Orn, MD  Date of Evaluation: 08/28/2019 Time spent:15 minutes  Chief Complaint:  Chief Complaint    Anxiety; Follow-up     Virtual Visit via Telephone Note  I connected with patient by a video enabled telemedicine application or telephone, with their informed consent, and verified patient privacy and that I am speaking with the correct person using two identifiers.  I am private, in my office and the patient is on a walk.   I discussed the limitations, risks, security and privacy concerns of performing an evaluation and management service by telephone and the availability of in person appointments. I also discussed with the patient that there may be a patient responsible charge related to this service. The patient expressed understanding and agreed to proceed.   I discussed the assessment and treatment plan with the patient. The patient was provided an opportunity to ask questions and all were answered. The patient agreed with the plan and demonstrated an understanding of the instructions.   The patient was advised to call back or seek an in-person evaluation if the symptoms worsen or if the condition fails to improve as anticipated.  I provided 15 minutes of non-face-to-face time during this encounter.  HISTORY/CURRENT STATUS: HPI for 38-month med check.  He states he is doing very well.  The BuSpar is really helping.  He has had 2 panic attacks since we last talked about 6 months ago.  He uses the Xanax for that, given to him by another provider, which does help.  Since starting the BuSpar, the panic attacks have been less frequent as well as less severe.  He can often times abort them before they get severe.  Patient denies loss of interest in usual activities and is able to enjoy things.  Denies decreased energy or motivation.  Appetite has not changed.  No extreme sadness, tearfulness, or  feelings of hopelessness.  Denies any changes in concentration, making decisions or remembering things.  Denies suicidal or homicidal thoughts.  Due to the COVID, he has not been able to go anywhere or do much of anything.  He does walk about 6 miles a day which is good for him.  He is sleeping well.  He uses the Ambien, mostly because he has an overactive bladder and if he does not take it then he has to get up multiple times to urinate.  Denies dizziness, syncope, seizures, numbness, tingling, tremor, tics, unsteady gait, slurred speech, confusion. Denies muscle or joint pain, stiffness, or dystonia.  Individual Medical History/ Review of Systems: Changes? :No    Past medications for mental health diagnoses include: Zoloft ? Unsure of the antidpressant.  He only took 1 pill because he slept the whole next day.  Allergies: Penicillins  Current Medications:  Current Outpatient Medications:  .  acetaminophen (TYLENOL) 500 MG tablet, Take 2 tablets (1,000 mg total) by mouth every 6 (six) hours., Disp: 30 tablet, Rfl: 0 .  ALPRAZolam (XANAX) 0.25 MG tablet, Take 0.25 mg by mouth every 6 (six) hours as needed for anxiety., Disp: , Rfl:  .  amLODipine-valsartan (EXFORGE) 5-160 MG tablet, Take 1 tablet by mouth daily., Disp: , Rfl:  .  aspirin EC 81 MG tablet, Take 81 mg by mouth daily., Disp: , Rfl:  .  atorvastatin (LIPITOR) 20 MG tablet, Take 20 mg by mouth every evening., Disp: , Rfl:  .  chlorpheniramine (CHLOR-TRIMETON) 4 MG tablet, Take  4 mg by mouth 3 (three) times daily as needed for allergies., Disp: , Rfl:  .  Dulaglutide (TRULICITY) 1.5 MG/0.5ML SOPN, Inject 1.5 mg into the skin every Friday., Disp: , Rfl:  .  EPINEPHrine 0.3 mg/0.3 mL IJ SOAJ injection, Inject 0.3 mg into the muscle as needed for anaphylaxis. , Disp: , Rfl:  .  famotidine (PEPCID) 20 MG tablet, Take 20 mg by mouth 2 (two) times daily., Disp: , Rfl:  .  finasteride (PROSCAR) 5 MG tablet, Take 5 mg by mouth every  evening., Disp: , Rfl:  .  fluticasone (FLONASE) 50 MCG/ACT nasal spray, Place 2 sprays into both nostrils every evening. , Disp: , Rfl:  .  glipiZIDE (GLUCOTROL) 5 MG tablet, Take 5 mg by mouth 2 (two) times a day. , Disp: , Rfl:  .  ibuprofen (ADVIL,MOTRIN) 200 MG tablet, Take 400-600 mg by mouth daily as needed for fever or moderate pain., Disp: , Rfl:  .  levocetirizine (XYZAL) 5 MG tablet, Take 5 mg by mouth every evening., Disp: , Rfl:  .  Menthol, Topical Analgesic, (BIOFREEZE EX), Apply 1 application topically 4 (four) times daily as needed (pain.). , Disp: , Rfl:  .  metFORMIN (GLUCOPHAGE-XR) 500 MG 24 hr tablet, Take 1,000 mg by mouth 2 (two) times daily., Disp: , Rfl:  .  MYRBETRIQ 50 MG TB24 tablet, Take 50 mg by mouth every evening., Disp: , Rfl:  .  zolpidem (AMBIEN) 10 MG tablet, Take 7.5 mg by mouth at bedtime. , Disp: , Rfl:  .  busPIRone (BUSPAR) 10 MG tablet, Take 2 tablets (20 mg total) by mouth 3 (three) times daily., Disp: 540 tablet, Rfl: 1 Medication Side Effects: none  Family Medical/ Social History: Changes? No  MENTAL HEALTH EXAM:  There were no vitals taken for this visit.There is no height or weight on file to calculate BMI.  General Appearance: unable to assess  Eye Contact:  unable to assess  Speech:  Clear and Coherent  Volume:  Normal  Mood:  Euthymic  Affect:  unable to assess  Thought Process:  Goal Directed and Descriptions of Associations: Intact  Orientation:  Full (Time, Place, and Person)  Thought Content: Logical   Suicidal Thoughts:  No  Homicidal Thoughts:  No  Memory:  WNL  Judgement:  Good  Insight:  Good  Psychomotor Activity:  unable to assess  Concentration:  Concentration: Good  Recall:  Good  Fund of Knowledge: Good  Language: Good  Assets:  Desire for Improvement  ADL's:  Intact  Cognition: WNL  Prognosis:  Good    DIAGNOSES:    ICD-10-CM   1. Generalized anxiety disorder  F41.1   2. Insomnia, unspecified type  G47.00      Receiving Psychotherapy: No    RECOMMENDATIONS:  Continue BuSpar 10 mg, 2 p.o. 3 times daily.   Continue Xanax 0.25 mg 1 every 6 hours as needed, per PCP. Continue Ambien 5 to 10 mg nightly as needed sleep, per PCP. Return in 6 months.   Melony Overly, PA-C

## 2019-10-25 ENCOUNTER — Other Ambulatory Visit: Payer: Self-pay | Admitting: Internal Medicine

## 2019-10-25 ENCOUNTER — Ambulatory Visit
Admission: RE | Admit: 2019-10-25 | Discharge: 2019-10-25 | Disposition: A | Discharge status: Home / Self Care | Payer: Medicare Other | Source: Ambulatory Visit | Attending: Internal Medicine | Admitting: Internal Medicine

## 2019-10-25 DIAGNOSIS — R053 Chronic cough: Secondary | ICD-10-CM

## 2019-10-25 DIAGNOSIS — R05 Cough: Secondary | ICD-10-CM

## 2019-11-27 ENCOUNTER — Ambulatory Visit: Payer: Medicare PPO | Attending: Internal Medicine

## 2019-11-27 DIAGNOSIS — Z23 Encounter for immunization: Secondary | ICD-10-CM | POA: Insufficient documentation

## 2019-11-27 NOTE — Progress Notes (Signed)
   Covid-19 Vaccination Clinic  Name:  Marc Gates    MRN: [id number] DOB: September 04, 1951  11/27/2019  Marc Gates was observed post Covid-19 immunization for 15 minutes without incidence. He was provided with Vaccine Information Sheet and instruction to access the V-Safe system.   Marc Gates was instructed to call 911 with any severe reactions post vaccine: [name] Difficulty breathing  . Swelling of your face and throat  . A fast heartbeat  . A bad rash all over your body  . Dizziness and weakness    Immunizations Administered    Name Date Dose VIS Date Route   Pfizer COVID-19 Vaccine 11/27/2019  6:00 PM 0.3 mL 10/18/2019 Intramuscular   Manufacturer: [employer], [employer]   Lot: [id number]   NDC: [address]-8

## 2019-12-11 ENCOUNTER — Ambulatory Visit: Payer: Medicare Other

## 2019-12-16 ENCOUNTER — Ambulatory Visit: Payer: Medicare PPO | Attending: Internal Medicine

## 2019-12-16 DIAGNOSIS — Z23 Encounter for immunization: Secondary | ICD-10-CM

## 2019-12-16 NOTE — Progress Notes (Signed)
   Covid-19 Vaccination Clinic  Name:  Marc Gates    MRN: [id number] DOB: 1951-10-15  12/16/2019  Marc Gates was observed post Covid-19 immunization for 15 minutes without incidence. He was provided with Vaccine Information Sheet and instruction to access the V-Safe system.   Marc Gates was instructed to call 911 with any severe reactions post vaccine: [name] Difficulty breathing  . Swelling of your face and throat  . A fast heartbeat  . A bad rash all over your body  . Dizziness and weakness    Immunizations Administered    Name Date Dose VIS Date Route   Pfizer COVID-19 Vaccine 12/16/2019  1:25 PM 0.3 mL 10/18/2019 Intramuscular   Manufacturer: [employer], [employer]   Lot: [id number]   NDC: [address]-8

## 2019-12-18 ENCOUNTER — Ambulatory Visit: Payer: Medicare PPO

## 2019-12-31 ENCOUNTER — Ambulatory Visit: Payer: Medicare PPO | Admitting: Pulmonary Disease

## 2019-12-31 ENCOUNTER — Other Ambulatory Visit: Payer: Self-pay

## 2019-12-31 ENCOUNTER — Encounter: Payer: Self-pay | Admitting: Pulmonary Disease

## 2019-12-31 ENCOUNTER — Ambulatory Visit (INDEPENDENT_AMBULATORY_CARE_PROVIDER_SITE_OTHER): Payer: Medicare PPO

## 2019-12-31 VITALS — BP 120/76 | HR 100 | Temp 98.0°F | Ht 71.0 in | Wt 264.9 lb

## 2019-12-31 DIAGNOSIS — R0683 Snoring: Secondary | ICD-10-CM | POA: Diagnosis not present

## 2019-12-31 DIAGNOSIS — R0602 Shortness of breath: Secondary | ICD-10-CM

## 2019-12-31 LAB — CBC WITH DIFFERENTIAL/PLATELET
Basophils Absolute: 0.1 10*3/uL (ref 0.0–0.1)
Basophils Relative: 0.7 % (ref 0.0–3.0)
Eosinophils Absolute: 0.5 10*3/uL (ref 0.0–0.7)
Eosinophils Relative: 3.1 % (ref 0.0–5.0)
HCT: 47.4 % (ref 39.0–52.0)
Hemoglobin: 15.7 g/dL (ref 13.0–17.0)
Lymphocytes Relative: 19.8 % (ref 12.0–46.0)
Lymphs Abs: 3 10*3/uL (ref 0.7–4.0)
MCHC: 33 g/dL (ref 30.0–36.0)
MCV: 92.9 fl (ref 78.0–100.0)
Monocytes Absolute: 1.2 10*3/uL — ABNORMAL HIGH (ref 0.1–1.0)
Monocytes Relative: 7.6 % (ref 3.0–12.0)
Neutro Abs: 10.5 10*3/uL — ABNORMAL HIGH (ref 1.4–7.7)
Neutrophils Relative %: 68.8 % (ref 43.0–77.0)
Platelets: 256 10*3/uL (ref 150.0–400.0)
RBC: 5.11 Mil/uL (ref 4.22–5.81)
RDW: 12.2 % (ref 11.5–15.5)
WBC: 15.3 10*3/uL — ABNORMAL HIGH (ref 4.0–10.5)

## 2019-12-31 MED ORDER — BREZTRI AEROSPHERE 160-9-4.8 MCG/ACT IN AERO: 2.0000 | INHALATION_SPRAY | Freq: Two times a day (BID) | RESPIRATORY_TRACT | 0 refills | Status: DC

## 2019-12-31 MED ORDER — BREZTRI AEROSPHERE 160-9-4.8 MCG/ACT IN AERO: 2.0000 | INHALATION_SPRAY | Freq: Two times a day (BID) | RESPIRATORY_TRACT | 3 refills | Status: DC

## 2019-12-31 NOTE — Progress Notes (Signed)
Marc Gates    [id number]    [birth year]  Primary Care Physician:Griffin, Jonny Ruiz, MD  Referring Physician: Kirby Funk, MD 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  Occasional nonproductive cough.   He has been taken off valsartan CTC place primary care for a few weeks with no improvement in cough.  Has also start Pepcid and started Protonix for GERD treatment.  Started on Breo by his primary care a few weeks ago but stopped due to sore throat.  He has history of seasonal allergies, GERD, his daytime sleepiness with snoring.  He has been evaluated with sleep studies in the past which were negative. Started on Breo by his primary care but stopped due to sore throat.  Pets: Has a dog.  No cats, birds, farm animals Occupation: Retired Audiological scientist professor Exposures: No known exposures.  No mold, hot tub, Jacuzzi.  He has a down pillow and comforter which he had for many years Smoking history: 20-pack-year smoker.  Quit in 1985 Travel history: Recently lived in [place] and [place].  No significant recent travel Relevant family history: No significant family history of lung disease  Outpatient Encounter Medications as of 12/31/2019  Medication Sig  . ALPRAZolam (XANAX) 0.25 MG tablet Take 0.25 mg by mouth every 6 (six) hours as needed for anxiety.  [name] amLODipine-valsartan (EXFORGE) 5-160 MG tablet Take 1 tablet by mouth daily.  [name] aspirin EC 81 MG tablet Take 81 mg by mouth daily.  [name] atorvastatin (LIPITOR) 20 MG tablet Take 20 mg by mouth every evening.  . benzonatate (TESSALON) 200 MG capsule Take 200 mg by mouth 3 (three) times daily as needed.   . busPIRone (BUSPAR) 10 MG tablet Take 2 tablets  (20 mg total) by mouth 3 (three) times daily.  . chlorpheniramine (CHLOR-TRIMETON) 4 MG tablet Take 4 mg by mouth 3 (three) times daily as needed for allergies.  . Dulaglutide (TRULICITY) 1.5 MG/0.5ML SOPN Inject 1.5 mg into the skin every Friday.  [name] EPINEPHrine 0.3 mg/0.3 mL IJ SOAJ injection Inject 0.3 mg into the muscle as needed for anaphylaxis.   . finasteride (PROSCAR) 5 MG tablet Take 5 mg by mouth every evening.  . fluticasone (FLONASE) 50 MCG/ACT nasal spray Place 2 sprays into both nostrils every evening.   [name] glipiZIDE (GLUCOTROL) 5 MG tablet Take 5 mg by mouth 2 (two) times a day.   . levalbuterol (XOPENEX HFA) 45 MCG/ACT inhaler Inhale 2 puffs into the lungs every 6 (six) hours as needed.   [name] levocetirizine (XYZAL) 5 MG tablet Take 5 mg by mouth every evening.  . Menthol, Topical Analgesic, (BIOFREEZE EX) Apply 1 application topically 4 (four) times daily as needed (pain.).   [name] metFORMIN (GLUCOPHAGE-XR) 500 MG 24 hr tablet Take 1,000 mg by mouth 2 (two) times daily.  . montelukast (SINGULAIR) 10 MG tablet Take 10 mg by mouth at bedtime.  [name] MYRBETRIQ 50 MG TB24 tablet Take 50 mg by mouth every evening.  . pantoprazole (PROTONIX) 40 MG tablet Take 40 mg by mouth 2 (two) times daily.   [name] zolpidem (AMBIEN) 10 MG tablet Take 7.5 mg by mouth at bedtime.   . [DISCONTINUED] acetaminophen (TYLENOL) 500 MG tablet Take 2 tablets (  1,000 mg total) by mouth every 6 (six) hours.  . [DISCONTINUED] famotidine (PEPCID) 20 MG tablet Take 20 mg by mouth 2 (two) times daily.  . [DISCONTINUED] ibuprofen (ADVIL,MOTRIN) 200 MG tablet Take 400-600 mg by mouth daily as needed for fever or moderate pain.   No facility-administered encounter medications on file as of 12/31/2019.    Allergies as of 12/31/2019 - Review Complete 12/31/2019  Allergen Reaction Noted  . Penicillins Other (See Comments) 07/22/2013    Past Medical History:  Diagnosis Date  . Anxiety   . Diabetes mellitus without complication  ([place])   . GERD (gastroesophageal reflux disease)   . Hepatitis 1980'S   HEPATITIS B  . High cholesterol   . Hypertension   . Pneumonia     Past Surgical History:  Procedure Laterality Date  . APPENDECTOMY  AGE 20 OR 13  . COLONOSCOPY WITH PROPOFOL N/A 08/13/2013   Procedure: COLONOSCOPY WITH PROPOFOL;  Surgeon: Garlan Fair, MD;  Location: [facility];  Service: Endoscopy;  Laterality: N/A;  . DECORTICATION Right 04/03/2019   Procedure: DECORTICATION OF RIGHT LUNG;  Surgeon: Gaye Pollack, MD;  Location: [facility];  Service: Thoracic;  Laterality: Right;  . EMPYEMA DRAINAGE Right 04/03/2019   Procedure: EMPYEMA DRAINAGE;  Surgeon: Gaye Pollack, MD;  Location: [facility];  Service: Thoracic;  Laterality: Right;  . EYE SURGERY Left    retinal detachment and cataract surgery  . HERNIA REPAIR  20 WEEKS OLD  . IR THORACENTESIS ASP PLEURAL SPACE W/IMG GUIDE  03/07/2019  . PAIN PUMP IMPLANTATION  04/03/2019   Procedure: PLACEMENT OF ON-Q PAIN PUMP;  Surgeon: Gaye Pollack, MD;  Location: [facility];  Service: Thoracic;;  . THORACOTOMY Right 04/03/2019   Procedure: THORACOTOMY MAJOR;  Surgeon: Gaye Pollack, MD;  Location: [facility];  Service: Thoracic;  Laterality: Right;    History reviewed. No pertinent family history.  Social History   Socioeconomic History  . Marital status: Single    Spouse name: Not on file  . Number of children: Not on file  . Years of education: Not on file  . Highest education level: Not on file  Occupational History  . Not on file  Tobacco Use  . Smoking status: Former Smoker    Packs/day: 2.00    Years: 10.00    Pack years: 20.00    Types: Cigarettes  . Smokeless tobacco: Never Used  Substance and Sexual Activity  . Alcohol use: Yes    Comment: rare  . Drug use: No    Comment: QUIT SMOKING 25 YRS AGO  . Sexual activity: Not on file  Other Topics Concern  . Not on file  Social History Narrative  . Not on file   Social Determinants of Health    Financial Resource Strain:   . Difficulty of Paying Living Expenses: Not on file  Food Insecurity:   . Worried About Charity fundraiser in the Last Year: Not on file  . Ran Out of Food in the Last Year: Not on file  Transportation Needs:   . Lack of Transportation (Medical): Not on file  . Lack of Transportation (Non-Medical): Not on file  Physical Activity:   . Days of Exercise per Week: Not on file  . Minutes of Exercise per Session: Not on file  Stress:   . Feeling of Stress : Not on file  Social Connections:   . Frequency of Communication with Friends and Family: Not on file  .  Frequency of Social Gatherings with Friends and Family: Not on file  . Attends Religious Services: Not on file  . Active Member of Clubs or Organizations: Not on file  . Attends Banker Meetings: Not on file  . Marital Status: Not on file  Intimate Partner Violence:   . Fear of Current or Ex-Partner: Not on file  . Emotionally Abused: Not on file  . Physically Abused: Not on file  . Sexually Abused: Not on file    Review of systems: Review of Systems  Constitutional: Negative for fever and chills.  HENT: Negative.   Eyes: Negative for blurred vision.  Respiratory: as per HPI  Cardiovascular: Negative for chest pain and palpitations.  Gastrointestinal: Negative for vomiting, diarrhea, blood per rectum. Genitourinary: Negative for dysuria, urgency, frequency and hematuria.  Musculoskeletal: Negative for myalgias, back pain and joint pain.  Skin: Negative for itching and rash.  Neurological: Negative for dizziness, tremors, focal weakness, seizures and loss of consciousness.  Endo/Heme/Allergies: Negative for environmental allergies.  Psychiatric/Behavioral: Negative for depression, suicidal ideas and hallucinations.  All other systems reviewed and are negative.  Physical Exam: Blood pressure 120/76, pulse 100, temperature 98 F (36.7 C), temperature source Temporal, height 5'  11" (1.803 m), weight 264 lb 13.9 oz (120.1 kg), SpO2 96 %. Gen:      No acute distress HEENT:  EOMI, sclera anicteric Neck:     No masses; no thyromegaly Lungs:    Clear to auscultation bilaterally; normal respiratory effort CV:         Regular rate and rhythm; no murmurs Abd:      + bowel sounds; soft, non-tender; no palpable masses, no distension Ext:    No edema; adequate peripheral perfusion Skin:      Warm and dry; no rash Neuro: alert and oriented x 3 Psych: normal mood and affect  Data Reviewed: Imaging: CT chest 03/28/2019-chronic appearing right-sided effusion with pleural thickening, multiple pulmonary nodules.  I have reviewed the images personally.  Assessment:  Evaluation for dyspnea May be multifactorial with asthma, possible interstitial lung disease with unclear exposure Has underlying GERD, allergies and possibly sleep apnea given significant symptoms of snoring  We will get a chest x-ray today and schedule high-res CT in the next 2 weeks for evaluation of ILD Check basic labs including CBC differential, IgE, HP panel and basic CTD profile Start Breo as he is not tolerating the dry powder inhaler and start Breztri Schedule pulmonary function test and sleep  Plan/Recommendations: High-res CT Labs  Trial of breztri PFTs, sleep study  This appointment required  50 minutes of patient care (this includes precharting, chart review, review of results, face-to-face care, etc.).  Chilton Greathouse MD [facility] 12/31/2019, 11:44 AM  CC: Kirby Funk, MD

## 2019-12-31 NOTE — Patient Instructions (Signed)
We will stop the [facility] and start you on an inhaler called Breztri Check CBC differential, ANA, rheumatoid factor, CCP, hypersensitivity panel, IgE today X-ray today and schedule high-res CT Schedule pulmonary function test and in lab sleep study  Follow-up in 1 to 2 months.

## 2020-01-02 ENCOUNTER — Telehealth: Payer: Self-pay | Admitting: Pulmonary Disease

## 2020-01-02 LAB — ANA,IFA RA DIAG PNL W/RFLX TIT/PATN
Anti Nuclear Antibody (ANA): NEGATIVE
Cyclic Citrullin Peptide Ab: <16 UNITS
Rheumatoid fact SerPl-aCnc: <14 IU/mL (ref <14)

## 2020-01-02 LAB — IGE: IgE (Immunoglobulin E), Serum: 31 kU/L (ref <=114)

## 2020-01-03 LAB — HYPERSENSITIVITY PNEUMONITIS
A. Pullulans Abs: NEGATIVE
A.Fumigatus #1 Abs: NEGATIVE
Micropolyspora faeni, IgG: NEGATIVE
Pigeon Serum Abs: NEGATIVE
Thermoact. Saccharii: NEGATIVE
Thermoactinomyces vulgaris, IgG: NEGATIVE

## 2020-01-06 DIAGNOSIS — J301 Allergic rhinitis due to pollen: Secondary | ICD-10-CM | POA: Diagnosis not present

## 2020-01-06 NOTE — Telephone Encounter (Signed)
Informed patient no PFT appt.'s before he is scheduled.  Patient understood and nothing further needed.

## 2020-01-06 NOTE — Telephone Encounter (Signed)
Checked PFT scheduled, there aren't any available appointments before the day he is already scheduled. LMTCB x1 for pt.

## 2020-01-10 ENCOUNTER — Ambulatory Visit
Admission: RE | Admit: 2020-01-10 | Discharge: 2020-01-10 | Disposition: A | Discharge status: Home / Self Care | Payer: Medicare PPO | Source: Ambulatory Visit | Attending: Pulmonary Disease | Admitting: Pulmonary Disease

## 2020-01-10 DIAGNOSIS — R918 Other nonspecific abnormal finding of lung field: Secondary | ICD-10-CM | POA: Diagnosis not present

## 2020-01-10 DIAGNOSIS — R0602 Shortness of breath: Secondary | ICD-10-CM

## 2020-01-13 DIAGNOSIS — E1351 Other specified diabetes mellitus with diabetic peripheral angiopathy without gangrene: Secondary | ICD-10-CM | POA: Diagnosis not present

## 2020-01-13 DIAGNOSIS — L602 Onychogryphosis: Secondary | ICD-10-CM | POA: Diagnosis not present

## 2020-01-13 DIAGNOSIS — L84 Corns and callosities: Secondary | ICD-10-CM | POA: Diagnosis not present

## 2020-01-13 DIAGNOSIS — J301 Allergic rhinitis due to pollen: Secondary | ICD-10-CM | POA: Diagnosis not present

## 2020-01-14 ENCOUNTER — Other Ambulatory Visit (HOSPITAL_COMMUNITY)
Admission: RE | Admit: 2020-01-14 | Discharge: 2020-01-14 | Disposition: A | Discharge status: Home / Self Care | Payer: Medicare PPO | Source: Ambulatory Visit | Attending: Pulmonary Disease | Admitting: Pulmonary Disease

## 2020-01-14 DIAGNOSIS — Z20822 Contact with and (suspected) exposure to covid-19: Secondary | ICD-10-CM | POA: Diagnosis not present

## 2020-01-14 DIAGNOSIS — Z01812 Encounter for preprocedural laboratory examination: Secondary | ICD-10-CM | POA: Diagnosis not present

## 2020-01-15 LAB — SARS CORONAVIRUS 2 (TAT 6-24 HRS): SARS Coronavirus 2: NEGATIVE

## 2020-01-17 ENCOUNTER — Other Ambulatory Visit: Payer: Self-pay

## 2020-01-17 ENCOUNTER — Ambulatory Visit (HOSPITAL_BASED_OUTPATIENT_CLINIC_OR_DEPARTMENT_OTHER): Payer: Medicare PPO | Attending: Pulmonary Disease | Admitting: Pulmonary Disease

## 2020-01-17 DIAGNOSIS — G4733 Obstructive sleep apnea (adult) (pediatric): Secondary | ICD-10-CM | POA: Insufficient documentation

## 2020-01-17 DIAGNOSIS — R0683 Snoring: Secondary | ICD-10-CM | POA: Insufficient documentation

## 2020-01-20 ENCOUNTER — Other Ambulatory Visit (HOSPITAL_BASED_OUTPATIENT_CLINIC_OR_DEPARTMENT_OTHER): Payer: Self-pay

## 2020-01-20 DIAGNOSIS — R0683 Snoring: Secondary | ICD-10-CM

## 2020-01-20 DIAGNOSIS — J301 Allergic rhinitis due to pollen: Secondary | ICD-10-CM | POA: Diagnosis not present

## 2020-01-21 DIAGNOSIS — R0683 Snoring: Secondary | ICD-10-CM | POA: Diagnosis not present

## 2020-01-21 NOTE — Procedures (Signed)
    Patient Name: Marc Gates, Marc Gates Date: 01/17/2020 Gender: Male D.O.B: 08/25/1951 Age (years): 72 Referring Provider: Chilton Greathouse Height (inches): 71 Interpreting Physician: Coralyn Helling MD, ABSM Weight (lbs): 262 RPSGT: Rolene Arbour BMI: 37 MRN: [id number] Neck Size: 17.15  CLINICAL INFORMATION Sleep Study Type: NPSG  Indication for sleep study: Snoring  Epworth Sleepiness Score: 6  SLEEP STUDY TECHNIQUE As per the AASM Manual for the Scoring of Sleep and Associated Events v2.3 (April 2016) with a hypopnea requiring 4% desaturations.  The channels recorded and monitored were frontal, central and occipital EEG, electrooculogram (EOG), submentalis EMG (chin), nasal and oral airflow, thoracic and abdominal wall motion, anterior tibialis EMG, snore microphone, electrocardiogram, and pulse oximetry.  MEDICATIONS Medications self-administered by patient taken the night of the study : AMBIEN  SLEEP ARCHITECTURE The study was initiated at 9:52:29 PM and ended at 4:05:23 AM.  Sleep onset time was 45.8 minutes and the sleep efficiency was 70.2%%. The total sleep time was 261.6 minutes.  Stage REM latency was 84.0 minutes.  The patient spent 2.5%% of the night in stage N1 sleep, 87.8%% in stage N2 sleep, 0.0%% in stage N3 and 9.8% in REM.  Alpha intrusion was absent.  Supine sleep was 0.00%.  RESPIRATORY PARAMETERS The overall apnea/hypopnea index (AHI) was 9.4 per hour. There were 2 total apneas, including 2 obstructive, 0 central and 0 mixed apneas. There were 39 hypopneas and 59 RERAs.  The AHI during Stage REM sleep was 7.1 per hour.  AHI while supine was N/A per hour.  The mean oxygen saturation was 92.9%. The minimum SpO2 during sleep was 87.0%.  moderate snoring was noted during this study.  CARDIAC DATA The 2 lead EKG demonstrated sinus rhythm. The mean heart rate was 84.4 beats per minute. Other EKG findings include: None.  LEG MOVEMENT DATA The  total PLMS were 0 with a resulting PLMS index of 0.0. Associated arousal with leg movement index was 4.6 .  IMPRESSIONS - Mild obstructive sleep apnea with an AHI of 9.4 and SpO2 low of 87%.  DIAGNOSIS - Obstructive Sleep Apnea (327.23 [G47.33 ICD-10])  RECOMMENDATIONS - Additional therapies include weight loss, CPAP, oral appliance, or surgical assessment.  [Electronically signed] 01/21/2020 03:36 PM  Coralyn Helling MD, ABSM Diplomate, American Board of Sleep Medicine   NPI: [id number]

## 2020-01-23 ENCOUNTER — Ambulatory Visit: Payer: Medicare PPO

## 2020-01-24 ENCOUNTER — Ambulatory Visit: Payer: Medicare PPO

## 2020-01-27 ENCOUNTER — Telehealth: Payer: Self-pay | Admitting: Pulmonary Disease

## 2020-01-27 DIAGNOSIS — J301 Allergic rhinitis due to pollen: Secondary | ICD-10-CM | POA: Diagnosis not present

## 2020-01-27 DIAGNOSIS — G4733 Obstructive sleep apnea (adult) (pediatric): Secondary | ICD-10-CM

## 2020-01-27 NOTE — Telephone Encounter (Signed)
I called and discussed the results of CT, labs and sleep study with patient  Please order CPAP autoset 5-15 cm and put on recall list for clinic follow up after PFTs on May. Send copy of sleep test to patient as he cannot access it in [employer]

## 2020-01-27 NOTE — Telephone Encounter (Signed)
CPAP ordered placed, recall entered and copy of sleep study mailed to patient.

## 2020-02-10 DIAGNOSIS — J301 Allergic rhinitis due to pollen: Secondary | ICD-10-CM | POA: Diagnosis not present

## 2020-02-11 DIAGNOSIS — G4733 Obstructive sleep apnea (adult) (pediatric): Secondary | ICD-10-CM | POA: Diagnosis not present

## 2020-02-14 DIAGNOSIS — J301 Allergic rhinitis due to pollen: Secondary | ICD-10-CM | POA: Diagnosis not present

## 2020-02-14 DIAGNOSIS — R05 Cough: Secondary | ICD-10-CM | POA: Diagnosis not present

## 2020-02-17 DIAGNOSIS — J301 Allergic rhinitis due to pollen: Secondary | ICD-10-CM | POA: Diagnosis not present

## 2020-02-18 DIAGNOSIS — H6982 Other specified disorders of Eustachian tube, left ear: Secondary | ICD-10-CM | POA: Diagnosis not present

## 2020-02-19 DIAGNOSIS — J301 Allergic rhinitis due to pollen: Secondary | ICD-10-CM | POA: Diagnosis not present

## 2020-02-24 DIAGNOSIS — J301 Allergic rhinitis due to pollen: Secondary | ICD-10-CM | POA: Diagnosis not present

## 2020-02-26 ENCOUNTER — Ambulatory Visit (INDEPENDENT_AMBULATORY_CARE_PROVIDER_SITE_OTHER): Payer: Medicare PPO | Admitting: Physician Assistant

## 2020-02-26 ENCOUNTER — Encounter: Payer: Self-pay | Admitting: Physician Assistant

## 2020-02-26 DIAGNOSIS — G4733 Obstructive sleep apnea (adult) (pediatric): Secondary | ICD-10-CM | POA: Diagnosis not present

## 2020-02-26 DIAGNOSIS — J301 Allergic rhinitis due to pollen: Secondary | ICD-10-CM | POA: Diagnosis not present

## 2020-02-26 DIAGNOSIS — F411 Generalized anxiety disorder: Secondary | ICD-10-CM | POA: Diagnosis not present

## 2020-02-26 DIAGNOSIS — G4700 Insomnia, unspecified: Secondary | ICD-10-CM

## 2020-02-26 MED ORDER — BUSPIRONE HCL 10 MG PO TABS: 20.0000 mg | ORAL_TABLET | Freq: Three times a day (TID) | ORAL | 1 refills | Status: DC

## 2020-02-26 NOTE — Progress Notes (Signed)
[facility]  Patient ID: Marc Gates,  MRN: [id number]  PCP: Lavone Orn, MD  Date of Evaluation: 02/26/2020 Time spent:20 minutes  Chief Complaint:  Chief Complaint    Anxiety     Virtual Visit via Telephone Note  I connected with patient by a video enabled telemedicine application or telephone, with their informed consent, and verified patient privacy and that I am speaking with the correct person using two identifiers.  I am private, in my office and the patient is home.  I discussed the limitations, risks, security and privacy concerns of performing an evaluation and management service by video and the availability of in person appointments. I also discussed with the patient that there may be a patient responsible charge related to this service. The patient expressed understanding and agreed to proceed.  We attempted a video visit, but had technical difficulties on his and.  We proceeded by phone.   I discussed the assessment and treatment plan with the patient. The patient was provided an opportunity to ask questions and all were answered. The patient agreed with the plan and demonstrated an understanding of the instructions.   The patient was advised to call back or seek an in-person evaluation if the symptoms worsen or if the condition fails to improve as anticipated.  I provided 20 minutes of non-face-to-face time during this encounter.  HISTORY/CURRENT STATUS: HPI For routine med check.  Doing well overall. Has been dx w/ OSA and using CPAP for 2 weeks.  Has gotten accustomed to it already  Feels much less sleepy during the day.  He is still using the Ambien every night but feels that once he has been on the CPAP a while longer, he may not need the Ambien.  He continues to have anxiety attacks for no reason.  They can come out of the blue.  He does everything in his power to stop them.  He will usually take half of a Xanax which would be 0.125 mg and go for a  walk and within 30 minutes he is already feeling better.  He gets the Xanax from his PCP.  He is able to enjoy things.  Energy and motivation are good.  Not isolating.  He is continuing to walk daily and is trying to lose weight so he can "get off some of these medications."  No suicidal or homicidal thoughts.  Denies dizziness, syncope, seizures, numbness, tingling, tremor, tics, unsteady gait, slurred speech, confusion. Denies muscle or joint pain, stiffness, or dystonia.  Individual Medical History/ Review of Systems: Changes? :Yes  now uses a CPAP. Mild sleep apnea.   Past medications for mental health diagnoses include: Zoloft ? Unsure of the antidpressant.  He only took 1 pill because he slept the whole next day.  Allergies: Penicillins  Current Medications:  Current Outpatient Medications:  .  ALPRAZolam (XANAX) 0.25 MG tablet, Take 0.25 mg by mouth every 6 (six) hours as needed for anxiety., Disp: , Rfl:  .  amLODipine-valsartan (EXFORGE) 5-160 MG tablet, Take 1 tablet by mouth daily., Disp: , Rfl:  .  aspirin EC 81 MG tablet, Take 81 mg by mouth daily., Disp: , Rfl:  .  atorvastatin (LIPITOR) 20 MG tablet, Take 20 mg by mouth every evening., Disp: , Rfl:  .  benzonatate (TESSALON) 200 MG capsule, Take 200 mg by mouth 3 (three) times daily as needed. , Disp: , Rfl:  .  Budeson-Glycopyrrol-Formoterol (BREZTRI AEROSPHERE) 160-9-4.8 MCG/ACT AERO, Inhale 2 puffs into the  lungs 2 (two) times daily., Disp: 10.7 g, Rfl: 0 .  Budeson-Glycopyrrol-Formoterol (BREZTRI AEROSPHERE) 160-9-4.8 MCG/ACT AERO, Inhale 2 puffs into the lungs 2 (two) times daily., Disp: 10.7 g, Rfl: 3 .  busPIRone (BUSPAR) 10 MG tablet, Take 2 tablets (20 mg total) by mouth 3 (three) times daily., Disp: 540 tablet, Rfl: 1 .  chlorpheniramine (CHLOR-TRIMETON) 4 MG tablet, Take 4 mg by mouth 3 (three) times daily as needed for allergies., Disp: , Rfl:  .  Dulaglutide (TRULICITY) 1.5 MG/0.5ML SOPN, Inject 1.5 mg into the  skin every Friday., Disp: , Rfl:  .  EPINEPHrine 0.3 mg/0.3 mL IJ SOAJ injection, Inject 0.3 mg into the muscle as needed for anaphylaxis. , Disp: , Rfl:  .  finasteride (PROSCAR) 5 MG tablet, Take 5 mg by mouth every evening., Disp: , Rfl:  .  fluticasone (FLONASE) 50 MCG/ACT nasal spray, Place 2 sprays into both nostrils every evening. , Disp: , Rfl:  .  glipiZIDE (GLUCOTROL) 5 MG tablet, Take 5 mg by mouth 2 (two) times a day. , Disp: , Rfl:  .  levalbuterol (XOPENEX HFA) 45 MCG/ACT inhaler, Inhale 2 puffs into the lungs every 6 (six) hours as needed. , Disp: , Rfl:  .  levocetirizine (XYZAL) 5 MG tablet, Take 5 mg by mouth every evening., Disp: , Rfl:  .  Menthol, Topical Analgesic, (BIOFREEZE EX), Apply 1 application topically 4 (four) times daily as needed (pain.). , Disp: , Rfl:  .  metFORMIN (GLUCOPHAGE-XR) 500 MG 24 hr tablet, Take 1,000 mg by mouth 2 (two) times daily., Disp: , Rfl:  .  montelukast (SINGULAIR) 10 MG tablet, Take 10 mg by mouth at bedtime., Disp: , Rfl:  .  MYRBETRIQ 50 MG TB24 tablet, Take 50 mg by mouth every evening., Disp: , Rfl:  .  pantoprazole (PROTONIX) 40 MG tablet, Take 40 mg by mouth 2 (two) times daily. , Disp: , Rfl:  .  zolpidem (AMBIEN) 10 MG tablet, Take 7.5 mg by mouth at bedtime. , Disp: , Rfl:  Medication Side Effects: none  Family Medical/ Social History: Changes? No  MENTAL HEALTH EXAM:  There were no vitals taken for this visit.There is no height or weight on file to calculate BMI.  General Appearance: Unable to assess  Eye Contact:  Unable to assess  Speech:  Clear and Coherent and Normal Rate  Volume:  Normal  Mood:  Euthymic  Affect:  Unable to assess  Thought Process:  Goal Directed and Descriptions of Associations: Intact  Orientation:  Full (Time, Place, and Person)  Thought Content: Logical   Suicidal Thoughts:  No  Homicidal Thoughts:  No  Memory:  WNL  Judgement:  Good  Insight:  Good  Psychomotor Activity:  Unable to assess   Concentration:  Concentration: Good  Recall:  Good  Fund of Knowledge: Good  Language: Good  Assets:  Desire for Improvement  ADL's:  Intact  Cognition: WNL  Prognosis:  Good    DIAGNOSES:    ICD-10-CM   1. Generalized anxiety disorder  F41.1   2. Insomnia, unspecified type  G47.00   3. Obstructive sleep apnea  G47.33     Receiving Psychotherapy: No    RECOMMENDATIONS:  PDMP was reviewed. I spent 20 minutes with him. Continue BuSpar 10 mg, 2 p.o. 3 times daily. Continue Xanax 0.25 mg, 1/2-1 p.o. every 6 hours as needed.  Provided by his PCP. Continue Ambien 10 mg, 1-2 nightly as needed sleep.  Provided by PCP. Return  in 6 months.  Donnal Moat, PA-C

## 2020-03-02 DIAGNOSIS — J3089 Other allergic rhinitis: Secondary | ICD-10-CM | POA: Diagnosis not present

## 2020-03-02 DIAGNOSIS — J301 Allergic rhinitis due to pollen: Secondary | ICD-10-CM | POA: Diagnosis not present

## 2020-03-07 ENCOUNTER — Other Ambulatory Visit (HOSPITAL_COMMUNITY)
Admission: RE | Admit: 2020-03-07 | Discharge: 2020-03-07 | Disposition: A | Discharge status: Home / Self Care | Payer: Medicare PPO | Source: Ambulatory Visit | Attending: Pulmonary Disease | Admitting: Pulmonary Disease

## 2020-03-07 DIAGNOSIS — Z20822 Contact with and (suspected) exposure to covid-19: Secondary | ICD-10-CM | POA: Insufficient documentation

## 2020-03-07 DIAGNOSIS — Z01812 Encounter for preprocedural laboratory examination: Secondary | ICD-10-CM | POA: Insufficient documentation

## 2020-03-07 LAB — SARS CORONAVIRUS 2 (TAT 6-24 HRS): SARS Coronavirus 2: NEGATIVE

## 2020-03-11 ENCOUNTER — Ambulatory Visit (INDEPENDENT_AMBULATORY_CARE_PROVIDER_SITE_OTHER): Payer: Medicare PPO | Admitting: Pulmonary Disease

## 2020-03-11 ENCOUNTER — Encounter: Payer: Self-pay | Admitting: Pulmonary Disease

## 2020-03-11 ENCOUNTER — Ambulatory Visit: Payer: Medicare PPO | Admitting: Pulmonary Disease

## 2020-03-11 ENCOUNTER — Other Ambulatory Visit: Payer: Self-pay

## 2020-03-11 DIAGNOSIS — R0602 Shortness of breath: Secondary | ICD-10-CM | POA: Diagnosis not present

## 2020-03-11 DIAGNOSIS — R918 Other nonspecific abnormal finding of lung field: Secondary | ICD-10-CM

## 2020-03-11 DIAGNOSIS — J301 Allergic rhinitis due to pollen: Secondary | ICD-10-CM | POA: Diagnosis not present

## 2020-03-11 LAB — PULMONARY FUNCTION TEST
DL/VA % pred: 114 %
DL/VA: 4.64 ml/min/mmHg/L
DLCO cor % pred: 116 %
DLCO cor: 31.6 ml/min/mmHg
DLCO unc % pred: 116 %
DLCO unc: 31.6 ml/min/mmHg
FEF 25-75 Post: 3.19 L/sec
FEF 25-75 Pre: 3.3 L/sec
FEF2575-%Change-Post: -3 %
FEF2575-%Pred-Post: 119 %
FEF2575-%Pred-Pre: 123 %
FEV1-%Change-Post: -1 %
FEV1-%Pred-Post: 96 %
FEV1-%Pred-Pre: 98 %
FEV1-Post: 3.35 L
FEV1-Pre: 3.41 L
FEV1FVC-%Change-Post: 2 %
FEV1FVC-%Pred-Pre: 108 %
FEV6-%Change-Post: -3 %
FEV6-%Pred-Post: 92 %
FEV6-%Pred-Pre: 95 %
FEV6-Post: 4.07 L
FEV6-Pre: 4.24 L
FEV6FVC-%Pred-Post: 105 %
FEV6FVC-%Pred-Pre: 105 %
FVC-%Change-Post: -3 %
FVC-%Pred-Post: 87 %
FVC-%Pred-Pre: 90 %
FVC-Post: 4.07 L
FVC-Pre: 4.24 L
Post FEV1/FVC ratio: 82 %
Post FEV6/FVC ratio: 100 %
Pre FEV1/FVC ratio: 80 %
Pre FEV6/FVC Ratio: 100 %
RV % pred: 95 %
RV: 2.35 L
TLC % pred: 89 %
TLC: 6.46 L

## 2020-03-11 NOTE — Patient Instructions (Signed)
I have reviewed the pulmonary function test which shows normal lung function which is good news Your CT scan shows some small lung nodules which can be followed up with CT without contrast in 1 year There is no evidence of interstitial lung disease There is evidence of plaque buildup in the vessels around the heart.  Please follow-up with your primary care regarding this.  Continue the CPAP Follow-up in 1 month to review download for insurance requirements to ensure compliance.

## 2020-03-11 NOTE — Progress Notes (Signed)
Full PFT performed today. °

## 2020-03-11 NOTE — Progress Notes (Signed)
Marc Gates    [id number]    1951-05-23  Primary Care Physician:Griffin, Jonny Ruiz, MD  Referring Physician: Kirby Funk, MD 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  Occasional nonproductive cough.   He has been taken off valsartan by primary care for a few weeks with no improvement in cough.  Has also start Pepcid and started Protonix for GERD treatment.  Started on Breo by his primary care a few weeks ago but stopped due to sore throat.  He has history of seasonal allergies, GERD, his daytime sleepiness with snoring.  He has been evaluated with sleep studies in the past which were negative. Started on Breo by his primary care but stopped due to sore throat.  Pets: Has a dog.  No cats, birds, farm animals Occupation: Retired Audiological scientist professor Exposures: No known exposures.  No mold, hot tub, Jacuzzi.  He has a down pillow and comforter which he had for many years Smoking history: 20-pack-year smoker.  Quit in 1985 Travel history: Recently lived in [place] and [place].  No significant recent travel Relevant family history: No significant family history of lung disease  Interim history: States that breathing is doing well with no issues.  Here for review of CT and PFTs.  Outpatient Encounter Medications as of 03/11/2020  Medication Sig  . ALPRAZolam (XANAX) 0.25 MG tablet Take 0.25 mg by mouth every 6 (six) hours as needed for anxiety.  [name] amLODipine-valsartan (EXFORGE) 5-160 MG tablet Take 1 tablet by mouth daily.  [name] aspirin EC 81 MG tablet Take 81 mg by mouth daily.  [name] atorvastatin (LIPITOR) 20 MG tablet Take 20 mg by mouth every evening.  . benzonatate (TESSALON) 200 MG  capsule Take 200 mg by mouth 3 (three) times daily as needed.   . Budeson-Glycopyrrol-Formoterol (BREZTRI AEROSPHERE) 160-9-4.8 MCG/ACT AERO Inhale 2 puffs into the lungs 2 (two) times daily.  . Budeson-Glycopyrrol-Formoterol (BREZTRI AEROSPHERE) 160-9-4.8 MCG/ACT AERO Inhale 2 puffs into the lungs 2 (two) times daily.  . busPIRone (BUSPAR) 10 MG tablet Take 2 tablets (20 mg total) by mouth 3 (three) times daily.  . chlorpheniramine (CHLOR-TRIMETON) 4 MG tablet Take 4 mg by mouth 3 (three) times daily as needed for allergies.  . Dulaglutide (TRULICITY) 1.5 MG/0.5ML SOPN Inject 1.5 mg into the skin every Friday.  [name] EPINEPHrine 0.3 mg/0.3 mL IJ SOAJ injection Inject 0.3 mg into the muscle as needed for anaphylaxis.   . finasteride (PROSCAR) 5 MG tablet Take 5 mg by mouth every evening.  . fluticasone (FLONASE) 50 MCG/ACT nasal spray Place 2 sprays into both nostrils every evening.   [name] glipiZIDE (GLUCOTROL) 5 MG tablet Take 5 mg by mouth 2 (two) times a day.   . levalbuterol (XOPENEX HFA) 45 MCG/ACT inhaler Inhale 2 puffs into the lungs every 6 (six) hours as needed.   [name] levocetirizine (XYZAL) 5 MG tablet Take 5 mg by mouth every evening.  . Menthol, Topical Analgesic, (BIOFREEZE EX) Apply 1 application topically 4 (four) times daily as needed (pain.).   [name] metFORMIN (GLUCOPHAGE-XR) 500 MG 24 hr tablet Take 1,000 mg by mouth 2 (two) times daily.  . montelukast (SINGULAIR) 10 MG tablet Take 10 mg by mouth at bedtime.  [name]  MYRBETRIQ 50 MG TB24 tablet Take 50 mg by mouth every evening.  . pantoprazole (PROTONIX) 40 MG tablet Take 40 mg by mouth 2 (two) times daily.   [name] zolpidem (AMBIEN) 10 MG tablet Take 7.5 mg by mouth at bedtime.    No facility-administered encounter medications on file as of 03/11/2020.   Physical Exam: Blood pressure 120/76, pulse 100, temperature 98 F (36.7 C), temperature source Temporal, height 5\' 11"  (1.803 m), weight 264 lb 13.9 oz (120.1 kg), SpO2 96 %. Gen:      No acute  distress HEENT:  EOMI, sclera anicteric Neck:     No masses; no thyromegaly Lungs:    Clear to auscultation bilaterally; normal respiratory effort CV:         Regular rate and rhythm; no murmurs Abd:      + bowel sounds; soft, non-tender; no palpable masses, no distension Ext:    No edema; adequate peripheral perfusion Skin:      Warm and dry; no rash Neuro: alert and oriented x 3 Psych: normal mood and affect  Data Reviewed: Imaging: CT chest 03/28/2019-chronic appearing right-sided effusion with pleural thickening, multiple pulmonary nodules.   High-resolution CT 01/10/2020-no interstitial lung disease, changes of right lung decortication.  Subcentimeter pulmonary nodules which are stable, coronary atherosclerosis I have reviewed the images personally.  PFTs: 03/11/2020 FVC 4.07 [87%], FEV1 3.35 [96%], F/F 82, TLC 6.46 [89%], DLCO 31.60 [10 and 16%] Normal test  Labs: CBC differential 12/31/2019-WBC 15.3, eos 3.1%, absolute eosinophil count 474 IgE 12/31/2019-31 ANA, rheumatoid factor, CCP, hypersensitivity panel 12/31/2019-negative  Sleep PSG 01/17/2020-mild sleep apnea with AHI 9.4, low O2 sat of 87%  Assessment:  Evaluation for dyspnea PFTs and CT reviewed with normal test, no clear lung abnormality Suspect dyspnea secondary to body habitus and deconditioning  We will stop inhalers as there is no obstruction and no change in symptoms Advised weight loss with diet and exercise  Mild OSA Started on AutoSet CPAP He will need a follow-up visit in 1 month to review download and ensure compliance for insurance purposes.  Plan/Recommendations: Stop breztri Weight loss with diet and exercise Continue CPAP, follow-up in 1 month to review download  Marshell Garfinkel MD [facility] 03/11/2020, 1:51 PM  CC: Lavone Orn, MD

## 2020-03-11 NOTE — Addendum Note (Signed)
Addended by: Edwina Barth I on: 03/11/2020 02:33 PM   Modules accepted: Orders

## 2020-03-12 DIAGNOSIS — G4733 Obstructive sleep apnea (adult) (pediatric): Secondary | ICD-10-CM | POA: Diagnosis not present

## 2020-03-16 DIAGNOSIS — J301 Allergic rhinitis due to pollen: Secondary | ICD-10-CM | POA: Diagnosis not present

## 2020-03-18 DIAGNOSIS — D485 Neoplasm of uncertain behavior of skin: Secondary | ICD-10-CM | POA: Diagnosis not present

## 2020-03-18 DIAGNOSIS — L570 Actinic keratosis: Secondary | ICD-10-CM | POA: Diagnosis not present

## 2020-03-18 DIAGNOSIS — L820 Inflamed seborrheic keratosis: Secondary | ICD-10-CM | POA: Diagnosis not present

## 2020-03-24 DIAGNOSIS — J301 Allergic rhinitis due to pollen: Secondary | ICD-10-CM | POA: Diagnosis not present

## 2020-03-25 DIAGNOSIS — L84 Corns and callosities: Secondary | ICD-10-CM | POA: Diagnosis not present

## 2020-03-25 DIAGNOSIS — L602 Onychogryphosis: Secondary | ICD-10-CM | POA: Diagnosis not present

## 2020-03-25 DIAGNOSIS — E1351 Other specified diabetes mellitus with diabetic peripheral angiopathy without gangrene: Secondary | ICD-10-CM | POA: Diagnosis not present

## 2020-03-30 DIAGNOSIS — J301 Allergic rhinitis due to pollen: Secondary | ICD-10-CM | POA: Diagnosis not present

## 2020-04-01 ENCOUNTER — Ambulatory Visit: Payer: Medicare PPO | Admitting: Pulmonary Disease

## 2020-04-01 ENCOUNTER — Encounter: Payer: Self-pay | Admitting: Pulmonary Disease

## 2020-04-01 ENCOUNTER — Other Ambulatory Visit: Payer: Self-pay

## 2020-04-01 VITALS — BP 128/86 | HR 88 | Temp 97.8°F | Ht 71.0 in | Wt 272.0 lb

## 2020-04-01 DIAGNOSIS — R0602 Shortness of breath: Secondary | ICD-10-CM | POA: Diagnosis not present

## 2020-04-01 DIAGNOSIS — G4733 Obstructive sleep apnea (adult) (pediatric): Secondary | ICD-10-CM | POA: Diagnosis not present

## 2020-04-01 NOTE — Patient Instructions (Signed)
Glad you are doing well with regard to your breathing Continue CPAP therapy We will make sure that a follow-up CT scan has been ordered for March 2022 Follow-up in clinic after CT scan.

## 2020-04-01 NOTE — Progress Notes (Signed)
Marc Gates    [id number]    May 11, 1951  Primary Care Physician:Griffin, Jonny Ruiz, MD  Referring Physician: Kirby Funk, MD 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  Occasional nonproductive cough.   He has been taken off valsartan by primary care for a few weeks with no improvement in cough.  Has also start Pepcid and started Protonix for GERD treatment.  Started on Breo by his primary care a few weeks ago but stopped due to sore throat.  He has history of seasonal allergies, GERD, his daytime sleepiness with snoring.  He has been evaluated with sleep studies in the past which were negative. Started on Breo by his primary care but stopped due to sore throat.  Pets: Has a dog.  No cats, birds, farm animals Occupation: Retired Audiological scientist professor Exposures: No known exposures.  No mold, hot tub, Jacuzzi.  He has a down pillow and comforter which he had for many years Smoking history: 20-pack-year smoker.  Quit in 1985 Travel history: Recently lived in [place] and [place].  No significant recent travel Relevant family history: No significant family history of lung disease  Interim history: Breathing is doing well with no issues He has been started on CPAP for sleep apnea and is tolerating it well with good compliance on review of download.  Outpatient Encounter Medications as of 04/01/2020  Medication Sig  . ALPRAZolam (XANAX) 0.25 MG tablet Take 0.25 mg by mouth every 6 (six) hours as needed for anxiety.  [name] amLODipine-valsartan (EXFORGE) 5-160 MG tablet Take 1 tablet by mouth daily.  [name] aspirin EC 81 MG tablet Take 81 mg by mouth daily.  [name] atorvastatin (LIPITOR) 20 MG tablet  Take 20 mg by mouth every evening.  . benzonatate (TESSALON) 200 MG capsule Take 200 mg by mouth 3 (three) times daily as needed.   . busPIRone (BUSPAR) 10 MG tablet Take 2 tablets (20 mg total) by mouth 3 (three) times daily.  . chlorpheniramine (CHLOR-TRIMETON) 4 MG tablet Take 4 mg by mouth 3 (three) times daily as needed for allergies.  . Dulaglutide (TRULICITY) 1.5 MG/0.5ML SOPN Inject 1.5 mg into the skin every Friday.  [name] EPINEPHrine 0.3 mg/0.3 mL IJ SOAJ injection Inject 0.3 mg into the muscle as needed for anaphylaxis.   . finasteride (PROSCAR) 5 MG tablet Take 5 mg by mouth every evening.  . fluticasone (FLONASE) 50 MCG/ACT nasal spray Place 2 sprays into both nostrils every evening.   [name] glipiZIDE (GLUCOTROL) 5 MG tablet Take 5 mg by mouth 2 (two) times a day.   . levocetirizine (XYZAL) 5 MG tablet Take 5 mg by mouth every evening.  . Menthol, Topical Analgesic, (BIOFREEZE EX) Apply 1 application topically 4 (four) times daily as needed (pain.).   [name] metFORMIN (GLUCOPHAGE-XR) 500 MG 24 hr tablet Take 1,000 mg by mouth 2 (two) times daily.  . montelukast (SINGULAIR) 10 MG tablet Take 10 mg by mouth at bedtime.  [name] MYRBETRIQ 50 MG TB24 tablet Take 50 mg by mouth every evening.  . pantoprazole (PROTONIX) 40 MG tablet Take 40 mg by mouth 2 (two) times daily.   [name] zolpidem (AMBIEN) 10 MG tablet Take 7.5 mg by mouth at bedtime.  No facility-administered encounter medications on file as of 04/01/2020.   Physical Exam: Blood pressure 120/76, pulse 100, temperature 98 F (36.7 C), temperature source Temporal, height 5\' 11"  (1.803 m), weight 264 lb 13.9 oz (120.1 kg), SpO2 96 %. Gen:      No acute distress HEENT:  EOMI, sclera anicteric Neck:     No masses; no thyromegaly Lungs:    Clear to auscultation bilaterally; normal respiratory effort CV:         Regular rate and rhythm; no murmurs Abd:      + bowel sounds; soft, non-tender; no palpable masses, no distension Ext:    No edema; adequate  peripheral perfusion Skin:      Warm and dry; no rash Neuro: alert and oriented x 3 Psych: normal mood and affect  Data Reviewed: Imaging: CT chest 03/28/2019-chronic appearing right-sided effusion with pleural thickening, multiple pulmonary nodules.   High-resolution CT 01/10/2020-no interstitial lung disease, changes of right lung decortication.  Subcentimeter pulmonary nodules which are stable, coronary atherosclerosis I have reviewed the images personally.  PFTs: 03/11/2020 FVC 4.07 [87%], FEV1 3.35 [96%], F/F 82, TLC 6.46 [89%], DLCO 31.60 [10 and 16%] Normal test  Labs: CBC differential 12/31/2019-WBC 15.3, eos 3.1%, absolute eosinophil count 474 IgE 12/31/2019-31 ANA, rheumatoid factor, CCP, hypersensitivity panel 12/31/2019-negative  Sleep PSG 01/17/2020-mild sleep apnea with AHI 9.4, low O2 sat of 87%  CPAP download 03/30/2020-97% compliant, residual AHI 0.41  Assessment:  Evaluation for dyspnea PFTs and CT reviewed with normal test, no clear lung abnormality Suspect dyspnea secondary to body habitus and deconditioning  Off all inhalers Advised weight loss with diet and exercise  Mild OSA Started on AutoSet CPAP Download reviewed with good compliance.  Plan/Recommendations: Weight loss with diet and exercise Continue CPAP Follow-up CT in 1 year.  Marshell Garfinkel MD [facility] 04/01/2020, 2:33 PM  CC: Lavone Orn, MD

## 2020-04-03 ENCOUNTER — Encounter: Payer: Self-pay | Admitting: Pulmonary Disease

## 2020-04-03 DIAGNOSIS — E1169 Type 2 diabetes mellitus with other specified complication: Secondary | ICD-10-CM | POA: Diagnosis not present

## 2020-04-03 DIAGNOSIS — I2510 Atherosclerotic heart disease of native coronary artery without angina pectoris: Secondary | ICD-10-CM | POA: Diagnosis not present

## 2020-04-03 DIAGNOSIS — K219 Gastro-esophageal reflux disease without esophagitis: Secondary | ICD-10-CM | POA: Diagnosis not present

## 2020-04-03 DIAGNOSIS — R05 Cough: Secondary | ICD-10-CM | POA: Diagnosis not present

## 2020-04-03 DIAGNOSIS — I10 Essential (primary) hypertension: Secondary | ICD-10-CM | POA: Diagnosis not present

## 2020-04-03 DIAGNOSIS — Z7984 Long term (current) use of oral hypoglycemic drugs: Secondary | ICD-10-CM | POA: Diagnosis not present

## 2020-04-03 DIAGNOSIS — I700 Atherosclerosis of aorta: Secondary | ICD-10-CM | POA: Diagnosis not present

## 2020-04-07 DIAGNOSIS — Z7984 Long term (current) use of oral hypoglycemic drugs: Secondary | ICD-10-CM | POA: Diagnosis not present

## 2020-04-07 DIAGNOSIS — J301 Allergic rhinitis due to pollen: Secondary | ICD-10-CM | POA: Diagnosis not present

## 2020-04-07 DIAGNOSIS — E1169 Type 2 diabetes mellitus with other specified complication: Secondary | ICD-10-CM | POA: Diagnosis not present

## 2020-04-08 DIAGNOSIS — L570 Actinic keratosis: Secondary | ICD-10-CM | POA: Diagnosis not present

## 2020-04-08 DIAGNOSIS — L820 Inflamed seborrheic keratosis: Secondary | ICD-10-CM | POA: Diagnosis not present

## 2020-04-08 DIAGNOSIS — D485 Neoplasm of uncertain behavior of skin: Secondary | ICD-10-CM | POA: Diagnosis not present

## 2020-04-12 DIAGNOSIS — G4733 Obstructive sleep apnea (adult) (pediatric): Secondary | ICD-10-CM | POA: Diagnosis not present

## 2020-04-13 DIAGNOSIS — J301 Allergic rhinitis due to pollen: Secondary | ICD-10-CM | POA: Diagnosis not present

## 2020-04-20 DIAGNOSIS — J301 Allergic rhinitis due to pollen: Secondary | ICD-10-CM | POA: Diagnosis not present

## 2020-04-27 DIAGNOSIS — J301 Allergic rhinitis due to pollen: Secondary | ICD-10-CM | POA: Diagnosis not present

## 2020-05-01 DIAGNOSIS — M25471 Effusion, right ankle: Secondary | ICD-10-CM | POA: Diagnosis not present

## 2020-05-04 DIAGNOSIS — J301 Allergic rhinitis due to pollen: Secondary | ICD-10-CM | POA: Diagnosis not present

## 2020-05-06 DIAGNOSIS — I10 Essential (primary) hypertension: Secondary | ICD-10-CM | POA: Diagnosis not present

## 2020-05-06 DIAGNOSIS — I2510 Atherosclerotic heart disease of native coronary artery without angina pectoris: Secondary | ICD-10-CM | POA: Diagnosis not present

## 2020-05-06 DIAGNOSIS — E1169 Type 2 diabetes mellitus with other specified complication: Secondary | ICD-10-CM | POA: Diagnosis not present

## 2020-05-06 DIAGNOSIS — E785 Hyperlipidemia, unspecified: Secondary | ICD-10-CM | POA: Diagnosis not present

## 2020-05-06 DIAGNOSIS — N400 Enlarged prostate without lower urinary tract symptoms: Secondary | ICD-10-CM | POA: Diagnosis not present

## 2020-05-06 DIAGNOSIS — E781 Pure hyperglyceridemia: Secondary | ICD-10-CM | POA: Diagnosis not present

## 2020-05-12 DIAGNOSIS — J3089 Other allergic rhinitis: Secondary | ICD-10-CM | POA: Diagnosis not present

## 2020-05-12 DIAGNOSIS — G4733 Obstructive sleep apnea (adult) (pediatric): Secondary | ICD-10-CM | POA: Diagnosis not present

## 2020-05-18 DIAGNOSIS — J3089 Other allergic rhinitis: Secondary | ICD-10-CM | POA: Diagnosis not present

## 2020-05-18 DIAGNOSIS — J301 Allergic rhinitis due to pollen: Secondary | ICD-10-CM | POA: Diagnosis not present

## 2020-05-25 DIAGNOSIS — J301 Allergic rhinitis due to pollen: Secondary | ICD-10-CM | POA: Diagnosis not present

## 2020-05-25 DIAGNOSIS — J3089 Other allergic rhinitis: Secondary | ICD-10-CM | POA: Diagnosis not present

## 2020-05-27 ENCOUNTER — Encounter (INDEPENDENT_AMBULATORY_CARE_PROVIDER_SITE_OTHER): Payer: Self-pay | Admitting: Bariatrics

## 2020-05-27 ENCOUNTER — Ambulatory Visit (INDEPENDENT_AMBULATORY_CARE_PROVIDER_SITE_OTHER): Payer: Medicare PPO | Admitting: Bariatrics

## 2020-05-27 ENCOUNTER — Other Ambulatory Visit: Payer: Self-pay

## 2020-05-27 VITALS — BP 133/83 | HR 60 | Temp 97.8°F | Ht 70.0 in | Wt 263.0 lb

## 2020-05-27 DIAGNOSIS — E1169 Type 2 diabetes mellitus with other specified complication: Secondary | ICD-10-CM

## 2020-05-27 DIAGNOSIS — E559 Vitamin D deficiency, unspecified: Secondary | ICD-10-CM

## 2020-05-27 DIAGNOSIS — Z6837 Body mass index (BMI) 37.0-37.9, adult: Secondary | ICD-10-CM

## 2020-05-27 DIAGNOSIS — R0602 Shortness of breath: Secondary | ICD-10-CM

## 2020-05-27 DIAGNOSIS — G4733 Obstructive sleep apnea (adult) (pediatric): Secondary | ICD-10-CM | POA: Diagnosis not present

## 2020-05-27 DIAGNOSIS — Z0289 Encounter for other administrative examinations: Secondary | ICD-10-CM

## 2020-05-27 DIAGNOSIS — E785 Hyperlipidemia, unspecified: Secondary | ICD-10-CM

## 2020-05-27 DIAGNOSIS — E669 Obesity, unspecified: Secondary | ICD-10-CM

## 2020-05-27 DIAGNOSIS — I152 Hypertension secondary to endocrine disorders: Secondary | ICD-10-CM

## 2020-05-27 DIAGNOSIS — E1159 Type 2 diabetes mellitus with other circulatory complications: Secondary | ICD-10-CM

## 2020-05-27 DIAGNOSIS — R5383 Other fatigue: Secondary | ICD-10-CM

## 2020-05-27 DIAGNOSIS — Z1331 Encounter for screening for depression: Secondary | ICD-10-CM

## 2020-05-27 DIAGNOSIS — Z9989 Dependence on other enabling machines and devices: Secondary | ICD-10-CM

## 2020-05-27 DIAGNOSIS — I10 Essential (primary) hypertension: Secondary | ICD-10-CM

## 2020-05-27 MED ORDER — GLIPIZIDE 5 MG PO TABS: 5.0000 mg | ORAL_TABLET | Freq: Every day | ORAL | 0 refills | Status: DC

## 2020-05-27 NOTE — Progress Notes (Signed)
Dear Dr. Kirby FunkJohn Griffin,   Thank you for referring Jonetta SpeakGlenn L Ledyard to our clinic. The following note includes my evaluation and treatment recommendations.  Chief Complaint:   OBESITY Jonetta SpeakGlenn L Ding (MR# [id number]) is a 69 y.o. male who presents for evaluation and treatment of obesity and related comorbidities. Current BMI is Body mass index is 37.74 kg/m.[name]. Sherrine MaplesGlenn has been struggling with his weight for many years and has been unsuccessful in either losing weight, maintaining weight loss, or reaching his healthy weight goal.  Sherrine MaplesGlenn is currently in the action stage of change and ready to dedicate time achieving and maintaining a healthier weight. Sherrine MaplesGlenn is interested in becoming our patient and working on intensive lifestyle modifications including (but not limited to) diet and exercise for weight loss.  Sherrine MaplesGlenn does like to cook. He craves carbohydrates and hamburgers. He snacks at night.  Chinmay's habits were reviewed today and are as follows: his desired weight loss is 53-63 lbs, he started gaining weight in 2000, his heaviest weight ever was 273 pounds, he craves ice cream, hamburgers, and pasta, he snacks frequently in the evenings, he frequently makes poor food choices, he frequently eats larger portions than normal and he has binge eating behaviors.  Depression Screen Francois's Food and Mood (modified PHQ-9) score was 13.  Depression screen PHQ 2/9 05/27/2020  Decreased Interest 2  Down, Depressed, Hopeless 1  PHQ - 2 Score 3  Altered sleeping 2  Tired, decreased energy 3  Change in appetite 1  Feeling bad or failure about yourself  1  Trouble concentrating 1  Moving slowly or fidgety/restless 2  Suicidal thoughts 0  PHQ-9 Score 13  Difficult doing work/chores Not difficult at all   Subjective:   Other fatigue. Sherrine MaplesGlenn admits to daytime somnolence and admits to waking up still tired. Patent has a history of symptoms of daytime fatigue and Epworth sleepiness scale. Sherrine MaplesGlenn generally gets  8-9 hours of sleep per night, and states that he has generally restful sleep - takes Ambien. Snoring is not present. Apneic episodes are not present. Epworth Sleepiness Score is 10.  SOB (shortness of breath) on exertion. Sherrine MaplesGlenn notes increasing shortness of breath with certain activities and seems to be worsening over time with weight gain. He notes getting out of breath sooner with activity than he used to. This has gotten worse recently. Sherrine MaplesGlenn denies shortness of breath at rest or orthopnea.  Type 2 diabetes mellitus with obesity (HCC). Sherrine MaplesGlenn is taking Trulicity, glipizide, and metformin (recently decreased glipizide). Fasting blood sugars range between 120 and 170 with 2-hour postprandials low.  Lab Results  Component Value Date   HGBA1C 7.5 (H) 04/03/2019   Lab Results  Component Value Date   CREATININE 0.86 04/05/2019   No results found for: INSULIN  Hypertension associated with diabetes (HCC). Sherrine MaplesGlenn is taking Exforge. Blood pressure is stable.  BP Readings from Last 3 Encounters:  05/27/20 133/83  04/01/20 128/86  03/11/20 138/64   Lab Results  Component Value Date   CREATININE 0.86 04/05/2019   CREATININE 0.83 04/04/2019   CREATININE 0.78 04/03/2019   Hyperlipidemia associated with type 2 diabetes mellitus (HCC). Sherrine MaplesGlenn is taking Lipitor.  No results found for: CHOL, HDL, LDLCALC, LDLDIRECT, TRIG, CHOLHDL Lab Results  Component Value Date   ALT 17 04/03/2019   AST 16 04/03/2019   ALKPHOS 138 (H) 04/03/2019   BILITOT 0.5 04/03/2019   The ASCVD Risk score Denman George(Goff DC Jr., et al., 2013) failed to calculate for the following  reasons:   Cannot find a previous HDL lab   Cannot find a previous total cholesterol lab  OSA (obstructive sleep apnea). Samar uses CPAP.  Vitamin D deficiency. Olufemi is taking OTC Vitamin D supplementation.   Depression screening. Abhimanyu had a moderately positive depression screen with a PHQ-9 score of 13.  Assessment/Plan:   Other fatigue. Jaqua  does feel that his weight is causing his energy to be lower than it should be. Fatigue may be related to obesity, depression or many other causes. Labs will be ordered, and in the meanwhile, Kalon will focus on self care including making healthy food choices, increasing physical activity and focusing on stress reduction. EKG 12-Lead, T3, T4, free, TSH testing ordered today.  SOB (shortness of breath) on exertion. Jayshaun does feel that he gets out of breath more easily that he used to when he exercises. Shaun's shortness of breath appears to be obesity related and exercise induced. He has agreed to work on weight loss and gradually increase exercise to treat his exercise induced shortness of breath. Will continue to monitor closely. Lipid Panel With LDL/HDL Ratio will be checked today.  Type 2 diabetes mellitus with obesity (HCC). Good blood sugar control is important to decrease the likelihood of diabetic complications such as nephropathy, neuropathy, limb loss, blindness, coronary artery disease, and death. Intensive lifestyle modification including diet, exercise and weight loss are the first line of treatment for diabetes. Dannel will continue to check fasting blood sugars and 2-hour postprandials.  Hypertension associated with diabetes (HCC). Calton is working on healthy weight loss and exercise to improve blood pressure control. We will watch for signs of hypotension as he continues his lifestyle modifications. He will continue his medication as directed.   Hyperlipidemia associated with type 2 diabetes mellitus (HCC). Cardiovascular risk and specific lipid/LDL goals reviewed.  We discussed several lifestyle modifications today and Rawlin will continue to work on diet, exercise and weight loss efforts. Orders and follow up as documented in patient record. Jac will continue his medication as directed.   Counseling Intensive lifestyle modifications are the first line treatment for this issue. . Dietary  changes: Increase soluble fiber. Decrease simple carbohydrates. . Exercise changes: Moderate to vigorous-intensity aerobic activity 150 minutes per week if tolerated. . Lipid-lowering medications: see documented in medical record.  OSA (obstructive sleep apnea). Intensive lifestyle modifications are the first line treatment for this issue. We discussed several lifestyle modifications today and he will continue to work on diet, exercise and weight loss efforts. We will continue to monitor. Orders and follow up as documented in patient record. Aryaan will continue wearing CPAP nightly as directed.  Counseling  Sleep apnea is a condition in which breathing pauses or becomes shallow during sleep. This happens over and over during the night. This disrupts your sleep and keeps your body from getting the rest that it needs, which can cause tiredness and lack of energy (fatigue) during the day.  Sleep apnea treatment: If you were given a device to open your airway while you sleep, USE IT!  Sleep hygiene:   Limit or avoid alcohol, caffeinated beverages, and cigarettes, especially close to bedtime.   Do not eat a large meal or eat spicy foods right before bedtime. This can lead to digestive discomfort that can make it hard for you to sleep.  Keep a sleep diary to help you and your health care provider figure out what could be causing your insomnia.  . Make your bedroom a dark, comfortable  place where it is easy to fall asleep. ? Put up shades or blackout curtains to block light from outside. ? Use a white noise machine to block noise. ? Keep the temperature cool. . Limit screen use before bedtime. This includes: ? Watching TV. ? Using your smartphone, tablet, or computer. . Stick to a routine that includes going to bed and waking up at the same times every day and night. This can help you fall asleep faster. Consider making a quiet activity, such as reading, part of your nighttime routine. . Try to  avoid taking naps during the day so that you sleep better at night. . Get out of bed if you are still awake after 15 minutes of trying to sleep. Keep the lights down, but try reading or doing a quiet activity. When you feel sleepy, go back to bed.  Vitamin D deficiency. Low Vitamin D level contributes to fatigue and are associated with obesity, breast, and colon cancer. VITAMIN D 25 Hydroxy (Vit-D Deficiency, Fractures) level will be checked today.  Depression screening. Mahlik had a positive depression screening. Depression is commonly associated with obesity and often results in emotional eating behaviors. We will monitor this closely and work on CBT to help improve the non-hunger eating patterns. Referral to Psychology may be required if no improvement is seen as he continues in our clinic.  Class 2 severe obesity with serious comorbidity and body mass index (BMI) of 37.0 to 37.9 in adult, unspecified obesity type (HCC).  Taimur is currently in the action stage of change and his goal is to continue with weight loss efforts. I recommend Xerxes begin the structured treatment plan as follows:  He has agreed to the Category 3 Plan and will journal 1500 calories and 90 grams of protein.  He will work on meal planning and intentional eating.   We independently reviewed with the patient labs from 12/31/2019 including CBC and labs from 04/05/2019 including CMP, CBC, and glucose.  Exercise goals: Lycan will continue walking 10,000 steps for exercise.  Behavioral modification strategies: increasing lean protein intake, decreasing simple carbohydrates, increasing vegetables, increasing water intake, decreasing eating out, no skipping meals, meal planning and cooking strategies, keeping healthy foods in the home and planning for success.  He was informed of the importance of frequent follow-up visits to maximize his success with intensive lifestyle modifications for his multiple health conditions. He was  informed we would discuss his lab results at his next visit unless there is a critical issue that needs to be addressed sooner. Trayson agreed to keep his next visit at the agreed upon time to discuss these results.  Objective:   Blood pressure 133/83, pulse 60, temperature 97.8 F (36.6 C), height 5\' 10"  (1.778 m), weight 263 lb (119.3 kg), SpO2 95 %. Body mass index is 37.74 kg/m.  EKG: Sinus  Rhythm with a rate of 62 BPM. Incomplete right bundle branch block. Otherwise normal.  Indirect Calorimeter completed today shows a VO2 of 309 and a REE of 2151.  His calculated basal metabolic rate is 2152 thus his basal metabolic rate is worse than expected.  General: Cooperative, alert, well developed, in no acute distress. HEENT: Conjunctivae and lids unremarkable. Cardiovascular: Regular rhythm.  Lungs: Normal work of breathing. Neurologic: No focal deficits.   Lab Results  Component Value Date   CREATININE 0.86 04/05/2019   BUN 18 04/05/2019   NA 135 04/05/2019   K 4.3 04/05/2019   CL 101 04/05/2019   CO2 23  04/05/2019   Lab Results  Component Value Date   ALT 17 04/03/2019   AST 16 04/03/2019   ALKPHOS 138 (H) 04/03/2019   BILITOT 0.5 04/03/2019   Lab Results  Component Value Date   HGBA1C 7.5 (H) 04/03/2019   No results found for: INSULIN No results found for: TSH No results found for: CHOL, HDL, LDLCALC, LDLDIRECT, TRIG, CHOLHDL Lab Results  Component Value Date   WBC 15.3 (H) 12/31/2019   HGB 15.7 12/31/2019   HCT 47.4 12/31/2019   MCV 92.9 12/31/2019   PLT 256.0 12/31/2019   No results found for: IRON, TIBC, FERRITIN  Obesity Behavioral Intervention Visit Documentation for Insurance:   Approximately 15 minutes were spent on the discussion below.  ASK: We discussed the diagnosis of obesity with [name] today and Davon agreed to give [name] permission to discuss obesity behavioral modification therapy today.  ASSESS: Terrace has the diagnosis of obesity and his BMI  today is 37.8. Milind is in the action stage of change.   ADVISE: Avory was educated on the multiple health risks of obesity as well as the benefit of weight loss to improve his health. He was advised of the need for long term treatment and the importance of lifestyle modifications to improve his current health and to decrease his risk of future health problems.  AGREE: Multiple dietary modification options and treatment options were discussed and Shaul agreed to follow the recommendations documented in the above note.  ARRANGE: Halim was educated on the importance of frequent visits to treat obesity as outlined per CMS and USPSTF guidelines and agreed to schedule his next follow up appointment today.  Attestation Statements:   Reviewed by clinician on day of visit: allergies, medications, problem list, medical history, surgical history, family history, social history, and previous encounter notes.  [name], am acting as Energy manager for [employer], DO   I have reviewed the above documentation for accuracy and completeness, and I agree with the above. [name], DO

## 2020-05-28 ENCOUNTER — Encounter (INDEPENDENT_AMBULATORY_CARE_PROVIDER_SITE_OTHER): Payer: Self-pay | Admitting: Bariatrics

## 2020-05-28 DIAGNOSIS — E1159 Type 2 diabetes mellitus with other circulatory complications: Secondary | ICD-10-CM | POA: Insufficient documentation

## 2020-05-28 DIAGNOSIS — E785 Hyperlipidemia, unspecified: Secondary | ICD-10-CM | POA: Insufficient documentation

## 2020-05-28 DIAGNOSIS — E6609 Other obesity due to excess calories: Secondary | ICD-10-CM | POA: Insufficient documentation

## 2020-05-28 DIAGNOSIS — I152 Hypertension secondary to endocrine disorders: Secondary | ICD-10-CM | POA: Insufficient documentation

## 2020-05-28 DIAGNOSIS — E1169 Type 2 diabetes mellitus with other specified complication: Secondary | ICD-10-CM | POA: Insufficient documentation

## 2020-05-28 DIAGNOSIS — G4733 Obstructive sleep apnea (adult) (pediatric): Secondary | ICD-10-CM | POA: Insufficient documentation

## 2020-05-28 LAB — LIPID PANEL WITH LDL/HDL RATIO
Cholesterol, Total: 102 mg/dL (ref 100–199)
HDL: 47 mg/dL (ref >39)
LDL Chol Calc (NIH): 30 mg/dL (ref 0–99)
LDL/HDL Ratio: 0.6 ratio (ref 0.0–3.6)
Triglycerides: 146 mg/dL (ref 0–149)
VLDL Cholesterol Cal: 25 mg/dL (ref 5–40)

## 2020-05-28 LAB — T3: T3, Total: 103 ng/dL (ref 71–180)

## 2020-05-28 LAB — VITAMIN D 25 HYDROXY (VIT D DEFICIENCY, FRACTURES): Vit D, 25-Hydroxy: 40 ng/mL (ref 30.0–100.0)

## 2020-05-28 LAB — TSH: TSH: 1.26 u[IU]/mL (ref 0.450–4.500)

## 2020-05-28 LAB — T4, FREE: Free T4: 1.24 ng/dL (ref 0.82–1.77)

## 2020-06-01 DIAGNOSIS — J301 Allergic rhinitis due to pollen: Secondary | ICD-10-CM | POA: Diagnosis not present

## 2020-06-08 DIAGNOSIS — J301 Allergic rhinitis due to pollen: Secondary | ICD-10-CM | POA: Diagnosis not present

## 2020-06-10 ENCOUNTER — Ambulatory Visit (INDEPENDENT_AMBULATORY_CARE_PROVIDER_SITE_OTHER): Payer: Medicare PPO | Admitting: Bariatrics

## 2020-06-10 ENCOUNTER — Other Ambulatory Visit (INDEPENDENT_AMBULATORY_CARE_PROVIDER_SITE_OTHER): Payer: Self-pay | Admitting: Bariatrics

## 2020-06-10 ENCOUNTER — Encounter (INDEPENDENT_AMBULATORY_CARE_PROVIDER_SITE_OTHER): Payer: Self-pay | Admitting: Bariatrics

## 2020-06-10 ENCOUNTER — Other Ambulatory Visit: Payer: Self-pay

## 2020-06-10 VITALS — BP 134/78 | HR 94 | Temp 97.8°F | Ht 70.0 in | Wt 261.0 lb

## 2020-06-10 DIAGNOSIS — E559 Vitamin D deficiency, unspecified: Secondary | ICD-10-CM

## 2020-06-10 DIAGNOSIS — Z6837 Body mass index (BMI) 37.0-37.9, adult: Secondary | ICD-10-CM | POA: Diagnosis not present

## 2020-06-10 DIAGNOSIS — E1159 Type 2 diabetes mellitus with other circulatory complications: Secondary | ICD-10-CM

## 2020-06-10 DIAGNOSIS — I10 Essential (primary) hypertension: Secondary | ICD-10-CM

## 2020-06-10 DIAGNOSIS — E1169 Type 2 diabetes mellitus with other specified complication: Secondary | ICD-10-CM

## 2020-06-10 DIAGNOSIS — E669 Obesity, unspecified: Secondary | ICD-10-CM | POA: Diagnosis not present

## 2020-06-10 DIAGNOSIS — I152 Hypertension secondary to endocrine disorders: Secondary | ICD-10-CM

## 2020-06-10 MED ORDER — VITAMIN D (ERGOCALCIFEROL) 1.25 MG (50000 UNIT) PO CAPS: 50000.0000 [IU] | ORAL_CAPSULE | ORAL | 0 refills | Status: DC

## 2020-06-11 ENCOUNTER — Encounter (INDEPENDENT_AMBULATORY_CARE_PROVIDER_SITE_OTHER): Payer: Self-pay | Admitting: Bariatrics

## 2020-06-11 NOTE — Progress Notes (Signed)
Chief Complaint:   OBESITY Marc Gates is here to discuss his progress with his obesity treatment plan along with follow-up of his obesity related diagnoses. [name] is on the Category 3 Plan and states he is following his eating plan approximately 98% of the time. Marc Gates states he is walking 5,000-10,000 steps 7 times per week.  Today's visit was #: 2 Starting weight: 263 lbs Starting date: 05/27/2020 Today's weight: 261 lbs Today's date: 06/10/2020 Total lbs lost to date: 2 Total lbs lost since last in-office visit: 2  Interim History: Marc Gates is down 2 lbs.  Subjective:   Hypertension associated with diabetes (HCC). Marc Gates is taking Exforge. Blood pressure is controlled at 134/78.  BP Readings from Last 3 Encounters:  06/10/20 134/78  05/27/20 133/83  04/01/20 128/86   Lab Results  Component Value Date   CREATININE 0.86 04/05/2019   CREATININE 0.83 04/04/2019   CREATININE 0.78 04/03/2019   Type 2 diabetes mellitus with obesity (HCC). [name] is taking Glipizide, Trulicity, and metformin. He reports no lows. He states average blood sugars have decreased by 10-15 points.   Lab Results  Component Value Date   HGBA1C 7.5 (H) 04/03/2019   Lab Results  Component Value Date   LDLCALC 30 05/27/2020   CREATININE 0.86 04/05/2019   No results found for: INSULIN  Vitamin D deficiency.  No nausea, vomiting, or muscle weakness.    Ref. Range 05/27/2020 13:26  Vitamin D, 25-Hydroxy Latest Ref Range: 30.0 - 100.0 ng/mL 40.0   Assessment/Plan:   Hypertension associated with diabetes (HCC). [name] is working on healthy weight loss and exercise to improve blood pressure control. We will watch for signs of hypotension as he continues his lifestyle modifications. He will continue his medication as directed.   Type 2 diabetes mellitus with obesity (HCC). Good blood sugar control is important to decrease the likelihood of diabetic complications such as nephropathy, neuropathy, limb  loss, blindness, coronary artery disease, and death. Intensive lifestyle modification including diet, exercise and weight loss are the first line of treatment for diabetes. Marc Gates will continue his medications as directed. In the future he will decrease Glipizide or stop if he has any lows.  Vitamin D deficiency. Low Vitamin D level contributes to fatigue and are associated with obesity, breast, and colon cancer. He was given a prescription for Vitamin D, Ergocalciferol, (DRISDOL) 1.25 MG (50000 UNIT) CAPS capsule every week #4 with 0 refills and will follow-up for routine testing of Vitamin D, at least 2-3 times per year to avoid over-replacement.   Class 2 severe obesity due to excess calories with serious comorbidity and body mass index (BMI) of 37.0 to 37.9 in adult [facility]).  [name] is currently in the action stage of change. As such, his goal is to continue with weight loss efforts. He has agreed to keeping a food journal and adhering to recommended goals of 1500 calories and 90-100 grams of protein.   He will work on meal planning.  We reviewed with the patient labs from 05/27/2020 including lipids, Vitamin D, and thyroid panel.  Handout was provided on Protein Equivalents.  Exercise goals: Marc Gates will increase his steps to 11,000 daily.  Behavioral modification strategies: increasing lean protein intake, decreasing simple carbohydrates, increasing vegetables, increasing water intake, decreasing eating out, no skipping meals, meal planning and cooking strategies, keeping healthy foods in the home and planning for success.  Marc Gates has agreed to follow-up with our clinic in 2 weeks. He was informed of the  importance of frequent follow-up visits to maximize his success with intensive lifestyle modifications for his multiple health conditions.   Objective:   Blood pressure 134/78, pulse 94, temperature 97.8 F (36.6 C), height 5\' 10"  (1.778 m), weight 261 lb (118.4 kg), SpO2 97 %. Body mass index  is 37.45 kg/m.  General: Cooperative, alert, well developed, in no acute distress. HEENT: Conjunctivae and lids unremarkable. Cardiovascular: Regular rhythm.  Lungs: Normal work of breathing. Neurologic: No focal deficits.   Lab Results  Component Value Date   CREATININE 0.86 04/05/2019   BUN 18 04/05/2019   NA 135 04/05/2019   K 4.3 04/05/2019   CL 101 04/05/2019   CO2 23 04/05/2019   Lab Results  Component Value Date   ALT 17 04/03/2019   AST 16 04/03/2019   ALKPHOS 138 (H) 04/03/2019   BILITOT 0.5 04/03/2019   Lab Results  Component Value Date   HGBA1C 7.5 (H) 04/03/2019   No results found for: INSULIN Lab Results  Component Value Date   TSH 1.260 05/27/2020   Lab Results  Component Value Date   CHOL 102 05/27/2020   HDL 47 05/27/2020   LDLCALC 30 05/27/2020   TRIG 146 05/27/2020   Lab Results  Component Value Date   WBC 15.3 (H) 12/31/2019   HGB 15.7 12/31/2019   HCT 47.4 12/31/2019   MCV 92.9 12/31/2019   PLT 256.0 12/31/2019   No results found for: IRON, TIBC, FERRITIN  Obesity Behavioral Intervention Documentation for Insurance:   Approximately 15 minutes were spent on the discussion below.  ASK: We discussed the diagnosis of obesity with 01/02/2020 today and Basheer agreed to give Marc Gates permission to discuss obesity behavioral modification therapy today.  ASSESS: Marc Gates has the diagnosis of obesity and his BMI today is 37.5. Marc Gates is in the action stage of change.   ADVISE: Marc Gates was educated on the multiple health risks of obesity as well as the benefit of weight loss to improve his health. He was advised of the need for long term treatment and the importance of lifestyle modifications to improve his current health and to decrease his risk of future health problems.  AGREE: Multiple dietary modification options and treatment options were discussed and Marc Gates agreed to follow the recommendations documented in the above note.  ARRANGE: Marc Gates was educated  on the importance of frequent visits to treat obesity as outlined per CMS and USPSTF guidelines and agreed to schedule his next follow up appointment today.  Attestation Statements:   Reviewed by clinician on day of visit: allergies, medications, problem list, medical history, surgical history, family history, social history, and previous encounter notes.  [facility], am acting as [name] for Energy manager, DO   I have reviewed the above documentation for accuracy and completeness, and I agree with the above. [employer], DO

## 2020-06-12 DIAGNOSIS — G4733 Obstructive sleep apnea (adult) (pediatric): Secondary | ICD-10-CM | POA: Diagnosis not present

## 2020-06-15 DIAGNOSIS — J301 Allergic rhinitis due to pollen: Secondary | ICD-10-CM | POA: Diagnosis not present

## 2020-06-22 DIAGNOSIS — J301 Allergic rhinitis due to pollen: Secondary | ICD-10-CM | POA: Diagnosis not present

## 2020-06-23 DIAGNOSIS — E1351 Other specified diabetes mellitus with diabetic peripheral angiopathy without gangrene: Secondary | ICD-10-CM | POA: Diagnosis not present

## 2020-06-23 DIAGNOSIS — L602 Onychogryphosis: Secondary | ICD-10-CM | POA: Diagnosis not present

## 2020-06-23 DIAGNOSIS — L84 Corns and callosities: Secondary | ICD-10-CM | POA: Diagnosis not present

## 2020-06-29 ENCOUNTER — Encounter (INDEPENDENT_AMBULATORY_CARE_PROVIDER_SITE_OTHER): Payer: Self-pay | Admitting: Bariatrics

## 2020-06-29 ENCOUNTER — Ambulatory Visit (INDEPENDENT_AMBULATORY_CARE_PROVIDER_SITE_OTHER): Payer: Medicare PPO | Admitting: Bariatrics

## 2020-06-29 ENCOUNTER — Other Ambulatory Visit: Payer: Self-pay

## 2020-06-29 VITALS — BP 118/77 | HR 82 | Temp 97.9°F | Ht 70.0 in | Wt 254.0 lb

## 2020-06-29 DIAGNOSIS — Z6836 Body mass index (BMI) 36.0-36.9, adult: Secondary | ICD-10-CM | POA: Diagnosis not present

## 2020-06-29 DIAGNOSIS — E1159 Type 2 diabetes mellitus with other circulatory complications: Secondary | ICD-10-CM

## 2020-06-29 DIAGNOSIS — E669 Obesity, unspecified: Secondary | ICD-10-CM

## 2020-06-29 DIAGNOSIS — I10 Essential (primary) hypertension: Secondary | ICD-10-CM | POA: Diagnosis not present

## 2020-06-29 DIAGNOSIS — I152 Hypertension secondary to endocrine disorders: Secondary | ICD-10-CM

## 2020-06-29 DIAGNOSIS — E1169 Type 2 diabetes mellitus with other specified complication: Secondary | ICD-10-CM

## 2020-06-29 DIAGNOSIS — E559 Vitamin D deficiency, unspecified: Secondary | ICD-10-CM

## 2020-06-29 DIAGNOSIS — J301 Allergic rhinitis due to pollen: Secondary | ICD-10-CM | POA: Diagnosis not present

## 2020-06-29 MED ORDER — VITAMIN D (ERGOCALCIFEROL) 1.25 MG (50000 UNIT) PO CAPS: 50000.0000 [IU] | ORAL_CAPSULE | ORAL | 0 refills | Status: DC

## 2020-06-30 ENCOUNTER — Encounter (INDEPENDENT_AMBULATORY_CARE_PROVIDER_SITE_OTHER): Payer: Self-pay | Admitting: Bariatrics

## 2020-06-30 NOTE — Progress Notes (Signed)
Chief Complaint:   OBESITY Marc Gates is here to discuss his progress with his obesity treatment plan along with follow-up of his obesity related diagnoses. Marc Gates is keeping a food journal and adhering to recommended goals of 1700-1800 calories and 90-100 grams of protein and states he is following his eating plan approximately 75% of the time. Marc Gates states he is walking 5,000 to 10,000 steps 5-7 times per week.  Today's visit was #: 3 Starting weight: 263 lbs Starting date: 05/27/2020 Today's weight: 254 lbs Today's date: 06/29/2020 Total lbs lost to date: 9 Total lbs lost since last in-office visit: 7  Interim History: Marc Gates is down 7 lbs since his last visit.  Subjective:   Vitamin D deficiency. No nausea, vomiting, or muscle weakness.    Ref. Range 05/27/2020 13:26  Vitamin D, 25-Hydroxy Latest Ref Range: 30.0 - 100.0 ng/mL 40.0   Hypertension associated with diabetes (HCC). Kaliel is taking Exforge. Blood pressure is controlled.  BP Readings from Last 3 Encounters:  06/29/20 118/77  06/10/20 134/78  05/27/20 133/83   Lab Results  Component Value Date   CREATININE 0.86 04/05/2019   CREATININE 0.83 04/04/2019   CREATININE 0.78 04/03/2019   Type 2 diabetes mellitus with obesity (HCC). Fasting blood sugars are in the range of 130 and 135 with lowest readings in the range of 70 and 170.  Lab Results  Component Value Date   HGBA1C 7.5 (H) 04/03/2019   Lab Results  Component Value Date   LDLCALC 30 05/27/2020   CREATININE 0.86 04/05/2019   No results found for: INSULIN  Assessment/Plan:   Vitamin D deficiency. Low Vitamin D level contributes to fatigue and are associated with obesity, breast, and colon cancer. He was given a prescription for Vitamin D, Ergocalciferol, (DRISDOL) 1.25 MG (50000 UNIT) CAPS capsule every week #4 with 0 refills and will follow-up for routine testing of Vitamin D, at least 2-3 times per year to avoid over-replacement.    Hypertension associated with diabetes (HCC). Marc Gates is working on healthy weight loss and exercise to improve blood pressure control. We will watch for signs of hypotension as he continues his lifestyle modifications. He will continue his medication as directed.   Type 2 diabetes mellitus with obesity (HCC). Good blood sugar control is important to decrease the likelihood of diabetic complications such as nephropathy, neuropathy, limb loss, blindness, coronary artery disease, and death. Intensive lifestyle modification including diet, exercise and weight loss are the first line of treatment for diabetes. Will check A1c at his next visit.  Class 2 severe obesity with serious comorbidity and body mass index (BMI) of 36.0 to 36.9 in adult, unspecified obesity type (HCC).  [name] is currently in the action stage of change. As such, his goal is to continue with weight loss efforts. He has agreed to keeping a food journal and adhering to recommended goals of 1700-1800 calories and 90-100 grams of protein.   He will work on meal planning and intentional eating.   Handout was provided on [employer].  Exercise goals: Marc Gates will continue walking 5,000 to 10,000 steps 5-7 times per week.  Behavioral modification strategies: increasing lean protein intake, decreasing simple carbohydrates, increasing vegetables, increasing water intake, decreasing eating out, no skipping meals, meal planning and cooking strategies, keeping healthy foods in the home and planning for success.  Marc Gates has agreed to follow-up with our clinic fasting in 2-3 weeks. He was informed of the importance of frequent follow-up visits to maximize his  success with intensive lifestyle modifications for his multiple health conditions.   Objective:   Blood pressure 118/77, pulse 82, temperature 97.9 F (36.6 C), height 5\' 10"  (1.778 m), weight 254 lb (115.2 kg), SpO2 95 %. Body mass index is 36.45 kg/m.  General: Cooperative, alert,  well developed, in no acute distress. HEENT: Conjunctivae and lids unremarkable. Cardiovascular: Regular rhythm.  Lungs: Normal work of breathing. Neurologic: No focal deficits.   Lab Results  Component Value Date   CREATININE 0.86 04/05/2019   BUN 18 04/05/2019   NA 135 04/05/2019   K 4.3 04/05/2019   CL 101 04/05/2019   CO2 23 04/05/2019   Lab Results  Component Value Date   ALT 17 04/03/2019   AST 16 04/03/2019   ALKPHOS 138 (H) 04/03/2019   BILITOT 0.5 04/03/2019   Lab Results  Component Value Date   HGBA1C 7.5 (H) 04/03/2019   No results found for: INSULIN Lab Results  Component Value Date   TSH 1.260 05/27/2020   Lab Results  Component Value Date   CHOL 102 05/27/2020   HDL 47 05/27/2020   LDLCALC 30 05/27/2020   TRIG 146 05/27/2020   Lab Results  Component Value Date   WBC 15.3 (H) 12/31/2019   HGB 15.7 12/31/2019   HCT 47.4 12/31/2019   MCV 92.9 12/31/2019   PLT 256.0 12/31/2019   No results found for: IRON, TIBC, FERRITIN  Obesity Behavioral Intervention Documentation for Insurance:   Approximately 15 minutes were spent on the discussion below.  ASK: We discussed the diagnosis of obesity with 01/02/2020 today and Hiro agreed to give [name] permission to discuss obesity behavioral modification therapy today.  ASSESS: Marc Gates has the diagnosis of obesity and his BMI today is 36.6. Marc Gates is in the action stage of change.   ADVISE: Marc Gates was educated on the multiple health risks of obesity as well as the benefit of weight loss to improve his health. He was advised of the need for long term treatment and the importance of lifestyle modifications to improve his current health and to decrease his risk of future health problems.  AGREE: Multiple dietary modification options and treatment options were discussed and Marc Gates agreed to follow the recommendations documented in the above note.  ARRANGE: Marc Gates was educated on the importance of frequent visits to treat  obesity as outlined per CMS and USPSTF guidelines and agreed to schedule his next follow up appointment today.  Attestation Statements:   Reviewed by clinician on day of visit: allergies, medications, problem list, medical history, surgical history, family history, social history, and previous encounter notes.  [name], am acting as [name] for Energy manager, DO   I have reviewed the above documentation for accuracy and completeness, and I agree with the above. [employer], DO

## 2020-07-02 ENCOUNTER — Other Ambulatory Visit (INDEPENDENT_AMBULATORY_CARE_PROVIDER_SITE_OTHER): Payer: Self-pay | Admitting: Bariatrics

## 2020-07-02 DIAGNOSIS — E559 Vitamin D deficiency, unspecified: Secondary | ICD-10-CM

## 2020-07-06 DIAGNOSIS — J301 Allergic rhinitis due to pollen: Secondary | ICD-10-CM | POA: Diagnosis not present

## 2020-07-13 DIAGNOSIS — G4733 Obstructive sleep apnea (adult) (pediatric): Secondary | ICD-10-CM | POA: Diagnosis not present

## 2020-07-14 DIAGNOSIS — J3089 Other allergic rhinitis: Secondary | ICD-10-CM | POA: Diagnosis not present

## 2020-07-14 DIAGNOSIS — J301 Allergic rhinitis due to pollen: Secondary | ICD-10-CM | POA: Diagnosis not present

## 2020-07-16 DIAGNOSIS — J3089 Other allergic rhinitis: Secondary | ICD-10-CM | POA: Diagnosis not present

## 2020-07-20 ENCOUNTER — Encounter (INDEPENDENT_AMBULATORY_CARE_PROVIDER_SITE_OTHER): Payer: Self-pay | Admitting: Bariatrics

## 2020-07-20 ENCOUNTER — Other Ambulatory Visit: Payer: Self-pay

## 2020-07-20 ENCOUNTER — Ambulatory Visit (INDEPENDENT_AMBULATORY_CARE_PROVIDER_SITE_OTHER): Payer: Medicare PPO | Admitting: Bariatrics

## 2020-07-20 VITALS — BP 122/80 | HR 73 | Temp 97.9°F | Ht 70.0 in | Wt 249.0 lb

## 2020-07-20 DIAGNOSIS — E1169 Type 2 diabetes mellitus with other specified complication: Secondary | ICD-10-CM

## 2020-07-20 DIAGNOSIS — E559 Vitamin D deficiency, unspecified: Secondary | ICD-10-CM

## 2020-07-20 DIAGNOSIS — E669 Obesity, unspecified: Secondary | ICD-10-CM

## 2020-07-20 DIAGNOSIS — Z6835 Body mass index (BMI) 35.0-35.9, adult: Secondary | ICD-10-CM

## 2020-07-20 DIAGNOSIS — J301 Allergic rhinitis due to pollen: Secondary | ICD-10-CM | POA: Diagnosis not present

## 2020-07-20 DIAGNOSIS — J3089 Other allergic rhinitis: Secondary | ICD-10-CM | POA: Diagnosis not present

## 2020-07-20 MED ORDER — VITAMIN D (ERGOCALCIFEROL) 1.25 MG (50000 UNIT) PO CAPS: 50000.0000 [IU] | ORAL_CAPSULE | ORAL | 0 refills | Status: DC

## 2020-07-21 LAB — COMPREHENSIVE METABOLIC PANEL
ALT: 18 IU/L (ref 0–44)
AST: 17 IU/L (ref 0–40)
Albumin/Globulin Ratio: 2.1 (ref 1.2–2.2)
Albumin: 4.7 g/dL (ref 3.8–4.8)
Alkaline Phosphatase: 63 IU/L (ref 44–121)
BUN/Creatinine Ratio: 16 (ref 10–24)
BUN: 15 mg/dL (ref 8–27)
Bilirubin Total: 0.4 mg/dL (ref 0.0–1.2)
CO2: 20 mmol/L (ref 20–29)
Calcium: 9.4 mg/dL (ref 8.6–10.2)
Chloride: 102 mmol/L (ref 96–106)
Creatinine, Ser: 0.91 mg/dL (ref 0.76–1.27)
GFR calc Af Amer: 99 mL/min/{1.73_m2} (ref >59)
GFR calc non Af Amer: 86 mL/min/{1.73_m2} (ref >59)
Globulin, Total: 2.2 g/dL (ref 1.5–4.5)
Glucose: 118 mg/dL — ABNORMAL HIGH (ref 65–99)
Potassium: 4.4 mmol/L (ref 3.5–5.2)
Sodium: 139 mmol/L (ref 134–144)
Total Protein: 6.9 g/dL (ref 6.0–8.5)

## 2020-07-21 LAB — INSULIN, RANDOM: INSULIN: 40.2 u[IU]/mL — ABNORMAL HIGH (ref 2.6–24.9)

## 2020-07-21 LAB — HEMOGLOBIN A1C
Est. average glucose Bld gHb Est-mCnc: 131 mg/dL
Hgb A1c MFr Bld: 6.2 % — ABNORMAL HIGH (ref 4.8–5.6)

## 2020-07-22 DIAGNOSIS — J3089 Other allergic rhinitis: Secondary | ICD-10-CM | POA: Diagnosis not present

## 2020-07-22 DIAGNOSIS — J301 Allergic rhinitis due to pollen: Secondary | ICD-10-CM | POA: Diagnosis not present

## 2020-07-22 NOTE — Progress Notes (Signed)
Chief Complaint:   OBESITY Marc Gates is here to discuss his progress with his obesity treatment plan along with follow-up of his obesity related diagnoses. Marc Gates is on keeping a food journal and adhering to recommended goals of 1700-1800 calories and 90-100 grams of protein and states he is following his eating plan approximately 85% of the time. Marc Gates states he is walking for 7,000-10,000 steps 5-7 days per week.  Today's visit was #: 4 Starting weight: 263 lbs Starting date: 05/27/2020 Today's weight: 249 lbs Today's date: 07/20/2020 Total lbs lost to date: 14 lbs Total lbs lost since last in-office visit: 5 lbs  Interim History: Marc Gates is down an additional 5 pounds since his last visit.  He is walking more.  He is doing well with his water.  Subjective:   1. Vitamin D deficiency Marc Gates's Vitamin D level was 40.0 on 05/27/2020. He is currently taking prescription vitamin D 50,000 IU each week. He denies nausea, vomiting or muscle weakness.  2. Type 2 diabetes mellitus with obesity (HCC) Medications reviewed. Diabetic ROS: no polyuria or polydipsia, no chest pain, dyspnea or TIA's, no numbness, tingling or pain in extremities.  Taking dulaglutide, glipizide,and metformin.  Fasting blood sugars range from 110-120.  Lab Results  Component Value Date   HGBA1C 6.2 (H) 07/20/2020   HGBA1C 7.5 (H) 04/03/2019   Lab Results  Component Value Date   LDLCALC 30 05/27/2020   CREATININE 0.91 07/20/2020   Lab Results  Component Value Date   INSULIN 40.2 (H) 07/20/2020   Assessment/Plan:   1. Vitamin D deficiency Marc Gates has hyperlipidemia and has been trying to improve his cholesterol levels with intensive lifestyle modification including a low saturated fat diet, exercise and weight loss. He denies any chest pain, claudication or myalgias.  Lab Results  Component Value Date   ALT 18 07/20/2020   AST 17 07/20/2020   ALKPHOS 63 07/20/2020   BILITOT 0.4 07/20/2020   Lab Results   Component Value Date   CHOL 102 05/27/2020   HDL 47 05/27/2020   LDLCALC 30 05/27/2020   TRIG 146 05/27/2020   -Refill Vitamin D, Ergocalciferol, (DRISDOL) 1.25 MG (50000 UNIT) CAPS capsule; Take 1 capsule (50,000 Units total) by mouth every 7 (seven) days.  Dispense: 4 capsule; Refill: 0  2. Type 2 diabetes mellitus with obesity (HCC) Good blood sugar control is important to decrease the likelihood of diabetic complications such as nephropathy, neuropathy, limb loss, blindness, coronary artery disease, and death. Intensive lifestyle modification including diet, exercise and weight loss are the first line of treatment for diabetes.  Continue medications.  Check A1c, insulin, and CMP today.  Will monitor, and if blood sugar goes below 80, he will 1/2 his tablet of glipizide.  - Insulin, random - Hemoglobin A1c - Comprehensive metabolic panel  3. Class 2 severe obesity with serious comorbidity and body mass index (BMI) of 35.0 to 35.9 in adult, unspecified obesity type (HCC) Marc Gates is currently in the action stage of change. As such, his goal is to continue with weight loss efforts. He has agreed to keeping a food journal and adhering to recommended goals of 1700-1800 calories and 90-100 grams of protein.   Exercise goals: As is.   He will work on meal planning, intentional eating, and will continue to journal.  Behavioral modification strategies: increasing lean protein intake, decreasing simple carbohydrates, increasing vegetables, increasing water intake, decreasing eating out, no skipping meals, meal planning and cooking strategies, keeping healthy foods in the  home and planning for success.  Marc Gates has agreed to follow-up with our clinic in 2 weeks. He was informed of the importance of frequent follow-up visits to maximize his success with intensive lifestyle modifications for his multiple health conditions.   Marc Gates was informed we would discuss his lab results at his next visit unless  there is a critical issue that needs to be addressed sooner. Marc Gates agreed to keep his next visit at the agreed upon time to discuss these results.  Objective:   Blood pressure 122/80, pulse 73, temperature 97.9 F (36.6 C), height 5\' 10"  (1.778 m), weight 249 lb (112.9 kg), SpO2 95 %. Body mass index is 35.73 kg/m.  General: Cooperative, alert, well developed, in no acute distress. HEENT: Conjunctivae and lids unremarkable. Cardiovascular: Regular rhythm.  Lungs: Normal work of breathing. Neurologic: No focal deficits.   Lab Results  Component Value Date   CREATININE 0.91 07/20/2020   BUN 15 07/20/2020   NA 139 07/20/2020   K 4.4 07/20/2020   CL 102 07/20/2020   CO2 20 07/20/2020   Lab Results  Component Value Date   ALT 18 07/20/2020   AST 17 07/20/2020   ALKPHOS 63 07/20/2020   BILITOT 0.4 07/20/2020   Lab Results  Component Value Date   HGBA1C 6.2 (H) 07/20/2020   HGBA1C 7.5 (H) 04/03/2019   Lab Results  Component Value Date   INSULIN 40.2 (H) 07/20/2020   Lab Results  Component Value Date   TSH 1.260 05/27/2020   Lab Results  Component Value Date   CHOL 102 05/27/2020   HDL 47 05/27/2020   LDLCALC 30 05/27/2020   TRIG 146 05/27/2020   Lab Results  Component Value Date   WBC 15.3 (H) 12/31/2019   HGB 15.7 12/31/2019   HCT 47.4 12/31/2019   MCV 92.9 12/31/2019   PLT 256.0 12/31/2019   Obesity Behavioral Intervention:   Approximately 15 minutes were spent on the discussion below.  ASK: We discussed the diagnosis of obesity with 01/02/2020 today and Marc Gates agreed to give Marc Gates permission to discuss obesity behavioral modification therapy today.  ASSESS: Marc Gates has the diagnosis of obesity and his BMI today is 35.7. Marc Gates is in the action stage of change.   ADVISE: Marc Gates was educated on the multiple health risks of obesity as well as the benefit of weight loss to improve his health. He was advised of the need for long term treatment and the importance of  lifestyle modifications to improve his current health and to decrease his risk of future health problems.  AGREE: Multiple dietary modification options and treatment options were discussed and Marc Gates agreed to follow the recommendations documented in the above note.  ARRANGE: Marc Gates was educated on the importance of frequent visits to treat obesity as outlined per CMS and USPSTF guidelines and agreed to schedule his next follow up appointment today.  Attestation Statements:   Reviewed by clinician on day of visit: allergies, medications, problem list, medical history, surgical history, family history, social history, and previous encounter notes.  I, Marc Gates, CMA, am acting as Insurance claims handler for Energy manager, DO  I have reviewed the above documentation for accuracy and completeness, and I agree with the above. [employer], DO

## 2020-07-23 ENCOUNTER — Encounter (INDEPENDENT_AMBULATORY_CARE_PROVIDER_SITE_OTHER): Payer: Self-pay | Admitting: Bariatrics

## 2020-07-23 ENCOUNTER — Other Ambulatory Visit (INDEPENDENT_AMBULATORY_CARE_PROVIDER_SITE_OTHER): Payer: Self-pay | Admitting: Bariatrics

## 2020-07-23 DIAGNOSIS — E559 Vitamin D deficiency, unspecified: Secondary | ICD-10-CM

## 2020-07-27 DIAGNOSIS — J301 Allergic rhinitis due to pollen: Secondary | ICD-10-CM | POA: Diagnosis not present

## 2020-08-03 DIAGNOSIS — H35372 Puckering of macula, left eye: Secondary | ICD-10-CM | POA: Diagnosis not present

## 2020-08-03 DIAGNOSIS — H52223 Regular astigmatism, bilateral: Secondary | ICD-10-CM | POA: Diagnosis not present

## 2020-08-03 DIAGNOSIS — H524 Presbyopia: Secondary | ICD-10-CM | POA: Diagnosis not present

## 2020-08-03 DIAGNOSIS — H5213 Myopia, bilateral: Secondary | ICD-10-CM | POA: Diagnosis not present

## 2020-08-03 DIAGNOSIS — E119 Type 2 diabetes mellitus without complications: Secondary | ICD-10-CM | POA: Diagnosis not present

## 2020-08-03 DIAGNOSIS — J3089 Other allergic rhinitis: Secondary | ICD-10-CM | POA: Diagnosis not present

## 2020-08-06 ENCOUNTER — Ambulatory Visit (INDEPENDENT_AMBULATORY_CARE_PROVIDER_SITE_OTHER): Payer: Medicare PPO | Admitting: Bariatrics

## 2020-08-06 ENCOUNTER — Other Ambulatory Visit: Payer: Self-pay

## 2020-08-06 ENCOUNTER — Encounter (INDEPENDENT_AMBULATORY_CARE_PROVIDER_SITE_OTHER): Payer: Self-pay | Admitting: Bariatrics

## 2020-08-06 VITALS — BP 120/75 | HR 83 | Temp 98.0°F | Ht 70.0 in | Wt 244.0 lb

## 2020-08-06 DIAGNOSIS — E1169 Type 2 diabetes mellitus with other specified complication: Secondary | ICD-10-CM

## 2020-08-06 DIAGNOSIS — E669 Obesity, unspecified: Secondary | ICD-10-CM | POA: Diagnosis not present

## 2020-08-06 DIAGNOSIS — Z6835 Body mass index (BMI) 35.0-35.9, adult: Secondary | ICD-10-CM

## 2020-08-06 DIAGNOSIS — E559 Vitamin D deficiency, unspecified: Secondary | ICD-10-CM

## 2020-08-06 DIAGNOSIS — E785 Hyperlipidemia, unspecified: Secondary | ICD-10-CM | POA: Diagnosis not present

## 2020-08-06 MED ORDER — VITAMIN D (ERGOCALCIFEROL) 1.25 MG (50000 UNIT) PO CAPS: 50000.0000 [IU] | ORAL_CAPSULE | ORAL | 0 refills | Status: DC

## 2020-08-06 NOTE — Progress Notes (Signed)
Chief Complaint:   OBESITY Marc Gates is here to discuss his progress with his obesity treatment plan along with follow-up of his obesity related diagnoses. Marc Gates is on keeping a food journal and adhering to recommended goals of 1700-1800 calories and 90-100 grams of protein daily and states he is following his eating plan approximately 85% of the time. Marc Gates states he is walking 6,000-7,000 steps 6-7 times per week.   Today's visit was #: 5 Starting weight: 263 lbs Starting date: 05/27/2020 Today's weight: 244 lbs Today's date: 08/06/2020 Total lbs lost to date: 19 Total lbs lost since last in-office visit: 5  Interim History: Marc Gates is down 5 lbs from his last visit. He is getting adequate protein. His goal weight is 200-210 lbs.  Subjective:   1. Hyperlipidemia associated with type 2 diabetes mellitus (HCC) Marc Gates is taking Lipitor, and he denies myalgias.  2. Vitamin D deficiency Marc Gates is taking Vit D prescription, and he denies nausea, vomiting, or muscle weakness.  3. Diabetes mellitus type 2 in obese (HCC) Marc Gates is on metformin and glucotrol. His fasting BGd range between 110 and 150.  Assessment/Plan:   1. Hyperlipidemia associated with type 2 diabetes mellitus (HCC) Cardiovascular risk and specific lipid/LDL goals reviewed. We discussed several lifestyle modifications today. Marc Gates will continue Lipitor, and will continue to work on diet, exercise and weight loss efforts. Orders and follow up as documented in patient record.   Counseling Intensive lifestyle modifications are the first line treatment for this issue. . Dietary changes: Increase soluble fiber. Decrease simple carbohydrates. . Exercise changes: Moderate to vigorous-intensity aerobic activity 150 minutes per week if tolerated. . Lipid-lowering medications: see documented in medical record.  2. Vitamin D deficiency Low Vitamin D level contributes to fatigue and are associated with obesity, breast, and colon  cancer. We will refill prescription Vitamin D for 1 month. Marc Gates will follow-up for routine testing of Vitamin D, at least 2-3 times per year to avoid over-replacement.  - Vitamin D, Ergocalciferol, (DRISDOL) 1.25 MG (50000 UNIT) CAPS capsule; Take 1 capsule (50,000 Units total) by mouth every 7 (seven) days.  Dispense: 4 capsule; Refill: 0  3. Diabetes mellitus type 2 in obese (HCC) Good blood sugar control is important to decrease the likelihood of diabetic complications such as nephropathy, neuropathy, limb loss, blindness, coronary artery disease, and death. Intensive lifestyle modification including diet, exercise and weight loss are the first line of treatment for diabetes. Marc Gates will continue his medications, and he will continue to follow up as directed.  4. Class 2 severe obesity with serious comorbidity and body mass index (BMI) of 35.0 to 35.9 in adult, unspecified obesity type (HCC) Marc Gates is currently in the action stage of change. As such, his goal is to continue with weight loss efforts. He has agreed to keeping a food journal and adhering to recommended goals of 1700-1800 calories and 90-100 grams of protein daily.   Exercise goals: As is.  Behavioral modification strategies: decreasing simple carbohydrates, increasing vegetables, increasing water intake, decreasing eating out, no skipping meals, meal planning and cooking strategies, keeping healthy foods in the home and planning for success.  Marc Gates has agreed to follow-up with our clinic in 2 to 3 weeks. He was informed of the importance of frequent follow-up visits to maximize his success with intensive lifestyle modifications for his multiple health conditions.   Objective:   Blood pressure 120/75, pulse 83, temperature 98 F (36.7 C), height 5\' 10"  (1.778 m), weight 244 lb (110.7  kg), SpO2 95 %. Body mass index is 35.01 kg/m.  General: Cooperative, alert, well developed, in no acute distress. HEENT: Conjunctivae and lids  unremarkable. Cardiovascular: Regular rhythm.  Lungs: Normal work of breathing. Neurologic: No focal deficits.   Lab Results  Component Value Date   CREATININE 0.91 07/20/2020   BUN 15 07/20/2020   NA 139 07/20/2020   K 4.4 07/20/2020   CL 102 07/20/2020   CO2 20 07/20/2020   Lab Results  Component Value Date   ALT 18 07/20/2020   AST 17 07/20/2020   ALKPHOS 63 07/20/2020   BILITOT 0.4 07/20/2020   Lab Results  Component Value Date   HGBA1C 6.2 (H) 07/20/2020   HGBA1C 7.5 (H) 04/03/2019   Lab Results  Component Value Date   INSULIN 40.2 (H) 07/20/2020   Lab Results  Component Value Date   TSH 1.260 05/27/2020   Lab Results  Component Value Date   CHOL 102 05/27/2020   HDL 47 05/27/2020   LDLCALC 30 05/27/2020   TRIG 146 05/27/2020   Lab Results  Component Value Date   WBC 15.3 (H) 12/31/2019   HGB 15.7 12/31/2019   HCT 47.4 12/31/2019   MCV 92.9 12/31/2019   PLT 256.0 12/31/2019   No results found for: IRON, TIBC, FERRITIN  Obesity Behavioral Intervention:   Approximately 15 minutes were spent on the discussion below.  ASK: We discussed the diagnosis of obesity with Marc Gates today and Marc Gates agreed to give [name] permission to discuss obesity behavioral modification therapy today.  ASSESS: Marc Gates has the diagnosis of obesity and his BMI today is 35.01. Marc Gates is in the action stage of change.   ADVISE: Marc Gates was educated on the multiple health risks of obesity as well as the benefit of weight loss to improve his health. He was advised of the need for long term treatment and the importance of lifestyle modifications to improve his current health and to decrease his risk of future health problems.  AGREE: Multiple dietary modification options and treatment options were discussed and Marc Gates agreed to follow the recommendations documented in the above note.  ARRANGE: Marc Gates was educated on the importance of frequent visits to treat obesity as outlined per CMS and  USPSTF guidelines and agreed to schedule his next follow up appointment today.  Attestation Statements:   Reviewed by clinician on day of visit: allergies, medications, problem list, medical history, surgical history, family history, social history, and previous encounter notes.   [name], am acting as Energy manager for [employer], DO.  I have reviewed the above documentation for accuracy and completeness, and I agree with the above. [name], DO

## 2020-08-10 DIAGNOSIS — G4733 Obstructive sleep apnea (adult) (pediatric): Secondary | ICD-10-CM | POA: Diagnosis not present

## 2020-08-10 DIAGNOSIS — L820 Inflamed seborrheic keratosis: Secondary | ICD-10-CM | POA: Diagnosis not present

## 2020-08-10 DIAGNOSIS — J3089 Other allergic rhinitis: Secondary | ICD-10-CM | POA: Diagnosis not present

## 2020-08-10 DIAGNOSIS — J301 Allergic rhinitis due to pollen: Secondary | ICD-10-CM | POA: Diagnosis not present

## 2020-08-10 DIAGNOSIS — L570 Actinic keratosis: Secondary | ICD-10-CM | POA: Diagnosis not present

## 2020-08-12 DIAGNOSIS — G4733 Obstructive sleep apnea (adult) (pediatric): Secondary | ICD-10-CM | POA: Diagnosis not present

## 2020-08-13 ENCOUNTER — Other Ambulatory Visit (INDEPENDENT_AMBULATORY_CARE_PROVIDER_SITE_OTHER): Payer: Self-pay | Admitting: Bariatrics

## 2020-08-13 DIAGNOSIS — E559 Vitamin D deficiency, unspecified: Secondary | ICD-10-CM

## 2020-08-17 DIAGNOSIS — M170 Bilateral primary osteoarthritis of knee: Secondary | ICD-10-CM | POA: Diagnosis not present

## 2020-08-17 DIAGNOSIS — M1712 Unilateral primary osteoarthritis, left knee: Secondary | ICD-10-CM | POA: Diagnosis not present

## 2020-08-17 DIAGNOSIS — M1711 Unilateral primary osteoarthritis, right knee: Secondary | ICD-10-CM | POA: Diagnosis not present

## 2020-08-17 DIAGNOSIS — J301 Allergic rhinitis due to pollen: Secondary | ICD-10-CM | POA: Diagnosis not present

## 2020-08-17 DIAGNOSIS — M25461 Effusion, right knee: Secondary | ICD-10-CM | POA: Diagnosis not present

## 2020-08-18 DIAGNOSIS — R531 Weakness: Secondary | ICD-10-CM | POA: Diagnosis not present

## 2020-08-18 DIAGNOSIS — M170 Bilateral primary osteoarthritis of knee: Secondary | ICD-10-CM | POA: Diagnosis not present

## 2020-08-19 DIAGNOSIS — R531 Weakness: Secondary | ICD-10-CM | POA: Diagnosis not present

## 2020-08-19 DIAGNOSIS — M170 Bilateral primary osteoarthritis of knee: Secondary | ICD-10-CM | POA: Diagnosis not present

## 2020-08-20 DIAGNOSIS — E785 Hyperlipidemia, unspecified: Secondary | ICD-10-CM | POA: Diagnosis not present

## 2020-08-20 DIAGNOSIS — N400 Enlarged prostate without lower urinary tract symptoms: Secondary | ICD-10-CM | POA: Diagnosis not present

## 2020-08-20 DIAGNOSIS — E781 Pure hyperglyceridemia: Secondary | ICD-10-CM | POA: Diagnosis not present

## 2020-08-20 DIAGNOSIS — I2510 Atherosclerotic heart disease of native coronary artery without angina pectoris: Secondary | ICD-10-CM | POA: Diagnosis not present

## 2020-08-20 DIAGNOSIS — L602 Onychogryphosis: Secondary | ICD-10-CM | POA: Diagnosis not present

## 2020-08-20 DIAGNOSIS — E1169 Type 2 diabetes mellitus with other specified complication: Secondary | ICD-10-CM | POA: Diagnosis not present

## 2020-08-20 DIAGNOSIS — E1351 Other specified diabetes mellitus with diabetic peripheral angiopathy without gangrene: Secondary | ICD-10-CM | POA: Diagnosis not present

## 2020-08-20 DIAGNOSIS — I10 Essential (primary) hypertension: Secondary | ICD-10-CM | POA: Diagnosis not present

## 2020-08-20 DIAGNOSIS — K219 Gastro-esophageal reflux disease without esophagitis: Secondary | ICD-10-CM | POA: Diagnosis not present

## 2020-08-20 DIAGNOSIS — L84 Corns and callosities: Secondary | ICD-10-CM | POA: Diagnosis not present

## 2020-08-24 DIAGNOSIS — J3089 Other allergic rhinitis: Secondary | ICD-10-CM | POA: Diagnosis not present

## 2020-08-24 DIAGNOSIS — J301 Allergic rhinitis due to pollen: Secondary | ICD-10-CM | POA: Diagnosis not present

## 2020-08-26 ENCOUNTER — Encounter: Payer: Self-pay | Admitting: Physician Assistant

## 2020-08-26 ENCOUNTER — Telehealth (INDEPENDENT_AMBULATORY_CARE_PROVIDER_SITE_OTHER): Payer: Medicare PPO | Admitting: Physician Assistant

## 2020-08-26 DIAGNOSIS — G4733 Obstructive sleep apnea (adult) (pediatric): Secondary | ICD-10-CM

## 2020-08-26 DIAGNOSIS — M170 Bilateral primary osteoarthritis of knee: Secondary | ICD-10-CM | POA: Diagnosis not present

## 2020-08-26 DIAGNOSIS — G4700 Insomnia, unspecified: Secondary | ICD-10-CM

## 2020-08-26 DIAGNOSIS — F411 Generalized anxiety disorder: Secondary | ICD-10-CM | POA: Diagnosis not present

## 2020-08-26 DIAGNOSIS — R531 Weakness: Secondary | ICD-10-CM | POA: Diagnosis not present

## 2020-08-26 MED ORDER — BUSPIRONE HCL 10 MG PO TABS: 20.0000 mg | ORAL_TABLET | Freq: Two times a day (BID) | ORAL | 1 refills | Status: DC

## 2020-08-26 MED ORDER — ALPRAZOLAM 0.25 MG PO TABS: 0.2500 mg | ORAL_TABLET | Freq: Two times a day (BID) | ORAL | 0 refills | Status: DC | PRN

## 2020-08-26 NOTE — Progress Notes (Signed)
[facility]  Patient ID: Marc Gates,  MRN: [id number]  PCP: Kirby Funk, MD  Date of Evaluation: 08/26/2020 Time spent:20 minutes  Chief Complaint:  Chief Complaint    Anxiety; Medication Refill     Virtual Visit via Telehealth  I connected with patient by telephone, with their informed consent, and verified patient privacy and that I am speaking with the correct person using two identifiers.  I am private, in my office and the patient is at home.  I discussed the limitations, risks, security and privacy concerns of performing an evaluation and management service by telephone video and the availability of in person appointments. I also discussed with the patient that there may be a patient responsible charge related to this service. The patient expressed understanding and agreed to proceed.   I discussed the assessment and treatment plan with the patient. The patient was provided an opportunity to ask questions and all were answered. The patient agreed with the plan and demonstrated an understanding of the instructions.   The patient was advised to call back or seek an in-person evaluation if the symptoms worsen or if the condition fails to improve as anticipated.  I provided 20  minutes of non-face-to-face time during this encounter.  HISTORY/CURRENT STATUS: HPI For routine med check.  Doing great! Not needing the Xanax hardly ever!  Hasn't had a panic attack in a very long time.  Last time was when he went to the dentist.  That's not really even for anxiety, but more when he is laid back he has trouble breathing so the Xanax helps with that.  He wonders if he still needs the BuSpar or is the BuSpar what is causing him to be less anxious.  "I just don't know.  What do you think?"  He has gone down from 20 mg 3 times daily to 20 mg twice daily on his own and has been fine.   He is still using the Ambien prescribed by his PCP.  He uses it just as much for sleep as he  does overactive bladder.  It helps him sleep so he doesn't have to get up and use the bathroom often.  He is using his CPAP every night.  He is able to enjoy things.  He is going to [place] next week and is looking forward to that.  Energy and motivation are good.  Not isolating.  He is continuing to walk daily and is losing weight.  He is trying to be as healthy as possible so he won't need to have a knee replacement in the future.  Or have any other problems for that matter.  No suicidal or homicidal thoughts.  Denies dizziness, syncope, seizures, numbness, tingling, tremor, tics, unsteady gait, slurred speech, confusion. Denies muscle or joint pain, stiffness, or dystonia.  Individual Medical History/ Review of Systems: Changes? :No    Past medications for mental health diagnoses include: Zoloft ? Unsure of the antidpressant.  He only took 1 pill because he slept the whole next day.  Allergies: Penicillins  Current Medications:  Current Outpatient Medications:  .  ALPRAZolam (XANAX) 0.25 MG tablet, Take 1 tablet (0.25 mg total) by mouth 2 (two) times daily as needed for anxiety., Disp: 30 tablet, Rfl: 0 .  amLODipine-valsartan (EXFORGE) 5-160 MG tablet, Take 1 tablet by mouth daily., Disp: , Rfl:  .  aspirin EC 81 MG tablet, Take 81 mg by mouth daily., Disp: , Rfl:  .  atorvastatin (LIPITOR) 20 MG  tablet, Take 20 mg by mouth every evening., Disp: , Rfl:  .  benzonatate (TESSALON) 200 MG capsule, Take 200 mg by mouth 3 (three) times daily as needed. , Disp: , Rfl:  .  busPIRone (BUSPAR) 10 MG tablet, Take 2 tablets (20 mg total) by mouth 2 (two) times daily., Disp: 540 tablet, Rfl: 1 .  chlorpheniramine (CHLOR-TRIMETON) 4 MG tablet, Take 4 mg by mouth 3 (three) times daily as needed for allergies., Disp: , Rfl:  .  Dulaglutide (TRULICITY) 1.5 MG/0.5ML SOPN, Inject 3 mg into the skin every Friday. , Disp: , Rfl:  .  EPINEPHrine 0.3 mg/0.3 mL IJ SOAJ injection, Inject 0.3 mg into the muscle as  needed for anaphylaxis. , Disp: , Rfl:  .  famotidine (PEPCID) 10 MG tablet, Take 10 mg by mouth 2 (two) times daily., Disp: , Rfl:  .  finasteride (PROSCAR) 5 MG tablet, Take 5 mg by mouth every evening., Disp: , Rfl:  .  fluticasone (FLONASE) 50 MCG/ACT nasal spray, Place 2 sprays into both nostrils every evening. , Disp: , Rfl:  .  glipiZIDE (GLUCOTROL) 5 MG tablet, Take 1 tablet (5 mg total) by mouth daily before breakfast., Disp: 30 tablet, Rfl: 0 .  glucose blood test strip, 1 each by Other route as needed for other. Use as instructed, Disp: , Rfl:  .  levocetirizine (XYZAL) 5 MG tablet, Take 5 mg by mouth every evening., Disp: , Rfl:  .  Magnesium 250 MG TABS, Take by mouth., Disp: , Rfl:  .  Menthol, Topical Analgesic, (BIOFREEZE EX), Apply 1 application topically 4 (four) times daily as needed (pain.). , Disp: , Rfl:  .  metFORMIN (GLUCOPHAGE-XR) 500 MG 24 hr tablet, Take 1,000 mg by mouth 2 (two) times daily., Disp: , Rfl:  .  montelukast (SINGULAIR) 10 MG tablet, Take 10 mg by mouth at bedtime., Disp: , Rfl:  .  MYRBETRIQ 50 MG TB24 tablet, Take 50 mg by mouth every evening., Disp: , Rfl:  .  pantoprazole (PROTONIX) 40 MG tablet, Take 40 mg by mouth 2 (two) times daily. , Disp: , Rfl:  .  Vitamin D, Ergocalciferol, (DRISDOL) 1.25 MG (50000 UNIT) CAPS capsule, Take 1 capsule (50,000 Units total) by mouth every 7 (seven) days., Disp: 4 capsule, Rfl: 0 .  zolpidem (AMBIEN) 10 MG tablet, Take 10 mg by mouth at bedtime. , Disp: , Rfl:  .  LEVALBUTEROL TARTRATE IN, Inhale 45 mcg into the lungs. (Patient not taking: Reported on 08/26/2020), Disp: , Rfl:  .  Zinc 50 MG CAPS, Take by mouth. (Patient not taking: Reported on 08/26/2020), Disp: , Rfl:  Medication Side Effects: none  Family Medical/ Social History: Changes? No  MENTAL HEALTH EXAM:  There were no vitals taken for this visit.There is no height or weight on file to calculate BMI.  General Appearance: Unable to assess  Eye  Contact:  Unable to assess  Speech:  Clear and Coherent and Normal Rate  Volume:  Normal  Mood:  Euthymic  Affect:  Unable to assess  Thought Process:  Goal Directed and Descriptions of Associations: Intact  Orientation:  Full (Time, Place, and Person)  Thought Content: Logical   Suicidal Thoughts:  No  Homicidal Thoughts:  No  Memory:  WNL  Judgement:  Good  Insight:  Good  Psychomotor Activity:  Unable to assess  Concentration:  Concentration: Good  Recall:  Good  Fund of Knowledge: Good  Language: Good  Assets:  Desire for Improvement  ADL's:  Intact  Cognition: WNL  Prognosis:  Good    DIAGNOSES:    ICD-10-CM   1. Generalized anxiety disorder  F41.1   2. Insomnia, unspecified type  G47.00   3. Obstructive sleep apnea  G47.33     Receiving Psychotherapy: No    RECOMMENDATIONS:  PDMP was reviewed. I provided 20 minutes of nonface-to-face time during this encounter. I'm glad to see him doing so well! We discussed whether he should come off of the BuSpar or not.  It is really an unknown as to whether the BuSpar is preventing the anxiety, or he is just not as anxious as he used to be.  He is retired now and doesn't have as much stress as he used to.  The only way we will know is to have him get off of the BuSpar.  I am fine with trying that if he chooses to do so.  He is still thinking about it and if he does it, he will wait until he gets back from [place] in a few weeks. It's not absolutely necessary to wean off of the BuSpar but I would recommend it in his case because he does seem so sensitive to the anxiety.  If he chooses to wean off, I would recommend that he go down from a total of 40 mg total per day (10 mg, 2 p.o. twice daily currently) to a total of 30 mg divided during the day for 2 weeks, then 20 mg divided throughout the day for 2 weeks, then 10 mg daily for 2 weeks and then stop.  If at any point, he feels more anxious then he needs to go back up to the previous  dose.  He verbalizes understanding. Having said all that, I am sending in a prescription for the current dose of BuSpar.  Continue BuSpar 10 mg, 2 p.o. 2 times daily. Continue Xanax 0.25 mg, 1/2-1 p.o. every 6 hours as needed.   Continue Ambien 10 mg, 1-2 nightly as needed sleep.  Provided by PCP. Continue CPAP. Return in 6 months.  Melony Overly, PA-C

## 2020-08-27 ENCOUNTER — Other Ambulatory Visit: Payer: Self-pay

## 2020-08-27 ENCOUNTER — Encounter (INDEPENDENT_AMBULATORY_CARE_PROVIDER_SITE_OTHER): Payer: Self-pay | Admitting: Bariatrics

## 2020-08-27 ENCOUNTER — Ambulatory Visit (INDEPENDENT_AMBULATORY_CARE_PROVIDER_SITE_OTHER): Payer: Medicare PPO | Admitting: Bariatrics

## 2020-08-27 VITALS — BP 148/84 | HR 76 | Temp 97.8°F | Ht 70.0 in | Wt 238.0 lb

## 2020-08-27 DIAGNOSIS — E559 Vitamin D deficiency, unspecified: Secondary | ICD-10-CM

## 2020-08-27 DIAGNOSIS — E669 Obesity, unspecified: Secondary | ICD-10-CM | POA: Diagnosis not present

## 2020-08-27 DIAGNOSIS — I152 Hypertension secondary to endocrine disorders: Secondary | ICD-10-CM | POA: Diagnosis not present

## 2020-08-27 DIAGNOSIS — E1159 Type 2 diabetes mellitus with other circulatory complications: Secondary | ICD-10-CM | POA: Diagnosis not present

## 2020-08-27 DIAGNOSIS — Z6834 Body mass index (BMI) 34.0-34.9, adult: Secondary | ICD-10-CM

## 2020-08-27 DIAGNOSIS — E1169 Type 2 diabetes mellitus with other specified complication: Secondary | ICD-10-CM | POA: Diagnosis not present

## 2020-08-27 MED ORDER — VITAMIN D (ERGOCALCIFEROL) 1.25 MG (50000 UNIT) PO CAPS: 50000.0000 [IU] | ORAL_CAPSULE | ORAL | 0 refills | Status: DC

## 2020-08-31 DIAGNOSIS — M170 Bilateral primary osteoarthritis of knee: Secondary | ICD-10-CM | POA: Diagnosis not present

## 2020-08-31 DIAGNOSIS — R531 Weakness: Secondary | ICD-10-CM | POA: Diagnosis not present

## 2020-08-31 NOTE — Progress Notes (Signed)
Chief Complaint:   OBESITY Marc Gates is here to discuss his progress with his obesity treatment plan along with follow-up of his obesity related diagnoses. Marc Gates is keeping a food journal and adhering to recommended goals of 1700-1800 calories and 90-100 grams of protein and states he is following his eating plan approximately 100% of the time. Marc Gates states he is walking 7,500-10,000 steps 7 times per week.  Today's visit was #: 6 Starting weight: 263 lbs Starting date: 05/27/2020 Today's weight: 238 lbs Today's date: 08/27/2020 Total lbs lost to date: 25 Total lbs lost since last in-office visit: 6  Interim History: Marc Gates is down 6 lbs since his last visit.  Subjective:   Hypertension associated with diabetes (HCC). Blood pressure is controlled.  BP Readings from Last 3 Encounters:  08/27/20 (!) 148/84  08/06/20 120/75  07/20/20 122/80   Lab Results  Component Value Date   CREATININE 0.91 07/20/2020   CREATININE 0.86 04/05/2019   CREATININE 0.83 04/04/2019   Vitamin D deficiency. No nausea, vomiting, or muscle weakness.   Type 2 diabetes mellitus with obesity (HCC). Fasting blood sugars average 130.   Lab Results  Component Value Date   HGBA1C 6.2 (H) 07/20/2020   HGBA1C 7.5 (H) 04/03/2019   Lab Results  Component Value Date   LDLCALC 30 05/27/2020   CREATININE 0.91 07/20/2020   Lab Results  Component Value Date   INSULIN 40.2 (H) 07/20/2020   Assessment/Plan:   Hypertension associated with diabetes (HCC). Marc Gates is working on healthy weight loss and exercise to improve blood pressure control. We will watch for signs of hypotension as he continues his lifestyle modifications. He will continue his medication as directed.   Vitamin D deficiency. Low Vitamin D level contributes to fatigue and are associated with obesity, breast, and colon cancer. He was given a prescription for Vitamin D, Ergocalciferol, (DRISDOL) 1.25 MG (50000 UNIT) CAPS capsule every 14  days #4 with 0 refills and will follow-up for routine testing of Vitamin D, at least 2-3 times per year to avoid over-replacement.   Type 2 diabetes mellitus with obesity (HCC). Good blood sugar control is important to decrease the likelihood of diabetic complications such as nephropathy, neuropathy, limb loss, blindness, coronary artery disease, and death. Intensive lifestyle modification including diet, exercise and weight loss are the first line of treatment for diabetes. Marc Gates will continue to check blood sugars while on vacation.  Class 1 obesity with serious comorbidity and body mass index (BMI) of 34.0 to 34.9 in adult, unspecified obesity type.  Marc Gates is currently in the action stage of change. As such, his goal is to continue with weight loss efforts. He has agreed to keeping a food journal and adhering to recommended goals of 1700-1800 calories and 90-100 grams of protein.   He will work on meal planning, intentional eating, and increasing his water and protein intake.   Exercise goals: Marc Gates will continue leg exercises.  Behavioral modification strategies: increasing lean protein intake, decreasing simple carbohydrates, increasing vegetables, increasing water intake, decreasing eating out, no skipping meals, meal planning and cooking strategies, keeping healthy foods in the home and planning for success.  Marc Gates has agreed to follow-up with our clinic in 3-4 weeks. He was informed of the importance of frequent follow-up visits to maximize his success with intensive lifestyle modifications for his multiple health conditions.   Objective:   Blood pressure (!) 148/84, pulse 76, temperature 97.8 F (36.6 C), height 5\' 10"  (1.778 m), weight  238 lb (108 kg), SpO2 95 %. Body mass index is 34.15 kg/m.  General: Cooperative, alert, well developed, in no acute distress. HEENT: Conjunctivae and lids unremarkable. Cardiovascular: Regular rhythm.  Lungs: Normal work of breathing. Neurologic:  No focal deficits.   Lab Results  Component Value Date   CREATININE 0.91 07/20/2020   BUN 15 07/20/2020   NA 139 07/20/2020   K 4.4 07/20/2020   CL 102 07/20/2020   CO2 20 07/20/2020   Lab Results  Component Value Date   ALT 18 07/20/2020   AST 17 07/20/2020   ALKPHOS 63 07/20/2020   BILITOT 0.4 07/20/2020   Lab Results  Component Value Date   HGBA1C 6.2 (H) 07/20/2020   HGBA1C 7.5 (H) 04/03/2019   Lab Results  Component Value Date   INSULIN 40.2 (H) 07/20/2020   Lab Results  Component Value Date   TSH 1.260 05/27/2020   Lab Results  Component Value Date   CHOL 102 05/27/2020   HDL 47 05/27/2020   LDLCALC 30 05/27/2020   TRIG 146 05/27/2020   Lab Results  Component Value Date   WBC 15.3 (H) 12/31/2019   HGB 15.7 12/31/2019   HCT 47.4 12/31/2019   MCV 92.9 12/31/2019   PLT 256.0 12/31/2019   No results found for: IRON, TIBC, FERRITIN  Obesity Behavioral Intervention:   Approximately 15 minutes were spent on the discussion below.  ASK: We discussed the diagnosis of obesity with Marc Gates today and Ean agreed to give [name] permission to discuss obesity behavioral modification therapy today.  ASSESS: Marc Gates has the diagnosis of obesity and his BMI today is 34.3. Marc Gates is in the action stage of change.   ADVISE: Marc Gates was educated on the multiple health risks of obesity as well as the benefit of weight loss to improve his health. He was advised of the need for long term treatment and the importance of lifestyle modifications to improve his current health and to decrease his risk of future health problems.  AGREE: Multiple dietary modification options and treatment options were discussed and Jaquari agreed to follow the recommendations documented in the above note.  ARRANGE: Marc Gates was educated on the importance of frequent visits to treat obesity as outlined per CMS and USPSTF guidelines and agreed to schedule his next follow up appointment today.  Attestation  Statements:   Reviewed by clinician on day of visit: allergies, medications, problem list, medical history, surgical history, family history, social history, and previous encounter notes.  Marc Gates, am acting as Energy manager for [employer], DO   I have reviewed the above documentation for accuracy and completeness, and I agree with the above. Marc Capra, DO

## 2020-09-01 DIAGNOSIS — J301 Allergic rhinitis due to pollen: Secondary | ICD-10-CM | POA: Diagnosis not present

## 2020-09-02 DIAGNOSIS — M170 Bilateral primary osteoarthritis of knee: Secondary | ICD-10-CM | POA: Diagnosis not present

## 2020-09-02 DIAGNOSIS — R531 Weakness: Secondary | ICD-10-CM | POA: Diagnosis not present

## 2020-09-03 ENCOUNTER — Other Ambulatory Visit (INDEPENDENT_AMBULATORY_CARE_PROVIDER_SITE_OTHER): Payer: Self-pay | Admitting: Bariatrics

## 2020-09-03 DIAGNOSIS — E559 Vitamin D deficiency, unspecified: Secondary | ICD-10-CM

## 2020-09-05 ENCOUNTER — Encounter (INDEPENDENT_AMBULATORY_CARE_PROVIDER_SITE_OTHER): Payer: Self-pay | Admitting: Bariatrics

## 2020-09-07 DIAGNOSIS — R531 Weakness: Secondary | ICD-10-CM | POA: Diagnosis not present

## 2020-09-07 DIAGNOSIS — M170 Bilateral primary osteoarthritis of knee: Secondary | ICD-10-CM | POA: Diagnosis not present

## 2020-09-07 DIAGNOSIS — J301 Allergic rhinitis due to pollen: Secondary | ICD-10-CM | POA: Diagnosis not present

## 2020-09-12 DIAGNOSIS — G4733 Obstructive sleep apnea (adult) (pediatric): Secondary | ICD-10-CM | POA: Diagnosis not present

## 2020-09-16 DIAGNOSIS — J301 Allergic rhinitis due to pollen: Secondary | ICD-10-CM | POA: Diagnosis not present

## 2020-09-16 DIAGNOSIS — J3089 Other allergic rhinitis: Secondary | ICD-10-CM | POA: Diagnosis not present

## 2020-09-17 DIAGNOSIS — R531 Weakness: Secondary | ICD-10-CM | POA: Diagnosis not present

## 2020-09-17 DIAGNOSIS — M170 Bilateral primary osteoarthritis of knee: Secondary | ICD-10-CM | POA: Diagnosis not present

## 2020-09-21 DIAGNOSIS — J301 Allergic rhinitis due to pollen: Secondary | ICD-10-CM | POA: Diagnosis not present

## 2020-09-21 DIAGNOSIS — J3089 Other allergic rhinitis: Secondary | ICD-10-CM | POA: Diagnosis not present

## 2020-09-27 ENCOUNTER — Other Ambulatory Visit (INDEPENDENT_AMBULATORY_CARE_PROVIDER_SITE_OTHER): Payer: Self-pay | Admitting: Bariatrics

## 2020-09-27 DIAGNOSIS — E559 Vitamin D deficiency, unspecified: Secondary | ICD-10-CM

## 2020-09-28 DIAGNOSIS — J3089 Other allergic rhinitis: Secondary | ICD-10-CM | POA: Diagnosis not present

## 2020-09-28 DIAGNOSIS — J301 Allergic rhinitis due to pollen: Secondary | ICD-10-CM | POA: Diagnosis not present

## 2020-09-29 ENCOUNTER — Ambulatory Visit (INDEPENDENT_AMBULATORY_CARE_PROVIDER_SITE_OTHER): Payer: Medicare PPO | Admitting: Bariatrics

## 2020-09-29 ENCOUNTER — Other Ambulatory Visit: Payer: Self-pay

## 2020-09-29 ENCOUNTER — Encounter (INDEPENDENT_AMBULATORY_CARE_PROVIDER_SITE_OTHER): Payer: Self-pay | Admitting: Bariatrics

## 2020-09-29 VITALS — BP 132/84 | HR 75 | Temp 97.5°F | Ht 70.0 in | Wt 236.0 lb

## 2020-09-29 DIAGNOSIS — E6609 Other obesity due to excess calories: Secondary | ICD-10-CM

## 2020-09-29 DIAGNOSIS — E1169 Type 2 diabetes mellitus with other specified complication: Secondary | ICD-10-CM | POA: Diagnosis not present

## 2020-09-29 DIAGNOSIS — Z6833 Body mass index (BMI) 33.0-33.9, adult: Secondary | ICD-10-CM | POA: Diagnosis not present

## 2020-09-29 DIAGNOSIS — E669 Obesity, unspecified: Secondary | ICD-10-CM

## 2020-09-29 DIAGNOSIS — E559 Vitamin D deficiency, unspecified: Secondary | ICD-10-CM

## 2020-09-29 MED ORDER — VITAMIN D (ERGOCALCIFEROL) 1.25 MG (50000 UNIT) PO CAPS: 50000.0000 [IU] | ORAL_CAPSULE | ORAL | 0 refills | Status: DC

## 2020-09-30 NOTE — Progress Notes (Signed)
Chief Complaint:   OBESITY Marc Gates is here to discuss his progress with his obesity treatment plan along with follow-up of his obesity related diagnoses. Marc Gates is keeping a food journal and adhering to recommended goals of 1700-1800 calories and 90-100 grams of  protein and states he is following his eating plan approximately 50% of the time. [name] states he walked in [place] 22,000 steps 7 times per week.  Today's visit was #: 7 Starting weight: 263 lbs Starting date: 05/27/2020 Today's weight: 236 lbs Today's date: 09/29/2020 Total lbs lost to date: 27 Total lbs lost since last in-office visit: 2  Interim History: Marc Gates has been on vacation in [place], [place]. He has been very active walking 22,000 steps most days.  Subjective:   Type 2 diabetes mellitus with obesity (HCC). [name] stopped Glucotrol; is now taking Dulaglutide and metformin.  Lab Results  Component Value Date   HGBA1C 6.2 (H) 07/20/2020   HGBA1C 7.5 (H) 04/03/2019   Lab Results  Component Value Date   LDLCALC 30 05/27/2020   CREATININE 0.91 07/20/2020   Lab Results  Component Value Date   INSULIN 40.2 (H) 07/20/2020   Vitamin D deficiency. [name] is taking Vitamin D supplementation.    Ref. Range 05/27/2020 13:26  Vitamin D, 25-Hydroxy Latest Ref Range: 30.0 - 100.0 ng/mL 40.0   Assessment/Plan:   Type 2 diabetes mellitus with obesity (HCC). Good blood sugar control is important to decrease the likelihood of diabetic complications such as nephropathy, neuropathy, limb loss, blindness, coronary artery disease, and death. Intensive lifestyle modification including diet, exercise and weight loss are the first line of treatment for diabetes. Future will continue his medications as directed and will stay off the Glucotrol.  Vitamin D deficiency. Low Vitamin D level contributes to fatigue and are associated with obesity, breast, and colon cancer. He was given a prescription for Vitamin D, Ergocalciferol,  (DRISDOL) 1.25 MG (50000 UNIT) CAPS capsule every 2 weeks #2 with 0 refills and will follow-up for routine testing of Vitamin D, at least 2-3 times per year to avoid over-replacement.   Class 1 obesity with serious comorbidity and body mass index (BMI) of 33.0 to 33.9 in adult, unspecified obesity type.  [facility] is currently in the action stage of change. As such, his goal is to continue with weight loss efforts. He has agreed to keeping a food journal and adhering to recommended goals of 1700-1800 calories and 90-100 grams of protein.   He will work on meal planning and mindful eating.   Exercise goals: [name] will continue doing orthopedic exercises.  Behavioral modification strategies: increasing lean protein intake, decreasing simple carbohydrates, increasing vegetables, increasing water intake, decreasing eating out, no skipping meals, meal planning and cooking strategies, keeping healthy foods in the home, travel eating strategies, holiday eating strategies , celebration eating strategies and avoiding temptations.  [name] has agreed to follow-up with our clinic in 3 weeks. He was informed of the importance of frequent follow-up visits to maximize his success with intensive lifestyle modifications for his multiple health conditions.   Objective:   Blood pressure 132/84, pulse 75, temperature (!) 97.5 F (36.4 C), height 5\' 10"  (1.778 m), weight 236 lb (107 kg), SpO2 97 %. Body mass index is 33.86 kg/m.  General: Cooperative, alert, well developed, in no acute distress. HEENT: Conjunctivae and lids unremarkable. Cardiovascular: Regular rhythm.  Lungs: Normal work of breathing. Neurologic: No focal deficits.   Lab Results  Component Value Date   CREATININE  0.91 07/20/2020   BUN 15 07/20/2020   NA 139 07/20/2020   K 4.4 07/20/2020   CL 102 07/20/2020   CO2 20 07/20/2020   Lab Results  Component Value Date   ALT 18 07/20/2020   AST 17 07/20/2020   ALKPHOS 63 07/20/2020   BILITOT  0.4 07/20/2020   Lab Results  Component Value Date   HGBA1C 6.2 (H) 07/20/2020   HGBA1C 7.5 (H) 04/03/2019   Lab Results  Component Value Date   INSULIN 40.2 (H) 07/20/2020   Lab Results  Component Value Date   TSH 1.260 05/27/2020   Lab Results  Component Value Date   CHOL 102 05/27/2020   HDL 47 05/27/2020   LDLCALC 30 05/27/2020   TRIG 146 05/27/2020   Lab Results  Component Value Date   WBC 15.3 (H) 12/31/2019   HGB 15.7 12/31/2019   HCT 47.4 12/31/2019   MCV 92.9 12/31/2019   PLT 256.0 12/31/2019   No results found for: IRON, TIBC, FERRITIN  Obesity Behavioral Intervention:   Approximately 15 minutes were spent on the discussion below.  ASK: We discussed the diagnosis of obesity with Marc Gates today and Marc Gates agreed to give [name] permission to discuss obesity behavioral modification therapy today.  ASSESS: Marc Gates has the diagnosis of obesity and his BMI today is 33.9. Marc Gates is in the action stage of change.   ADVISE: [name] was educated on the multiple health risks of obesity as well as the benefit of weight loss to improve his health. He was advised of the need for long term treatment and the importance of lifestyle modifications to improve his current health and to decrease his risk of future health problems.  AGREE: Multiple dietary modification options and treatment options were discussed and Marc Gates agreed to follow the recommendations documented in the above note.  ARRANGE: Marc Gates was educated on the importance of frequent visits to treat obesity as outlined per CMS and USPSTF guidelines and agreed to schedule his next follow up appointment today.  Attestation Statements:   Reviewed by clinician on day of visit: allergies, medications, problem list, medical history, surgical history, family history, social history, and previous encounter notes.  Marc Gates, am acting as Energy manager for [employer], DO   I have reviewed the above documentation for  accuracy and completeness, and I agree with the above. Marc Capra, DO

## 2020-10-03 ENCOUNTER — Encounter (INDEPENDENT_AMBULATORY_CARE_PROVIDER_SITE_OTHER): Payer: Self-pay | Admitting: Bariatrics

## 2020-10-03 DIAGNOSIS — E6609 Other obesity due to excess calories: Secondary | ICD-10-CM | POA: Insufficient documentation

## 2020-10-05 DIAGNOSIS — M170 Bilateral primary osteoarthritis of knee: Secondary | ICD-10-CM | POA: Diagnosis not present

## 2020-10-05 DIAGNOSIS — J301 Allergic rhinitis due to pollen: Secondary | ICD-10-CM | POA: Diagnosis not present

## 2020-10-05 DIAGNOSIS — J3089 Other allergic rhinitis: Secondary | ICD-10-CM | POA: Diagnosis not present

## 2020-10-05 DIAGNOSIS — R531 Weakness: Secondary | ICD-10-CM | POA: Diagnosis not present

## 2020-10-12 DIAGNOSIS — J301 Allergic rhinitis due to pollen: Secondary | ICD-10-CM | POA: Diagnosis not present

## 2020-10-12 DIAGNOSIS — G4733 Obstructive sleep apnea (adult) (pediatric): Secondary | ICD-10-CM | POA: Diagnosis not present

## 2020-10-18 ENCOUNTER — Other Ambulatory Visit (INDEPENDENT_AMBULATORY_CARE_PROVIDER_SITE_OTHER): Payer: Self-pay | Admitting: Bariatrics

## 2020-10-18 DIAGNOSIS — E559 Vitamin D deficiency, unspecified: Secondary | ICD-10-CM

## 2020-10-19 NOTE — Telephone Encounter (Signed)
This patient was last seen by Dr. Manson Passey, and currently has an upcoming appt scheduled on 10/20/20 with her.

## 2020-10-20 ENCOUNTER — Other Ambulatory Visit: Payer: Self-pay

## 2020-10-20 ENCOUNTER — Ambulatory Visit (INDEPENDENT_AMBULATORY_CARE_PROVIDER_SITE_OTHER): Payer: Medicare PPO | Admitting: Bariatrics

## 2020-10-20 ENCOUNTER — Encounter (INDEPENDENT_AMBULATORY_CARE_PROVIDER_SITE_OTHER): Payer: Self-pay | Admitting: Bariatrics

## 2020-10-20 VITALS — BP 135/86 | Temp 98.3°F | Ht 70.0 in | Wt 233.0 lb

## 2020-10-20 DIAGNOSIS — I10 Essential (primary) hypertension: Secondary | ICD-10-CM

## 2020-10-20 DIAGNOSIS — Z6833 Body mass index (BMI) 33.0-33.9, adult: Secondary | ICD-10-CM

## 2020-10-20 DIAGNOSIS — Z0000 Encounter for general adult medical examination without abnormal findings: Secondary | ICD-10-CM | POA: Diagnosis not present

## 2020-10-20 DIAGNOSIS — N400 Enlarged prostate without lower urinary tract symptoms: Secondary | ICD-10-CM | POA: Diagnosis not present

## 2020-10-20 DIAGNOSIS — E6609 Other obesity due to excess calories: Secondary | ICD-10-CM

## 2020-10-20 DIAGNOSIS — Z6834 Body mass index (BMI) 34.0-34.9, adult: Secondary | ICD-10-CM | POA: Diagnosis not present

## 2020-10-20 DIAGNOSIS — J301 Allergic rhinitis due to pollen: Secondary | ICD-10-CM | POA: Diagnosis not present

## 2020-10-20 DIAGNOSIS — I700 Atherosclerosis of aorta: Secondary | ICD-10-CM | POA: Diagnosis not present

## 2020-10-20 DIAGNOSIS — F5101 Primary insomnia: Secondary | ICD-10-CM | POA: Diagnosis not present

## 2020-10-20 DIAGNOSIS — Z23 Encounter for immunization: Secondary | ICD-10-CM | POA: Diagnosis not present

## 2020-10-20 DIAGNOSIS — E1169 Type 2 diabetes mellitus with other specified complication: Secondary | ICD-10-CM

## 2020-10-20 DIAGNOSIS — Z1389 Encounter for screening for other disorder: Secondary | ICD-10-CM | POA: Diagnosis not present

## 2020-10-20 DIAGNOSIS — K219 Gastro-esophageal reflux disease without esophagitis: Secondary | ICD-10-CM | POA: Diagnosis not present

## 2020-10-20 DIAGNOSIS — E669 Obesity, unspecified: Secondary | ICD-10-CM | POA: Diagnosis not present

## 2020-10-20 DIAGNOSIS — E785 Hyperlipidemia, unspecified: Secondary | ICD-10-CM | POA: Diagnosis not present

## 2020-10-20 NOTE — Progress Notes (Signed)
Chief Complaint:   OBESITY JOH RAO is here to discuss his progress with his obesity treatment plan along with follow-up of his obesity related diagnoses. Marc Gates is keeping a food journal and adhering to recommended goals of 1700-1800 calories and 100+ grams of protein and states he is following his eating plan approximately 80% of the time. Marc Gates states he is walking 4-5 miles 4-5 times per week.  Today's visit was #: 8 Starting weight: 263 lbs Starting date: 05/27/2020 Today's weight: 233 lbs Today's date: 10/20/2020 Total lbs lost to date: 30 Total lbs lost since last in-office visit: 3  Interim History: Marc Gates is down an additional 3 lbs and doing well overall.  Subjective:   Essential hypertension. Marc Gates is taking Exforge. Blood pressure is reasonably well controlled.  BP Readings from Last 3 Encounters:  10/20/20 135/86  09/29/20 132/84  08/27/20 (!) 148/84   Lab Results  Component Value Date   CREATININE 0.91 07/20/2020   CREATININE 0.86 04/05/2019   CREATININE 0.83 04/04/2019   Type 2 diabetes mellitus with other specified complication, without long-term current use of insulin (HCC). Marc Gates is taking Dulaglutide and Glucophage. He had been on glipizide, but stopped due to risk of hypoglycemia. A1c 6.2 on 07/20/2020.   Lab Results  Component Value Date   HGBA1C 6.2 (H) 07/20/2020   HGBA1C 7.5 (H) 04/03/2019   Lab Results  Component Value Date   LDLCALC 30 05/27/2020   CREATININE 0.91 07/20/2020   Lab Results  Component Value Date   INSULIN 40.2 (H) 07/20/2020   Assessment/Plan:   Essential hypertension. Marc Gates is working on healthy weight loss and exercise to improve blood pressure control. We will watch for signs of hypotension as he continues his lifestyle modifications. He will continue his medication as directed.   Type 2 diabetes mellitus with other specified complication, without long-term current use of insulin (HCC). Good blood sugar control  is important to decrease the likelihood of diabetic complications such as nephropathy, neuropathy, limb loss, blindness, coronary artery disease, and death. Intensive lifestyle modification including diet, exercise and weight loss are the first line of treatment for diabetes. Marc Gates will continue his medications as directed.   Class 1 obesity due to excess calories with serious comorbidity and body mass index (BMI) of 33.0 to 33.9 in adult.  Marc Gates is currently in the action stage of change. As such, his goal is to continue with weight loss efforts. He has agreed to keeping a food journal and adhering to recommended goals of 1700-1800 calories and 100+ grams of protein.   He will work on meal planning and continuing to adhere closely to the plan.  Exercise goals: Marc Gates will continue walking 3 days a week and will consider recumbent bike.  Behavioral modification strategies: increasing lean protein intake, decreasing simple carbohydrates, increasing vegetables, increasing water intake, decreasing eating out, no skipping meals, meal planning and cooking strategies, keeping healthy foods in the home and planning for success.  Marc Gates has agreed to follow-up with our clinic in 3 weeks. He was informed of the importance of frequent follow-up visits to maximize his success with intensive lifestyle modifications for his multiple health conditions.   Objective:   Blood pressure 135/86, temperature 98.3 F (36.8 C), height 5\' 10"  (1.778 m), weight 233 lb (105.7 kg). Body mass index is 33.43 kg/m.  General: Cooperative, alert, well developed, in no acute distress. HEENT: Conjunctivae and lids unremarkable. Cardiovascular: Regular rhythm.  Lungs: Normal work of breathing. Neurologic: No  focal deficits.   Lab Results  Component Value Date   CREATININE 0.91 07/20/2020   BUN 15 07/20/2020   NA 139 07/20/2020   K 4.4 07/20/2020   CL 102 07/20/2020   CO2 20 07/20/2020   Lab Results  Component Value  Date   ALT 18 07/20/2020   AST 17 07/20/2020   ALKPHOS 63 07/20/2020   BILITOT 0.4 07/20/2020   Lab Results  Component Value Date   HGBA1C 6.2 (H) 07/20/2020   HGBA1C 7.5 (H) 04/03/2019   Lab Results  Component Value Date   INSULIN 40.2 (H) 07/20/2020   Lab Results  Component Value Date   TSH 1.260 05/27/2020   Lab Results  Component Value Date   CHOL 102 05/27/2020   HDL 47 05/27/2020   LDLCALC 30 05/27/2020   TRIG 146 05/27/2020   Lab Results  Component Value Date   WBC 15.3 (H) 12/31/2019   HGB 15.7 12/31/2019   HCT 47.4 12/31/2019   MCV 92.9 12/31/2019   PLT 256.0 12/31/2019   No results found for: IRON, TIBC, FERRITIN  Obesity Behavioral Intervention:   Approximately 15 minutes were spent on the discussion below.  ASK: We discussed the diagnosis of obesity with Marc Gates today and Marc Gates agreed to give [name] permission to discuss obesity behavioral modification therapy today.  ASSESS: Marc Gates has the diagnosis of obesity and his BMI today is 33.4. Marc Gates is in the action stage of change.   ADVISE: Marc Gates was educated on the multiple health risks of obesity as well as the benefit of weight loss to improve his health. He was advised of the need for long term treatment and the importance of lifestyle modifications to improve his current health and to decrease his risk of future health problems.  AGREE: Multiple dietary modification options and treatment options were discussed and Marc Gates agreed to follow the recommendations documented in the above note.  ARRANGE: Marc Gates was educated on the importance of frequent visits to treat obesity as outlined per CMS and USPSTF guidelines and agreed to schedule his next follow up appointment today.  Attestation Statements:   Reviewed by clinician on day of visit: allergies, medications, problem list, medical history, surgical history, family history, social history, and previous encounter notes.  [name], am acting as  Energy manager for [employer], DO   I have reviewed the above documentation for accuracy and completeness, and I agree with the above. [name], DO

## 2020-10-26 DIAGNOSIS — E1351 Other specified diabetes mellitus with diabetic peripheral angiopathy without gangrene: Secondary | ICD-10-CM | POA: Diagnosis not present

## 2020-10-26 DIAGNOSIS — L602 Onychogryphosis: Secondary | ICD-10-CM | POA: Diagnosis not present

## 2020-10-26 DIAGNOSIS — J301 Allergic rhinitis due to pollen: Secondary | ICD-10-CM | POA: Diagnosis not present

## 2020-10-26 DIAGNOSIS — L84 Corns and callosities: Secondary | ICD-10-CM | POA: Diagnosis not present

## 2020-11-03 DIAGNOSIS — J301 Allergic rhinitis due to pollen: Secondary | ICD-10-CM | POA: Diagnosis not present

## 2020-11-05 DIAGNOSIS — J301 Allergic rhinitis due to pollen: Secondary | ICD-10-CM | POA: Diagnosis not present

## 2020-11-06 DIAGNOSIS — Z20822 Contact with and (suspected) exposure to covid-19: Secondary | ICD-10-CM | POA: Diagnosis not present

## 2020-11-09 DIAGNOSIS — J3089 Other allergic rhinitis: Secondary | ICD-10-CM | POA: Diagnosis not present

## 2020-11-09 DIAGNOSIS — J301 Allergic rhinitis due to pollen: Secondary | ICD-10-CM | POA: Diagnosis not present

## 2020-11-11 ENCOUNTER — Other Ambulatory Visit: Payer: Self-pay

## 2020-11-11 ENCOUNTER — Encounter (INDEPENDENT_AMBULATORY_CARE_PROVIDER_SITE_OTHER): Payer: Self-pay | Admitting: Bariatrics

## 2020-11-11 ENCOUNTER — Ambulatory Visit (INDEPENDENT_AMBULATORY_CARE_PROVIDER_SITE_OTHER): Payer: Medicare PPO | Admitting: Bariatrics

## 2020-11-11 VITALS — BP 122/85 | HR 79 | Temp 97.6°F | Ht 70.0 in | Wt 234.0 lb

## 2020-11-11 DIAGNOSIS — I152 Hypertension secondary to endocrine disorders: Secondary | ICD-10-CM | POA: Diagnosis not present

## 2020-11-11 DIAGNOSIS — Z6833 Body mass index (BMI) 33.0-33.9, adult: Secondary | ICD-10-CM

## 2020-11-11 DIAGNOSIS — E6609 Other obesity due to excess calories: Secondary | ICD-10-CM | POA: Diagnosis not present

## 2020-11-11 DIAGNOSIS — E1159 Type 2 diabetes mellitus with other circulatory complications: Secondary | ICD-10-CM

## 2020-11-11 DIAGNOSIS — E559 Vitamin D deficiency, unspecified: Secondary | ICD-10-CM

## 2020-11-11 MED ORDER — VITAMIN D (ERGOCALCIFEROL) 1.25 MG (50000 UNIT) PO CAPS: 50000.0000 [IU] | ORAL_CAPSULE | ORAL | 0 refills | Status: DC

## 2020-11-12 ENCOUNTER — Encounter (INDEPENDENT_AMBULATORY_CARE_PROVIDER_SITE_OTHER): Payer: Self-pay | Admitting: Bariatrics

## 2020-11-12 DIAGNOSIS — G4733 Obstructive sleep apnea (adult) (pediatric): Secondary | ICD-10-CM | POA: Diagnosis not present

## 2020-11-12 DIAGNOSIS — Z20822 Contact with and (suspected) exposure to covid-19: Secondary | ICD-10-CM | POA: Diagnosis not present

## 2020-11-12 NOTE — Progress Notes (Signed)
Chief Complaint:   OBESITY Marc Gates is here to discuss his progress with his obesity treatment plan along with follow-up of his obesity related diagnoses. General is on keeping a food journal and adhering to recommended goals of 1700-1800 calories and 100+ grams of protein and states he is following his eating plan approximately 75% of the time. Javonta states he is walking 4,000 steps 4-5 times per week.  Today's visit was #: 9 Starting weight: 263 lbs Starting date: 05/27/2020 Today's weight: 234 lbs Today's date: 11/11/2020 Total lbs lost to date: 29 lbs Total lbs lost since last in-office visit: 0  Interim History: Kalijah is up 1 pound over the holidays, but has done well overall.  He has not been eating enough protein.  Subjective:   1. Vitamin D deficiency Jayr's Vitamin D level was 40.0 on 05/27/2020. He is currently taking prescription vitamin D 50,000 IU each week. He denies nausea, vomiting or muscle weakness.  2. Hypertension associated with type 2 diabetes mellitus (HCC) Review: taking medications as instructed, no medication side effects noted, no chest pain on exertion, no dyspnea on exertion, no swelling of ankles.  He is taking dulaglutide and metformin.  BP Readings from Last 3 Encounters:  11/11/20 122/85  10/20/20 135/86  09/29/20 132/84   Assessment/Plan:   1. Vitamin D deficiency Low Vitamin D level contributes to fatigue and are associated with obesity, breast, and colon cancer. He agrees to continue to take prescription Vitamin D @50 ,000 IU every week and will follow-up for routine testing of Vitamin D, at least 2-3 times per year to avoid over-replacement.  -Refill Vitamin D, Ergocalciferol, (DRISDOL) 1.25 MG (50000 UNIT) CAPS capsule; Take 1 capsule (50,000 Units total) by mouth every 14 (fourteen) days.  Dispense: 2 capsule; Refill: 0  2. Hypertension associated with type 2 diabetes mellitus (HCC) Lucille is working on healthy weight loss and exercise to  improve blood pressure control. We will watch for signs of hypotension as he continues his lifestyle modifications.  Continue medications.  3. Class 1 obesity due to excess calories with serious comorbidity and body mass index (BMI) of 33.0 to 33.9 in adult  Gamal is currently in the action stage of change. As such, his goal is to continue with weight loss efforts. He has agreed to keeping a food journal and adhering to recommended goals of 1700-1800 calories and 100+ grams of protein.   He will work on meal planning and mindful eating.  Exercise goals: Will walk more.  Behavioral modification strategies: increasing lean protein intake, decreasing simple carbohydrates, increasing vegetables, increasing water intake, decreasing eating out, no skipping meals, meal planning and cooking strategies, keeping healthy foods in the home, ways to avoid boredom eating, ways to avoid night time snacking, better snacking choices and emotional eating strategies.  Thijs has agreed to follow-up with our clinic in 2-3 weeks. He was informed of the importance of frequent follow-up visits to maximize his success with intensive lifestyle modifications for his multiple health conditions.   Objective:   Blood pressure 122/85, pulse 79, temperature 97.6 F (36.4 C), temperature source Oral, height 5\' 10"  (1.778 m), weight 234 lb (106.1 kg), SpO2 96 %. Body mass index is 33.58 kg/m.  General: Cooperative, alert, well developed, in no acute distress. HEENT: Conjunctivae and lids unremarkable. Cardiovascular: Regular rhythm.  Lungs: Normal work of breathing. Neurologic: No focal deficits.   Lab Results  Component Value Date   CREATININE 0.91 07/20/2020   BUN 15 07/20/2020  NA 139 07/20/2020   K 4.4 07/20/2020   CL 102 07/20/2020   CO2 20 07/20/2020   Lab Results  Component Value Date   ALT 18 07/20/2020   AST 17 07/20/2020   ALKPHOS 63 07/20/2020   BILITOT 0.4 07/20/2020   Lab Results  Component  Value Date   HGBA1C 6.2 (H) 07/20/2020   HGBA1C 7.5 (H) 04/03/2019   Lab Results  Component Value Date   INSULIN 40.2 (H) 07/20/2020   Lab Results  Component Value Date   TSH 1.260 05/27/2020   Lab Results  Component Value Date   CHOL 102 05/27/2020   HDL 47 05/27/2020   LDLCALC 30 05/27/2020   TRIG 146 05/27/2020   Lab Results  Component Value Date   WBC 15.3 (H) 12/31/2019   HGB 15.7 12/31/2019   HCT 47.4 12/31/2019   MCV 92.9 12/31/2019   PLT 256.0 12/31/2019   Obesity Behavioral Intervention:   Approximately 15 minutes were spent on the discussion below.  ASK: We discussed the diagnosis of obesity with [name] today and Mikai agreed to give [name] permission to discuss obesity behavioral modification therapy today.  ASSESS: Billy has the diagnosis of obesity and his BMI today is 33.6. Donshay is in the action stage of change.   ADVISE: Corbin was educated on the multiple health risks of obesity as well as the benefit of weight loss to improve his health. He was advised of the need for long term treatment and the importance of lifestyle modifications to improve his current health and to decrease his risk of future health problems.  AGREE: Multiple dietary modification options and treatment options were discussed and Lavalle agreed to follow the recommendations documented in the above note.  ARRANGE: Woodley was educated on the importance of frequent visits to treat obesity as outlined per CMS and USPSTF guidelines and agreed to schedule his next follow up appointment today.  Attestation Statements:   Reviewed by clinician on day of visit: allergies, medications, problem list, medical history, surgical history, family history, social history, and previous encounter notes.  I, Insurance claims handler, CMA, am acting as Energy manager for [employer], DO  I have reviewed the above documentation for accuracy and completeness, and I agree with the above. [name], DO

## 2020-11-16 DIAGNOSIS — J301 Allergic rhinitis due to pollen: Secondary | ICD-10-CM | POA: Diagnosis not present

## 2020-11-17 ENCOUNTER — Ambulatory Visit
Admission: RE | Admit: 2020-11-17 | Discharge: 2020-11-17 | Disposition: A | Discharge status: Home / Self Care | Payer: Medicare PPO | Source: Ambulatory Visit | Attending: Internal Medicine | Admitting: Internal Medicine

## 2020-11-17 ENCOUNTER — Other Ambulatory Visit: Payer: Self-pay | Admitting: Internal Medicine

## 2020-11-17 DIAGNOSIS — M79622 Pain in left upper arm: Secondary | ICD-10-CM

## 2020-11-17 DIAGNOSIS — M19012 Primary osteoarthritis, left shoulder: Secondary | ICD-10-CM | POA: Diagnosis not present

## 2020-11-19 DIAGNOSIS — G4733 Obstructive sleep apnea (adult) (pediatric): Secondary | ICD-10-CM | POA: Diagnosis not present

## 2020-11-21 DIAGNOSIS — Z20822 Contact with and (suspected) exposure to covid-19: Secondary | ICD-10-CM | POA: Diagnosis not present

## 2020-11-24 DIAGNOSIS — J301 Allergic rhinitis due to pollen: Secondary | ICD-10-CM | POA: Diagnosis not present

## 2020-11-30 DIAGNOSIS — J301 Allergic rhinitis due to pollen: Secondary | ICD-10-CM | POA: Diagnosis not present

## 2020-12-02 ENCOUNTER — Encounter (INDEPENDENT_AMBULATORY_CARE_PROVIDER_SITE_OTHER): Payer: Self-pay | Admitting: Bariatrics

## 2020-12-02 ENCOUNTER — Other Ambulatory Visit: Payer: Self-pay

## 2020-12-02 ENCOUNTER — Ambulatory Visit (INDEPENDENT_AMBULATORY_CARE_PROVIDER_SITE_OTHER): Payer: Medicare PPO | Admitting: Bariatrics

## 2020-12-02 VITALS — BP 135/82 | HR 80 | Temp 97.8°F | Ht 70.0 in | Wt 231.0 lb

## 2020-12-02 DIAGNOSIS — M25512 Pain in left shoulder: Secondary | ICD-10-CM | POA: Diagnosis not present

## 2020-12-02 DIAGNOSIS — M67912 Unspecified disorder of synovium and tendon, left shoulder: Secondary | ICD-10-CM | POA: Diagnosis not present

## 2020-12-02 DIAGNOSIS — E669 Obesity, unspecified: Secondary | ICD-10-CM | POA: Diagnosis not present

## 2020-12-02 DIAGNOSIS — Z6833 Body mass index (BMI) 33.0-33.9, adult: Secondary | ICD-10-CM

## 2020-12-02 DIAGNOSIS — E559 Vitamin D deficiency, unspecified: Secondary | ICD-10-CM

## 2020-12-02 DIAGNOSIS — E7849 Other hyperlipidemia: Secondary | ICD-10-CM

## 2020-12-02 DIAGNOSIS — M19012 Primary osteoarthritis, left shoulder: Secondary | ICD-10-CM | POA: Diagnosis not present

## 2020-12-02 MED ORDER — VITAMIN D (ERGOCALCIFEROL) 1.25 MG (50000 UNIT) PO CAPS
50000.0000 [IU] | ORAL_CAPSULE | ORAL | 0 refills | Status: DC
Start: 2020-12-02 — End: 2020-12-24

## 2020-12-03 ENCOUNTER — Encounter (INDEPENDENT_AMBULATORY_CARE_PROVIDER_SITE_OTHER): Payer: Self-pay | Admitting: Bariatrics

## 2020-12-03 DIAGNOSIS — Z20822 Contact with and (suspected) exposure to covid-19: Secondary | ICD-10-CM | POA: Diagnosis not present

## 2020-12-03 NOTE — Progress Notes (Signed)
Chief Complaint:   OBESITY Tirso is here to discuss his progress with his obesity treatment plan along with follow-up of his obesity related diagnoses. Feliberto is on keeping a food journal and adhering to recommended goals of 1700-1800 calories and 100+ grams of protein and states he is following his eating plan approximately 70% of the time. Matheu states he is walking for 4,000 steps per day 7 days per week.  Today's visit was #: 10 Starting weight: 263 lbs Starting date: 05/27/2020 Today's weight: 231 lbs Today's date: 12/03/2019 Total lbs lost to date: 32 lbs Total lbs lost since last in-office visit: 3 lbs  Interim History: Laterrance is down an additional 3 pounds and doing well overall.   Subjective:   1. Vitamin D deficiency Regie's Vitamin D level was 40.0 on 05/27/2020. He is currently taking prescription vitamin D 50,000 IU every 2 weeks. He denies nausea, vomiting or muscle weakness.  2. Other hyperlipidemia Ulric has hyperlipidemia and has been trying to improve his cholesterol levels with intensive lifestyle modification including a low saturated fat diet, exercise and weight loss. He denies any chest pain, claudication or myalgias.  He is taking Lipitor 20 mg daily.  He denies side effects.  Lab Results  Component Value Date   ALT 18 07/20/2020   AST 17 07/20/2020   ALKPHOS 63 07/20/2020   BILITOT 0.4 07/20/2020   Lab Results  Component Value Date   CHOL 102 05/27/2020   HDL 47 05/27/2020   LDLCALC 30 05/27/2020   TRIG 146 05/27/2020   Assessment/Plan:   1. Vitamin D deficiency Low Vitamin D level contributes to fatigue and are associated with obesity, breast, and colon cancer. He agrees to continue to take prescription Vitamin D @50 ,000 IU every two weeks and will follow-up for routine testing of Vitamin D, at least 2-3 times per year to avoid over-replacement.  -Refill Vitamin D, Ergocalciferol, (DRISDOL) 1.25 MG (50000 UNIT) CAPS capsule; Take 1 capsule  (50,000 Units total) by mouth every 14 (fourteen) days.  Dispense: 2 capsule; Refill: 0  2. Other hyperlipidemia Cardiovascular risk and specific lipid/LDL goals reviewed.  We discussed several lifestyle modifications today and Morty will continue to work on diet, exercise and weight loss efforts. Orders and follow up as documented in patient record.  Continue Lipitor.  Counseling Intensive lifestyle modifications are the first line treatment for this issue. . Dietary changes: Increase soluble fiber. Decrease simple carbohydrates. . Exercise changes: Moderate to vigorous-intensity aerobic activity 150 minutes per week if tolerated. . Lipid-lowering medications: see documented in medical record.  3. Class 1 obesity with serious comorbidity and body mass index (BMI) of 33.0 to 33.9 in adult, unspecified obesity type  Keldan is currently in the action stage of change. As such, his goal is to continue with weight loss efforts. He has agreed to keeping a food journal and adhering to recommended goals of 1700-1800 calories and 100+ grams of protein.   He will work on meal planning and increasing his protein and water intake.  Exercise goals: All adults should avoid inactivity. Some physical activity is better than none, and adults who participate in any amount of physical activity gain some health benefits. Continue walking and increase over time.  Behavioral modification strategies: increasing lean protein intake, decreasing simple carbohydrates, increasing vegetables, increasing water intake, decreasing eating out, no skipping meals, meal planning and cooking strategies, keeping healthy foods in the home, ways to avoid boredom eating, ways to avoid night time snacking,  better snacking choices, emotional eating strategies and planning for success.  Aodhan has agreed to follow-up with our clinic in 2-3 weeks, fasting. He was informed of the importance of frequent follow-up visits to maximize his success  with intensive lifestyle modifications for his multiple health conditions.   Objective:   Blood pressure 135/82, pulse 80, temperature 97.8 F (36.6 C), height 5\' 10"  (1.778 m), weight 231 lb (104.8 kg), SpO2 96 %. Body mass index is 33.15 kg/m.  General: Cooperative, alert, well developed, in no acute distress. HEENT: Conjunctivae and lids unremarkable. Cardiovascular: Regular rhythm.  Lungs: Normal work of breathing. Neurologic: No focal deficits.   Lab Results  Component Value Date   CREATININE 0.91 07/20/2020   BUN 15 07/20/2020   NA 139 07/20/2020   K 4.4 07/20/2020   CL 102 07/20/2020   CO2 20 07/20/2020   Lab Results  Component Value Date   ALT 18 07/20/2020   AST 17 07/20/2020   ALKPHOS 63 07/20/2020   BILITOT 0.4 07/20/2020   Lab Results  Component Value Date   HGBA1C 6.2 (H) 07/20/2020   HGBA1C 7.5 (H) 04/03/2019   Lab Results  Component Value Date   INSULIN 40.2 (H) 07/20/2020   Lab Results  Component Value Date   TSH 1.260 05/27/2020   Lab Results  Component Value Date   CHOL 102 05/27/2020   HDL 47 05/27/2020   LDLCALC 30 05/27/2020   TRIG 146 05/27/2020   Lab Results  Component Value Date   WBC 15.3 (H) 12/31/2019   HGB 15.7 12/31/2019   HCT 47.4 12/31/2019   MCV 92.9 12/31/2019   PLT 256.0 12/31/2019   Obesity Behavioral Intervention:   Approximately 15 minutes were spent on the discussion below.  ASK: We discussed the diagnosis of obesity with 01/02/2020 today and Torrin agreed to give [name] permission to discuss obesity behavioral modification therapy today.  ASSESS: Xzavier has the diagnosis of obesity and his BMI today is 33.2. Tai is in the action stage of change.   ADVISE: Gates was educated on the multiple health risks of obesity as well as the benefit of weight loss to improve his health. He was advised of the need for long term treatment and the importance of lifestyle modifications to improve his current health and to decrease his  risk of future health problems.  AGREE: Multiple dietary modification options and treatment options were discussed and Darry agreed to follow the recommendations documented in the above note.  ARRANGE: Jibreel was educated on the importance of frequent visits to treat obesity as outlined per CMS and USPSTF guidelines and agreed to schedule his next follow up appointment today.  Attestation Statements:   Reviewed by clinician on day of visit: allergies, medications, problem list, medical history, surgical history, family history, social history, and previous encounter notes.  I, [name], CMA, am acting as Insurance claims handler for [employer], DO  I have reviewed the above documentation for accuracy and completeness, and I agree with the above. [employer], DO

## 2020-12-07 DIAGNOSIS — J301 Allergic rhinitis due to pollen: Secondary | ICD-10-CM | POA: Diagnosis not present

## 2020-12-07 DIAGNOSIS — J3089 Other allergic rhinitis: Secondary | ICD-10-CM | POA: Diagnosis not present

## 2020-12-08 DIAGNOSIS — M19012 Primary osteoarthritis, left shoulder: Secondary | ICD-10-CM | POA: Diagnosis not present

## 2020-12-09 DIAGNOSIS — M19012 Primary osteoarthritis, left shoulder: Secondary | ICD-10-CM | POA: Diagnosis not present

## 2020-12-09 DIAGNOSIS — Z20822 Contact with and (suspected) exposure to covid-19: Secondary | ICD-10-CM | POA: Diagnosis not present

## 2020-12-13 DIAGNOSIS — G4733 Obstructive sleep apnea (adult) (pediatric): Secondary | ICD-10-CM | POA: Diagnosis not present

## 2020-12-14 DIAGNOSIS — M19012 Primary osteoarthritis, left shoulder: Secondary | ICD-10-CM | POA: Diagnosis not present

## 2020-12-14 DIAGNOSIS — J301 Allergic rhinitis due to pollen: Secondary | ICD-10-CM | POA: Diagnosis not present

## 2020-12-17 DIAGNOSIS — M19012 Primary osteoarthritis, left shoulder: Secondary | ICD-10-CM | POA: Diagnosis not present

## 2020-12-18 DIAGNOSIS — Z20822 Contact with and (suspected) exposure to covid-19: Secondary | ICD-10-CM | POA: Diagnosis not present

## 2020-12-21 DIAGNOSIS — M19012 Primary osteoarthritis, left shoulder: Secondary | ICD-10-CM | POA: Diagnosis not present

## 2020-12-21 DIAGNOSIS — Z872 Personal history of diseases of the skin and subcutaneous tissue: Secondary | ICD-10-CM | POA: Diagnosis not present

## 2020-12-21 DIAGNOSIS — J301 Allergic rhinitis due to pollen: Secondary | ICD-10-CM | POA: Diagnosis not present

## 2020-12-21 DIAGNOSIS — J3089 Other allergic rhinitis: Secondary | ICD-10-CM | POA: Diagnosis not present

## 2020-12-22 DIAGNOSIS — M19012 Primary osteoarthritis, left shoulder: Secondary | ICD-10-CM | POA: Diagnosis not present

## 2020-12-24 ENCOUNTER — Ambulatory Visit (INDEPENDENT_AMBULATORY_CARE_PROVIDER_SITE_OTHER): Payer: Medicare PPO | Admitting: Bariatrics

## 2020-12-24 ENCOUNTER — Other Ambulatory Visit: Payer: Self-pay

## 2020-12-24 ENCOUNTER — Encounter (INDEPENDENT_AMBULATORY_CARE_PROVIDER_SITE_OTHER): Payer: Self-pay | Admitting: Bariatrics

## 2020-12-24 VITALS — BP 105/68 | HR 64 | Temp 97.8°F | Ht 70.0 in | Wt 228.0 lb

## 2020-12-24 DIAGNOSIS — E669 Obesity, unspecified: Secondary | ICD-10-CM | POA: Diagnosis not present

## 2020-12-24 DIAGNOSIS — E559 Vitamin D deficiency, unspecified: Secondary | ICD-10-CM

## 2020-12-24 DIAGNOSIS — Z6833 Body mass index (BMI) 33.0-33.9, adult: Secondary | ICD-10-CM | POA: Diagnosis not present

## 2020-12-24 DIAGNOSIS — I152 Hypertension secondary to endocrine disorders: Secondary | ICD-10-CM | POA: Diagnosis not present

## 2020-12-24 DIAGNOSIS — E1159 Type 2 diabetes mellitus with other circulatory complications: Secondary | ICD-10-CM | POA: Diagnosis not present

## 2020-12-24 DIAGNOSIS — E6609 Other obesity due to excess calories: Secondary | ICD-10-CM

## 2020-12-24 DIAGNOSIS — E1169 Type 2 diabetes mellitus with other specified complication: Secondary | ICD-10-CM | POA: Diagnosis not present

## 2020-12-24 MED ORDER — VITAMIN D (ERGOCALCIFEROL) 1.25 MG (50000 UNIT) PO CAPS: 50000.0000 [IU] | ORAL_CAPSULE | ORAL | 0 refills | Status: DC

## 2020-12-25 DIAGNOSIS — Z20822 Contact with and (suspected) exposure to covid-19: Secondary | ICD-10-CM | POA: Diagnosis not present

## 2020-12-25 DIAGNOSIS — M19012 Primary osteoarthritis, left shoulder: Secondary | ICD-10-CM | POA: Diagnosis not present

## 2020-12-25 LAB — COMPREHENSIVE METABOLIC PANEL
ALT: 22 IU/L (ref 0–44)
AST: 22 IU/L (ref 0–40)
Albumin/Globulin Ratio: 2.6 — ABNORMAL HIGH (ref 1.2–2.2)
Albumin: 4.9 g/dL — ABNORMAL HIGH (ref 3.8–4.8)
Alkaline Phosphatase: 90 IU/L (ref 44–121)
BUN/Creatinine Ratio: 20 (ref 10–24)
BUN: 18 mg/dL (ref 8–27)
Bilirubin Total: 0.4 mg/dL (ref 0.0–1.2)
CO2: 17 mmol/L — ABNORMAL LOW (ref 20–29)
Calcium: 9.4 mg/dL (ref 8.6–10.2)
Chloride: 105 mmol/L (ref 96–106)
Creatinine, Ser: 0.92 mg/dL (ref 0.76–1.27)
GFR calc Af Amer: 98 mL/min/{1.73_m2} (ref >59)
GFR calc non Af Amer: 85 mL/min/{1.73_m2} (ref >59)
Globulin, Total: 1.9 g/dL (ref 1.5–4.5)
Glucose: 123 mg/dL — ABNORMAL HIGH (ref 65–99)
Potassium: 4.6 mmol/L (ref 3.5–5.2)
Sodium: 140 mmol/L (ref 134–144)
Total Protein: 6.8 g/dL (ref 6.0–8.5)

## 2020-12-25 LAB — HEMOGLOBIN A1C
Est. average glucose Bld gHb Est-mCnc: 123 mg/dL
Hgb A1c MFr Bld: 5.9 % — ABNORMAL HIGH (ref 4.8–5.6)

## 2020-12-25 LAB — VITAMIN D 25 HYDROXY (VIT D DEFICIENCY, FRACTURES): Vit D, 25-Hydroxy: 60.3 ng/mL (ref 30.0–100.0)

## 2020-12-25 LAB — INSULIN, RANDOM: INSULIN: 12 u[IU]/mL (ref 2.6–24.9)

## 2020-12-28 DIAGNOSIS — J301 Allergic rhinitis due to pollen: Secondary | ICD-10-CM | POA: Diagnosis not present

## 2020-12-28 NOTE — Progress Notes (Signed)
Chief Complaint:   OBESITY Marc Gates is here to discuss his progress with his obesity treatment plan along with follow-up of his obesity related diagnoses. Marc Gates is on keeping a food journal and adhering to recommended goals of 1500 calories and 100+ g protein and states he is following his eating plan approximately 70% of the time. Marc Gates states he is walking 5 miles 2-3 times per week.  Today's visit was #: 11 Starting weight: 263 lbs Starting date: 05/27/2020 Today's weight: 228 lbs Today's date: 12/24/2020 Total lbs lost to date: 35 lbs Total lbs lost since last in-office visit: 3 lbs  Interim History: Marc Gates is down 3 lbs from his last OV and is doing well overall. He is making good choices.   Subjective:   1. Diabetes mellitus type 2 in obese (HCC) Marc Gates is taking Metformin and dulaglutide 1.5 mg. His diabetes  Is well controlled.   2. Vitamin D deficiency Marc Gates's Vitamin D level was 40.0 on 05/27/2020. He is currently taking prescription vitamin D 50,000 IU every 14 days. He denies nausea, vomiting or muscle weakness.   Ref. Range 05/27/2020 13:26  Vitamin D, 25-Hydroxy Latest Ref Range: 30.0 - 100.0 ng/mL 40.0    3. Hypertension associated with diabetes (HCC) Marc Gates's BP is controlled on his current anti-hypertensive regimen.  BP Readings from Last 3 Encounters:  12/24/20 105/68  12/02/20 135/82  11/11/20 122/85    Assessment/Plan:   1. Diabetes mellitus type 2 in obese (HCC) Good blood sugar control is important to decrease the likelihood of diabetic complications such as nephropathy, neuropathy, limb loss, blindness, coronary artery disease, and death. Intensive lifestyle modification including diet, exercise and weight loss are the first line of treatment for diabetes. Check labs today.  - Insulin, random - Hemoglobin A1c  2. Vitamin D deficiency Low Vitamin D level contributes to fatigue and are associated with obesity, breast, and colon cancer. He agrees to  continue to take prescription Vitamin D @50 ,000 IU every 14 days and will follow-up for routine testing of Vitamin D, at least 2-3 times per year to avoid over-replacement. Check labs today.  - Vitamin D, Ergocalciferol, (DRISDOL) 1.25 MG (50000 UNIT) CAPS capsule; Take 1 capsule (50,000 Units total) by mouth every 14 (fourteen) days.  Dispense: 2 capsule; Refill: 0  - VITAMIN D 25 Hydroxy (Vit-D Deficiency, Fractures)  3. Hypertension associated with diabetes (HCC) Marc Gates is working on healthy weight loss and exercise to improve blood pressure control. We will watch for signs of hypotension as he continues his lifestyle modifications. Check labs today.  - Comprehensive metabolic panel  4. Class 1 obesity due to excess calories without serious comorbidity with body mass index (BMI) of 32.0 to 32.9 in adult Marc Gates is currently in the action stage of change. As such, his goal is to continue with weight loss efforts. He has agreed to keeping a food journal and adhering to recommended goals of 1500 calories and 100+ g protein.   Exercise goals: Continue to walk  Behavioral modification strategies: increasing lean protein intake, decreasing simple carbohydrates, increasing vegetables, increasing water intake, decreasing eating out, no skipping meals, meal planning and cooking strategies, keeping healthy foods in the home and planning for success.  Marc Gates has agreed to follow-up with our clinic in 2-3 weeks fasting. He was informed of the importance of frequent follow-up visits to maximize his success with intensive lifestyle modifications for his multiple health conditions.   Objective:   Blood pressure 105/68, pulse 64, temperature 97.8  F (36.6 C), height 5\' 10"  (1.778 m), SpO2 97 %. Body mass index is 33.15 kg/m.  General: Cooperative, alert, well developed, in no acute distress. HEENT: Conjunctivae and lids unremarkable. Cardiovascular: Regular rhythm.  Lungs: Normal work of  breathing. Neurologic: No focal deficits.   Lab Results  Component Value Date   CREATININE 0.92 12/24/2020   BUN 18 12/24/2020   NA 140 12/24/2020   K 4.6 12/24/2020   CL 105 12/24/2020   CO2 17 (L) 12/24/2020   Lab Results  Component Value Date   ALT 22 12/24/2020   AST 22 12/24/2020   ALKPHOS 90 12/24/2020   BILITOT 0.4 12/24/2020   Lab Results  Component Value Date   HGBA1C 5.9 (H) 12/24/2020   HGBA1C 6.2 (H) 07/20/2020   HGBA1C 7.5 (H) 04/03/2019   Lab Results  Component Value Date   INSULIN 12.0 12/24/2020   INSULIN 40.2 (H) 07/20/2020   Lab Results  Component Value Date   TSH 1.260 05/27/2020   Lab Results  Component Value Date   CHOL 102 05/27/2020   HDL 47 05/27/2020   LDLCALC 30 05/27/2020   TRIG 146 05/27/2020   Lab Results  Component Value Date   WBC 15.3 (H) 12/31/2019   HGB 15.7 12/31/2019   HCT 47.4 12/31/2019   MCV 92.9 12/31/2019   PLT 256.0 12/31/2019   No results found for: IRON, TIBC, FERRITIN  Obesity Behavioral Intervention:   Approximately 15 minutes were spent on the discussion below.  ASK: We discussed the diagnosis of obesity with 01/02/2020 today and Devarious agreed to give [name] permission to discuss obesity behavioral modification therapy today.  ASSESS: Jeramine has the diagnosis of obesity and his BMI today is 32.8. Zymire is in the action stage of change.   ADVISE: Morgen was educated on the multiple health risks of obesity as well as the benefit of weight loss to improve his health. He was advised of the need for long term treatment and the importance of lifestyle modifications to improve his current health and to decrease his risk of future health problems.  AGREE: Multiple dietary modification options and treatment options were discussed and Arrow agreed to follow the recommendations documented in the above note.  ARRANGE: [place] was educated on the importance of frequent visits to treat obesity as outlined per CMS and USPSTF  guidelines and agreed to schedule his next follow up appointment today.  Attestation Statements:   Reviewed by clinician on day of visit: allergies, medications, problem list, medical history, surgical history, family history, social history, and previous encounter notes.  [name], am acting as Edmund Hilda for Energy manager, DO.  I have reviewed the above documentation for accuracy and completeness, and I agree with the above. [employer], DO

## 2020-12-29 ENCOUNTER — Encounter (INDEPENDENT_AMBULATORY_CARE_PROVIDER_SITE_OTHER): Payer: Self-pay | Admitting: Bariatrics

## 2020-12-30 DIAGNOSIS — M19012 Primary osteoarthritis, left shoulder: Secondary | ICD-10-CM | POA: Diagnosis not present

## 2021-01-01 DIAGNOSIS — Z20822 Contact with and (suspected) exposure to covid-19: Secondary | ICD-10-CM | POA: Diagnosis not present

## 2021-01-04 DIAGNOSIS — J301 Allergic rhinitis due to pollen: Secondary | ICD-10-CM | POA: Diagnosis not present

## 2021-01-04 DIAGNOSIS — J3089 Other allergic rhinitis: Secondary | ICD-10-CM | POA: Diagnosis not present

## 2021-01-09 DIAGNOSIS — Z20822 Contact with and (suspected) exposure to covid-19: Secondary | ICD-10-CM | POA: Diagnosis not present

## 2021-01-10 DIAGNOSIS — G4733 Obstructive sleep apnea (adult) (pediatric): Secondary | ICD-10-CM | POA: Diagnosis not present

## 2021-01-11 ENCOUNTER — Ambulatory Visit (INDEPENDENT_AMBULATORY_CARE_PROVIDER_SITE_OTHER)
Admission: RE | Admit: 2021-01-11 | Discharge: 2021-01-11 | Disposition: A | Discharge status: Home / Self Care | Payer: Medicare PPO | Source: Ambulatory Visit | Attending: Pulmonary Disease | Admitting: Pulmonary Disease

## 2021-01-11 ENCOUNTER — Other Ambulatory Visit: Payer: Self-pay

## 2021-01-11 ENCOUNTER — Other Ambulatory Visit: Payer: Medicare PPO

## 2021-01-11 DIAGNOSIS — R0609 Other forms of dyspnea: Secondary | ICD-10-CM | POA: Diagnosis not present

## 2021-01-11 DIAGNOSIS — J929 Pleural plaque without asbestos: Secondary | ICD-10-CM | POA: Diagnosis not present

## 2021-01-11 DIAGNOSIS — R0602 Shortness of breath: Secondary | ICD-10-CM

## 2021-01-11 DIAGNOSIS — I358 Other nonrheumatic aortic valve disorders: Secondary | ICD-10-CM | POA: Diagnosis not present

## 2021-01-11 DIAGNOSIS — I2510 Atherosclerotic heart disease of native coronary artery without angina pectoris: Secondary | ICD-10-CM | POA: Diagnosis not present

## 2021-01-11 DIAGNOSIS — J3089 Other allergic rhinitis: Secondary | ICD-10-CM | POA: Diagnosis not present

## 2021-01-11 DIAGNOSIS — J301 Allergic rhinitis due to pollen: Secondary | ICD-10-CM | POA: Diagnosis not present

## 2021-01-12 ENCOUNTER — Other Ambulatory Visit (INDEPENDENT_AMBULATORY_CARE_PROVIDER_SITE_OTHER): Payer: Self-pay | Admitting: Bariatrics

## 2021-01-12 DIAGNOSIS — E559 Vitamin D deficiency, unspecified: Secondary | ICD-10-CM

## 2021-01-13 NOTE — Telephone Encounter (Signed)
Dr.Brown 

## 2021-01-15 DIAGNOSIS — Z20822 Contact with and (suspected) exposure to covid-19: Secondary | ICD-10-CM | POA: Diagnosis not present

## 2021-01-18 ENCOUNTER — Other Ambulatory Visit: Payer: Self-pay

## 2021-01-18 ENCOUNTER — Ambulatory Visit (INDEPENDENT_AMBULATORY_CARE_PROVIDER_SITE_OTHER): Payer: Medicare PPO | Admitting: Bariatrics

## 2021-01-18 ENCOUNTER — Encounter (INDEPENDENT_AMBULATORY_CARE_PROVIDER_SITE_OTHER): Payer: Self-pay | Admitting: Bariatrics

## 2021-01-18 VITALS — BP 134/84 | HR 100 | Temp 98.0°F | Ht 70.0 in | Wt 233.0 lb

## 2021-01-18 DIAGNOSIS — E7849 Other hyperlipidemia: Secondary | ICD-10-CM | POA: Diagnosis not present

## 2021-01-18 DIAGNOSIS — E1169 Type 2 diabetes mellitus with other specified complication: Secondary | ICD-10-CM | POA: Diagnosis not present

## 2021-01-18 DIAGNOSIS — Z20822 Contact with and (suspected) exposure to covid-19: Secondary | ICD-10-CM | POA: Diagnosis not present

## 2021-01-18 DIAGNOSIS — Z6833 Body mass index (BMI) 33.0-33.9, adult: Secondary | ICD-10-CM

## 2021-01-18 DIAGNOSIS — J3089 Other allergic rhinitis: Secondary | ICD-10-CM | POA: Diagnosis not present

## 2021-01-18 DIAGNOSIS — E669 Obesity, unspecified: Secondary | ICD-10-CM | POA: Diagnosis not present

## 2021-01-18 DIAGNOSIS — E559 Vitamin D deficiency, unspecified: Secondary | ICD-10-CM | POA: Diagnosis not present

## 2021-01-18 DIAGNOSIS — E6609 Other obesity due to excess calories: Secondary | ICD-10-CM | POA: Diagnosis not present

## 2021-01-18 DIAGNOSIS — J301 Allergic rhinitis due to pollen: Secondary | ICD-10-CM | POA: Diagnosis not present

## 2021-01-18 MED ORDER — VITAMIN D (ERGOCALCIFEROL) 1.25 MG (50000 UNIT) PO CAPS
50000.0000 [IU] | ORAL_CAPSULE | ORAL | 0 refills | Status: DC
Start: 2021-01-18 — End: 2021-02-08

## 2021-01-19 ENCOUNTER — Encounter (INDEPENDENT_AMBULATORY_CARE_PROVIDER_SITE_OTHER): Payer: Self-pay | Admitting: Bariatrics

## 2021-01-19 NOTE — Progress Notes (Signed)
Chief Complaint:   OBESITY Marc Gates is here to discuss his progress with his obesity treatment plan along with follow-up of his obesity related diagnoses. Marc Gates is on keeping a food journal and adhering to recommended goals of 1800 calories and 80+ g protein and states he is following his eating plan approximately 70% of the time. Marc Gates states he is walked 10,000 steps 2 times per week.  Today's visit was #: 12 Starting weight: 263 lbs Starting date: 05/27/2020 Today's weight: 233 lbs Today's date: 01/18/2021 Total lbs lost to date: 30 lbs Total lbs lost since last in-office visit: 0  Interim History: Marc Gates is up 5 lbs since his last visit. He needs to be strict on the plan. He is doing well with his water.  Subjective:   1. Vitamin D deficiency Marc Gates reports minimal sun exposure.   2. Other hyperlipidemia Marc Gates is taking Lipitor.  Lab Results  Component Value Date   ALT 22 12/24/2020   AST 22 12/24/2020   ALKPHOS 90 12/24/2020   BILITOT 0.4 12/24/2020   Lab Results  Component Value Date   CHOL 102 05/27/2020   HDL 47 05/27/2020   LDLCALC 30 05/27/2020   TRIG 146 05/27/2020    3. Diabetes mellitus type 2 in obese (HCC) Marc Gates is taking Dulaglutide. His fasting blood sugars run 130-150.  Lab Results  Component Value Date   HGBA1C 5.9 (H) 12/24/2020   HGBA1C 6.2 (H) 07/20/2020   HGBA1C 7.5 (H) 04/03/2019   Lab Results  Component Value Date   LDLCALC 30 05/27/2020   CREATININE 0.92 12/24/2020   Lab Results  Component Value Date   INSULIN 12.0 12/24/2020   INSULIN 40.2 (H) 07/20/2020    Assessment/Plan:   1. Vitamin D deficiency Low Vitamin D level contributes to fatigue and are associated with obesity, breast, and colon cancer. He agrees to continue to take prescription Vitamin D @50 ,000 IU every week and will follow-up for routine testing of Vitamin D, at least 2-3 times per year to avoid over-replacement.  - Vitamin D, Ergocalciferol, (DRISDOL) 1.25 MG  (50000 UNIT) CAPS capsule; Take 1 capsule (50,000 Units total) by mouth every 14 (fourteen) days.  Dispense: 2 capsule; Refill: 0  2. Other hyperlipidemia Cardiovascular risk and specific lipid/LDL goals reviewed.  We discussed several lifestyle modifications today and Marc Gates will continue to work on diet, exercise and weight loss efforts. Orders and follow up as documented in patient record. Continue Lipitor and follow up with PCP.  Counseling Intensive lifestyle modifications are the first line treatment for this issue. . Dietary changes: Increase soluble fiber. Decrease simple carbohydrates. . Exercise changes: Moderate to vigorous-intensity aerobic activity 150 minutes per week if tolerated. . Lipid-lowering medications: see documented in medical record.  3. Diabetes mellitus type 2 in obese (HCC) Good blood sugar control is important to decrease the likelihood of diabetic complications such as nephropathy, neuropathy, limb loss, blindness, coronary artery disease, and death. Intensive lifestyle modification including diet, exercise and weight loss are the first line of treatment for diabetes. Continue current treatment plan.  4. Class 1 obesity due to excess calories with serious comorbidity and body mass index (BMI) of 33.0 to 33.9 in adult Marc Gates is currently in the action stage of change. As such, his goal is to continue with weight loss efforts. He has agreed to keeping a food journal and adhering to recommended goals of 1600-1700 calories and 80+ g protein.   Will stay stringently adherent to the plan.  Exercise goals: Will continue to walk 10,000 steps and increase number of days  Behavioral modification strategies: increasing lean protein intake, decreasing simple carbohydrates, increasing vegetables, increasing water intake, decreasing eating out, no skipping meals, meal planning and cooking strategies, keeping healthy foods in the home and avoiding temptations.  Marc Gates has agreed to  follow-up with our clinic in 2-3 weeks. He was informed of the importance of frequent follow-up visits to maximize his success with intensive lifestyle modifications for his multiple health conditions.   Objective:   Blood pressure 134/84, pulse 100, temperature 98 F (36.7 C), height 5\' 10"  (1.778 m), weight 233 lb (105.7 kg), SpO2 97 %. Body mass index is 33.43 kg/m.  General: Cooperative, alert, well developed, in no acute distress. HEENT: Conjunctivae and lids unremarkable. Cardiovascular: Regular rhythm.  Lungs: Normal work of breathing. Neurologic: No focal deficits.   Lab Results  Component Value Date   CREATININE 0.92 12/24/2020   BUN 18 12/24/2020   NA 140 12/24/2020   K 4.6 12/24/2020   CL 105 12/24/2020   CO2 17 (L) 12/24/2020   Lab Results  Component Value Date   ALT 22 12/24/2020   AST 22 12/24/2020   ALKPHOS 90 12/24/2020   BILITOT 0.4 12/24/2020   Lab Results  Component Value Date   HGBA1C 5.9 (H) 12/24/2020   HGBA1C 6.2 (H) 07/20/2020   HGBA1C 7.5 (H) 04/03/2019   Lab Results  Component Value Date   INSULIN 12.0 12/24/2020   INSULIN 40.2 (H) 07/20/2020   Lab Results  Component Value Date   TSH 1.260 05/27/2020   Lab Results  Component Value Date   CHOL 102 05/27/2020   HDL 47 05/27/2020   LDLCALC 30 05/27/2020   TRIG 146 05/27/2020   Lab Results  Component Value Date   WBC 15.3 (H) 12/31/2019   HGB 15.7 12/31/2019   HCT 47.4 12/31/2019   MCV 92.9 12/31/2019   PLT 256.0 12/31/2019   No results found for: IRON, TIBC, FERRITIN  Obesity Behavioral Intervention:   Approximately 15 minutes were spent on the discussion below.  ASK: We discussed the diagnosis of obesity with 01/02/2020 today and Marc Gates agreed to give Marc Gates permission to discuss obesity behavioral modification therapy today.  ASSESS: Marc Gates has the diagnosis of obesity and his BMI today is 33.5. Marc Gates is in the action stage of change.   ADVISE: Marc Gates was educated on the multiple  health risks of obesity as well as the benefit of weight loss to improve his health. He was advised of the need for long term treatment and the importance of lifestyle modifications to improve his current health and to decrease his risk of future health problems.  AGREE: Multiple dietary modification options and treatment options were discussed and Marc Gates agreed to follow the recommendations documented in the above note.  ARRANGE: Marc Gates was educated on the importance of frequent visits to treat obesity as outlined per CMS and USPSTF guidelines and agreed to schedule his next follow up appointment today.  Attestation Statements:   Reviewed by clinician on day of visit: allergies, medications, problem list, medical history, surgical history, family history, social history, and previous encounter notes.  Marc Gates, am acting as Edmund Hilda for Energy manager, DO.  I have reviewed the above documentation for accuracy and completeness, and I agree with the above. [employer], DO

## 2021-01-24 NOTE — Progress Notes (Signed)
Cardiology Office Note:   Date:  01/25/2021  NAME:  Marc Gates    MRN: [id number] DOB:  [birth year]   PCP:  Kirby Funk, MD  Cardiologist:  No primary care provider on file.  Electrophysiologist:  None   Referring MD: Kirby Funk, MD   Chief Complaint  Patient presents with  . New Patient (Initial Visit)       . Shortness of Breath    History of Present Illness:   Marc Gates is a 70 y.o. male with a hx of DM, obesity, HTN, HLD who is being seen today for the evaluation of coronary calcifications at the request of Kirby Funk, MD.  He recently had a chest CT with contrast ordered for shortness of breath.  Found to have calcifications in all 3 major coronary arteries.  He apparently had an empyema as a complication of Covid infection and underwent right thoracotomy.  He was undergoing follow-up surveillance CT scans where he was found to have severe calcifications in the coronary arteries.  He was instructed to follow-up with [name].  He reports he had a change in symptoms.  For the last 2 to 3 weeks he notices he gets short of breath when he climbs hills or does anything with heavy exertion.  Symptoms occur daily.  They mainly occur with exertion.  He has no symptoms at rest.  He reports that he has no chest pain or chest tightness.  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  I did review his CT scan.  He does have extensive calcifications noted in the coronary arteries.  He has undergone pulmonary function testing last year which was normal.  He is a former smoker of 20 years.  He is diabetic but this is well controlled.  Most recent LDL cholesterol 30.  He takes Lipitor.  He has never had a heart attack or stroke.  Family history is significant for heart disease in his mother.  He is a retired Landscape architect.  He worked with several companies in [employer] to [employer] crimes.  He reports he drinks alcohol moderation.  No  illicit drug use is reported.  He quit smoking years ago.  Problem List 1. DM -A1c 5.7 2. HTN 3. HLD -T chol 102, HDL 47, TG 146, LDL 30 4. Obesity -BMI 33 5. OSA 6. R empyema s/p thoracotomy    Past Medical History: Past Medical History:  Diagnosis Date  . Allergies   . Anxiety   . CPAP (continuous positive airway pressure) dependence   . Diabetes mellitus without complication (HCC)   . Edema of both lower extremities   . Empyema (HCC)   . GERD (gastroesophageal reflux disease)   . Hepatitis 1980'S   HEPATITIS B  . High cholesterol   . Hypertension   . Joint pain   . Pneumonia   . Sleep apnea   . SOB (shortness of breath)     Past Surgical History: Past Surgical History:  Procedure Laterality Date  . APPENDECTOMY  AGE 85 OR 13  . COLONOSCOPY WITH PROPOFOL N/A 08/13/2013   Procedure: COLONOSCOPY WITH PROPOFOL;  Surgeon: Charolett Bumpers, MD;  Location: [facility];  Service: Endoscopy;  Laterality: N/A;  . DECORTICATION Right 04/03/2019   Procedure: DECORTICATION OF RIGHT LUNG;  Surgeon: Alleen Borne, MD;  Location: [facility] OR;  Service: Thoracic;  Laterality: Right;  . EMPYEMA DRAINAGE Right 04/03/2019   Procedure: EMPYEMA DRAINAGE;  Surgeon: Evelene Croon  K, MD;  Location: [facility] OR;  Service: Thoracic;  Laterality: Right;  . EYE SURGERY Left    retinal detachment and cataract surgery  . HERNIA REPAIR  316 WEEKS OLD  . IR THORACENTESIS ASP PLEURAL SPACE W/IMG GUIDE  03/07/2019  . PAIN PUMP IMPLANTATION  04/03/2019   Procedure: PLACEMENT OF ON-Q PAIN PUMP;  Surgeon: Alleen BorneBartle, Bryan K, MD;  Location: [facility] OR;  Service: Thoracic;;  . THORACOTOMY Right 04/03/2019   Procedure: THORACOTOMY MAJOR;  Surgeon: Alleen BorneBartle, Bryan K, MD;  Location: [facility] OR;  Service: Thoracic;  Laterality: Right;    Current Medications: Current Meds  Medication Sig  . ALPRAZolam (XANAX) 0.25 MG tablet Take 1 tablet (0.25 mg total) by mouth 2 (two) times daily as needed for anxiety.  [name]. aspirin EC 81 MG tablet  Take 81 mg by mouth daily.  [name]. atorvastatin (LIPITOR) 20 MG tablet Take 20 mg by mouth every evening.  . benzonatate (TESSALON) 200 MG capsule Take 200 mg by mouth 3 (three) times daily as needed.   . busPIRone (BUSPAR) 10 MG tablet Take 2 tablets (20 mg total) by mouth 2 (two) times daily.  . chlorpheniramine (CHLOR-TRIMETON) 4 MG tablet Take 4 mg by mouth 3 (three) times daily as needed for allergies.  . Dulaglutide 1.5 MG/0.5ML SOPN Inject 3 mg into the skin every Friday.   [name]. EPINEPHrine 0.3 mg/0.3 mL IJ SOAJ injection Inject 0.3 mg into the muscle as needed for anaphylaxis.   . famotidine (PEPCID) 10 MG tablet Take 10 mg by mouth 2 (two) times daily.  . fluticasone (FLONASE) 50 MCG/ACT nasal spray Place 2 sprays into both nostrils every evening.   [name]. glucose blood test strip 1 each by Other route as needed for other. Use as instructed  . levocetirizine (XYZAL) 5 MG tablet Take 5 mg by mouth every evening.  . metFORMIN (GLUCOPHAGE-XR) 500 MG 24 hr tablet Take 1,000 mg by mouth 2 (two) times daily.  . montelukast (SINGULAIR) 10 MG tablet Take 10 mg by mouth at bedtime.  . pantoprazole (PROTONIX) 40 MG tablet Take 40 mg by mouth 2 (two) times daily.   . Vitamin D, Ergocalciferol, (DRISDOL) 1.25 MG (50000 UNIT) CAPS capsule Take 1 capsule (50,000 Units total) by mouth every 14 (fourteen) days.  [name]. zolpidem (AMBIEN) 10 MG tablet Take 10 mg by mouth at bedtime.      Allergies:    Patient has no active allergies.   Social History: Social History   Socioeconomic History  . Marital status: Single    Spouse name: Not on file  . Number of children: 0  . Years of education: Not on file  . Highest education level: Not on file  Occupational History  . Occupation: Retired Audiological scientistAccounting Professor  Tobacco Use  . Smoking status: Former Smoker    Packs/day: 1.50    Years: 15.00    Pack years: 22.50    Types: Cigarettes  . Smokeless tobacco: Never Used  Vaping Use  . Vaping Use: Never used   Substance and Sexual Activity  . Alcohol use: Yes    Comment: rare  . Drug use: No    Comment: QUIT SMOKING 25 YRS AGO  . Sexual activity: Not on file  Other Topics Concern  . Not on file  Social History Narrative  . Not on file   Social Determinants of Health   Financial Resource Strain: Not on file  Food Insecurity: Not on file  Transportation Needs: Not on file  Physical  Activity: Not on file  Stress: Not on file  Social Connections: Not on file     Family History: The patient's family history includes Anxiety disorder in his mother; Cancer in his father; Diabetes in his father; Heart disease in his mother; High Cholesterol in his father and mother; High blood pressure in his mother; Obesity in his mother.  ROS:   All other ROS reviewed and negative. Pertinent positives noted in the HPI.     EKGs/Labs/Other Studies Reviewed:   The following studies were personally reviewed by me today:  EKG:  EKG is ordered today.  The ekg ordered today demonstrates normal sinus rhythm, incomplete right bundle branch block, inferior infarct noted, and was personally reviewed by me.   Recent Labs: 05/27/2020: TSH 1.260 12/24/2020: ALT 22; BUN 18; Creatinine, Ser 0.92; Potassium 4.6; Sodium 140   Recent Lipid Panel    Component Value Date/Time   CHOL 102 05/27/2020 1326   TRIG 146 05/27/2020 1326   HDL 47 05/27/2020 1326   LDLCALC 30 05/27/2020 1326    Physical Exam:   VS:  BP 112/66 (BP Location: Left Arm, Patient Position: Sitting, Cuff Size: Large)   Pulse 70   Ht 5\' 10"  (1.778 m)   Wt 238 lb (108 kg)   BMI 34.15 kg/m    Wt Readings from Last 3 Encounters:  01/25/21 238 lb (108 kg)  01/18/21 233 lb (105.7 kg)  12/24/20 228 lb (103.4 kg)    General: Well nourished, well developed, in no acute distress Head: Atraumatic, normal size  Eyes: PEERLA, EOMI  Neck: Supple, no JVD Endocrine: No thryomegaly Cardiac: Normal S1, S2; RRR; faint 1 out of 6 systolic ejection  murmur Lungs: Clear to auscultation bilaterally, no wheezing, rhonchi or rales  Abd: Soft, nontender, no hepatomegaly  Ext: No edema, pulses 2+ Musculoskeletal: No deformities, BUE and BLE strength normal and equal Skin: Warm and dry, no rashes   Neuro: Alert and oriented to person, place, time, and situation, CNII-XII grossly intact, no focal deficits  Psych: Normal mood and affect   ASSESSMENT:   Marc Gates is a 70 y.o. male who presents for the following: 1. SOB (shortness of breath) on exertion   2. Coronary artery calcification seen on CT scan   3. Mixed hyperlipidemia   4. Obesity (BMI 30-39.9)   5. Primary hypertension     PLAN:   1. SOB (shortness of breath) on exertion 2. Coronary artery calcification seen on CT scan -He reports 2 to 3 weeks of worsening exertional shortness of breath with heavy activity.  Recent CT scan shows severe heavy calcifications in the left main and three-vessel arteries.  Symptoms could represent anginal equivalent.  EKG shows inferior infarct.  I would like for him to obtain a calcium score to quantify his coronary calcifications.  I would also like to proceed with an exercise treadmill stress test with nuclear imaging to rule out any obstructive CAD.  He also needs an echocardiogram as he has a faint murmur on examination and has an abnormal EKG. -In the meantime he will continue aspirin and statin therapy.  I see no need for antianginal therapy at this time as he has no symptoms of angina.  3. Mixed hyperlipidemia -Most recent LDL cholesterol 30.  Continue Lipitor.  4. Obesity (BMI 30-39.9) -Diet and exercise recommended.  5. Primary hypertension -Well-controlled today on current medications.  No changes.  Shared Decision Making/Informed Consent The risks [chest pain, shortness of breath, cardiac arrhythmias,  dizziness, blood pressure fluctuations, myocardial infarction, stroke/transient ischemic attack, nausea, vomiting, allergic reaction,  radiation exposure, metallic taste sensation and life-threatening complications (estimated to be 1 in 10,000)], benefits (risk stratification, diagnosing coronary artery disease, treatment guidance) and alternatives of a nuclear stress test were discussed in detail with Marc Gates and he agrees to proceed.  Disposition: Return in about 2 months (around 03/27/2021).  Medication Adjustments/Labs and Tests Ordered: Current medicines are reviewed at length with the patient today.  Concerns regarding medicines are outlined above.  Orders Placed This Encounter  Procedures  . CT CARDIAC SCORING (SELF PAY ONLY)  . Cardiac Stress Test: Informed Consent Details: Physician/Practitioner Attestation; Transcribe to consent form and obtain patient signature  . MYOCARDIAL PERFUSION IMAGING  . EKG 12-Lead  . ECHOCARDIOGRAM COMPLETE   No orders of the defined types were placed in this encounter.   Patient Instructions  Medication Instructions:  The current medical regimen is effective;  continue present plan and medications.  *If you need a refill on your cardiac medications before your next appointment, please call your pharmacy*   Lab Work: COVID TEST NEEDED   Testing/Procedures: Echocardiogram - Your physician has requested that you have an echocardiogram. Echocardiography is a painless test that uses sound waves to create images of your heart. It provides your doctor with information about the size and shape of your heart and how well your heart's chambers and valves are working. This procedure takes approximately one hour. There are no restrictions for this procedure. This will be performed at our [facility] location - [address].  Your physician has requested that you have an Exercise Myoview. A cardiac stress test is a cardiological test that measures the heart's ability to respond to external stress in a controlled clinical environment. The stress response is induced by exercise  (exercise-treadmill). For further information please visit [email or web address] If you have questions or concerns about your appointment, you can call the Nuclear Lab at [phone].   CALCIUM SCORE   Follow-Up: At [facility], you and your health needs are our priority.  As part of our continuing mission to provide you with exceptional heart care, we have created designated Provider Care Teams.  These Care Teams include your primary Cardiologist (physician) and Advanced Practice Providers (APPs -  Physician Assistants and Nurse Practitioners) who all work together to provide you with the care you need, when you need it.  We recommend signing up for the patient portal called "MyChart".  Sign up information is provided on this After Visit Summary.  MyChart is used to connect with patients for Virtual Visits (Telemedicine).  Patients are able to view lab/test results, encounter notes, upcoming appointments, etc.  Non-urgent messages can be sent to your provider as well.   To learn more about what you can do with MyChart, go to [email or web address].    Your next appointment:   2 month(s)  The format for your next appointment:   In Person  Provider:   Lennie Odor, MD      Signed, Lenna Gilford. Flora Lipps, MD, [facility]  [facility]  [address] [place], [place] [address] [phone]  01/25/2021 10:44 AM

## 2021-01-25 ENCOUNTER — Ambulatory Visit: Payer: Medicare PPO | Admitting: Cardiovascular Disease

## 2021-01-25 ENCOUNTER — Encounter: Payer: Self-pay | Admitting: Cardiovascular Disease

## 2021-01-25 ENCOUNTER — Other Ambulatory Visit: Payer: Self-pay

## 2021-01-25 VITALS — BP 112/66 | HR 70 | Ht 70.0 in | Wt 238.0 lb

## 2021-01-25 DIAGNOSIS — I10 Essential (primary) hypertension: Secondary | ICD-10-CM

## 2021-01-25 DIAGNOSIS — E1351 Other specified diabetes mellitus with diabetic peripheral angiopathy without gangrene: Secondary | ICD-10-CM | POA: Diagnosis not present

## 2021-01-25 DIAGNOSIS — R0602 Shortness of breath: Secondary | ICD-10-CM | POA: Diagnosis not present

## 2021-01-25 DIAGNOSIS — E669 Obesity, unspecified: Secondary | ICD-10-CM | POA: Diagnosis not present

## 2021-01-25 DIAGNOSIS — I2510 Atherosclerotic heart disease of native coronary artery without angina pectoris: Secondary | ICD-10-CM

## 2021-01-25 DIAGNOSIS — E782 Mixed hyperlipidemia: Secondary | ICD-10-CM

## 2021-01-25 DIAGNOSIS — L602 Onychogryphosis: Secondary | ICD-10-CM | POA: Diagnosis not present

## 2021-01-25 DIAGNOSIS — L84 Corns and callosities: Secondary | ICD-10-CM | POA: Diagnosis not present

## 2021-01-25 NOTE — Patient Instructions (Signed)
Medication Instructions:  The current medical regimen is effective;  continue present plan and medications.  *If you need a refill on your cardiac medications before your next appointment, please call your pharmacy*   Lab Work: COVID TEST NEEDED   Testing/Procedures: Echocardiogram - Your physician has requested that you have an echocardiogram. Echocardiography is a painless test that uses sound waves to create images of your heart. It provides your doctor with information about the size and shape of your heart and how well your heart's chambers and valves are working. This procedure takes approximately one hour. There are no restrictions for this procedure. This will be performed at our [facility] location - [address], [address].  Your physician has requested that you have an Exercise Myoview. A cardiac stress test is a cardiological test that measures the heart's ability to respond to external stress in a controlled clinical environment. The stress response is induced by exercise (exercise-treadmill). For further information please visit [email or web address] If you have questions or concerns about your appointment, you can call the Nuclear Lab at [phone].   CALCIUM SCORE   Follow-Up: At [facility], you and your health needs are our priority.  As part of our continuing mission to provide you with exceptional heart care, we have created designated Provider Care Teams.  These Care Teams include your primary Cardiologist (physician) and Advanced Practice Providers (APPs -  Physician Assistants and Nurse Practitioners) who all work together to provide you with the care you need, when you need it.  We recommend signing up for the patient portal called "MyChart".  Sign up information is provided on this After Visit Summary.  MyChart is used to connect with patients for Virtual Visits (Telemedicine).  Patients are able to view lab/test results, encounter notes, upcoming appointments,  etc.  Non-urgent messages can be sent to your provider as well.   To learn more about what you can do with MyChart, go to [email or web address].    Your next appointment:   2 month(s)  The format for your next appointment:   In Person  Provider:   Lennie Odor, MD

## 2021-01-26 DIAGNOSIS — J301 Allergic rhinitis due to pollen: Secondary | ICD-10-CM | POA: Diagnosis not present

## 2021-01-28 DIAGNOSIS — Z20822 Contact with and (suspected) exposure to covid-19: Secondary | ICD-10-CM | POA: Diagnosis not present

## 2021-02-01 DIAGNOSIS — Z20822 Contact with and (suspected) exposure to covid-19: Secondary | ICD-10-CM | POA: Diagnosis not present

## 2021-02-01 DIAGNOSIS — J3089 Other allergic rhinitis: Secondary | ICD-10-CM | POA: Diagnosis not present

## 2021-02-01 DIAGNOSIS — J301 Allergic rhinitis due to pollen: Secondary | ICD-10-CM | POA: Diagnosis not present

## 2021-02-06 DIAGNOSIS — N401 Enlarged prostate with lower urinary tract symptoms: Secondary | ICD-10-CM | POA: Diagnosis not present

## 2021-02-08 ENCOUNTER — Other Ambulatory Visit: Payer: Self-pay

## 2021-02-08 ENCOUNTER — Encounter (INDEPENDENT_AMBULATORY_CARE_PROVIDER_SITE_OTHER): Payer: Self-pay | Admitting: Bariatrics

## 2021-02-08 ENCOUNTER — Ambulatory Visit (INDEPENDENT_AMBULATORY_CARE_PROVIDER_SITE_OTHER): Payer: Medicare PPO | Admitting: Bariatrics

## 2021-02-08 VITALS — BP 120/80 | HR 68 | Temp 97.5°F | Ht 70.0 in | Wt 231.0 lb

## 2021-02-08 DIAGNOSIS — N401 Enlarged prostate with lower urinary tract symptoms: Secondary | ICD-10-CM | POA: Diagnosis not present

## 2021-02-08 DIAGNOSIS — E7849 Other hyperlipidemia: Secondary | ICD-10-CM

## 2021-02-08 DIAGNOSIS — Z6837 Body mass index (BMI) 37.0-37.9, adult: Secondary | ICD-10-CM

## 2021-02-08 DIAGNOSIS — E559 Vitamin D deficiency, unspecified: Secondary | ICD-10-CM

## 2021-02-08 MED ORDER — VITAMIN D (ERGOCALCIFEROL) 1.25 MG (50000 UNIT) PO CAPS: 50000.0000 [IU] | ORAL_CAPSULE | ORAL | 0 refills | Status: DC

## 2021-02-10 DIAGNOSIS — R059 Cough, unspecified: Secondary | ICD-10-CM | POA: Diagnosis not present

## 2021-02-10 DIAGNOSIS — G4733 Obstructive sleep apnea (adult) (pediatric): Secondary | ICD-10-CM | POA: Diagnosis not present

## 2021-02-10 DIAGNOSIS — J301 Allergic rhinitis due to pollen: Secondary | ICD-10-CM | POA: Diagnosis not present

## 2021-02-10 NOTE — Progress Notes (Signed)
Chief Complaint:   OBESITY Ryo is here to discuss his progress with his obesity treatment plan along with follow-up of his obesity related diagnoses. Hyrum is on keeping a food journal and adhering to recommended goals of 1600 calories and 100 protein and states he is following his eating plan approximately 60% of the time. Delfin states he is walking 10,000 steps 3-4 times per week.  Today's visit was #: 13 Starting weight: 263 lbs Starting date: 05/27/2020 Today's weight: 231 lbs Today's date: 02/08/2021 Total lbs lost to date: 32 Total lbs lost since last in-office visit: 2  Interim History: Leelan has lost an additional 2 lbs and is doing well overall. He has adequate variety with meals.  Subjective:   1. Other hyperlipidemia Dillyn is taking Lipitor.  Lab Results  Component Value Date   ALT 22 12/24/2020   AST 22 12/24/2020   ALKPHOS 90 12/24/2020   BILITOT 0.4 12/24/2020   Lab Results  Component Value Date   CHOL 102 05/27/2020   HDL 47 05/27/2020   LDLCALC 30 05/27/2020   TRIG 146 05/27/2020    2. Vitamin D deficiency Berk is taking high dose Vit D.   Ref. Range 12/24/2020 08:22  Vitamin D, 25-Hydroxy Latest Ref Range: 30.0 - 100.0 ng/mL 60.3    Assessment/Plan:   1. Other hyperlipidemia Cardiovascular risk and specific lipid/LDL goals reviewed.  We discussed several lifestyle modifications today and Canio will continue to work on diet, exercise and weight loss efforts. Orders and follow up as documented in patient record. Continue current treatment plan. Continue to increase activity.  Counseling Intensive lifestyle modifications are the first line treatment for this issue. . Dietary changes: Increase soluble fiber. Decrease simple carbohydrates. . Exercise changes: Moderate to vigorous-intensity aerobic activity 150 minutes per week if tolerated. . Lipid-lowering medications: see documented in medical record.  2. Vitamin D deficiency Low Vitamin D  level contributes to fatigue and are associated with obesity, breast, and colon cancer. He agrees to continue to take prescription Vitamin D @50 ,000 IU every week and will follow-up for routine testing of Vitamin D, at least 2-3 times per year to avoid over-replacement.  - Vitamin D, Ergocalciferol, (DRISDOL) 1.25 MG (50000 UNIT) CAPS capsule; Take 1 capsule (50,000 Units total) by mouth every 14 (fourteen) days.  Dispense: 2 capsule; Refill: 0  3. Obesity, current BMI 33 Alante is currently in the action stage of change. As such, his goal is to continue with weight loss efforts. He has agreed to keeping a food journal and adhering to recommended goals of 1600 calories and 100+ g protein.   Izea will adhere closely to the plan. Will continue to have adequate water and protein intake.  Exercise goals: As is  Behavioral modification strategies: increasing lean protein intake, decreasing simple carbohydrates, increasing vegetables, increasing water intake, decreasing eating out, no skipping meals, meal planning and cooking strategies, keeping healthy foods in the home and planning for success.  Niv has agreed to follow-up with our clinic in 2-3 weeks. He was informed of the importance of frequent follow-up visits to maximize his success with intensive lifestyle modifications for his multiple health conditions.   Objective:   Blood pressure 120/80, pulse 68, temperature (!) 97.5 F (36.4 C), height 5\' 10"  (1.778 m), weight 231 lb (104.8 kg), SpO2 97 %. Body mass index is 33.15 kg/m.  General: Cooperative, alert, well developed, in no acute distress. HEENT: Conjunctivae and lids unremarkable. Cardiovascular: Regular rhythm.  Lungs: Normal  work of breathing. Neurologic: No focal deficits.   Lab Results  Component Value Date   CREATININE 0.92 12/24/2020   BUN 18 12/24/2020   NA 140 12/24/2020   K 4.6 12/24/2020   CL 105 12/24/2020   CO2 17 (L) 12/24/2020   Lab Results  Component  Value Date   ALT 22 12/24/2020   AST 22 12/24/2020   ALKPHOS 90 12/24/2020   BILITOT 0.4 12/24/2020   Lab Results  Component Value Date   HGBA1C 5.9 (H) 12/24/2020   HGBA1C 6.2 (H) 07/20/2020   HGBA1C 7.5 (H) 04/03/2019   Lab Results  Component Value Date   INSULIN 12.0 12/24/2020   INSULIN 40.2 (H) 07/20/2020   Lab Results  Component Value Date   TSH 1.260 05/27/2020   Lab Results  Component Value Date   CHOL 102 05/27/2020   HDL 47 05/27/2020   LDLCALC 30 05/27/2020   TRIG 146 05/27/2020   Lab Results  Component Value Date   WBC 15.3 (H) 12/31/2019   HGB 15.7 12/31/2019   HCT 47.4 12/31/2019   MCV 92.9 12/31/2019   PLT 256.0 12/31/2019   No results found for: IRON, TIBC, FERRITIN  Obesity Behavioral Intervention:   Approximately 15 minutes were spent on the discussion below.  ASK: We discussed the diagnosis of obesity with [name] today and Jaquese agreed to give [name] permission to discuss obesity behavioral modification therapy today.  ASSESS: Blaiden has the diagnosis of obesity and his BMI today is 33.2. Christepher is in the action stage of change.   ADVISE: Taz was educated on the multiple health risks of obesity as well as the benefit of weight loss to improve his health. He was advised of the need for long term treatment and the importance of lifestyle modifications to improve his current health and to decrease his risk of future health problems.  AGREE: Multiple dietary modification options and treatment options were discussed and Nabor agreed to follow the recommendations documented in the above note.  ARRANGE: Winn was educated on the importance of frequent visits to treat obesity as outlined per CMS and USPSTF guidelines and agreed to schedule his next follow up appointment today.  Attestation Statements:   Reviewed by clinician on day of visit: allergies, medications, problem list, medical history, surgical history, family history, social history, and  previous encounter notes.  Edmund Hilda, am acting as Energy manager for [employer], DO.  I have reviewed the above documentation for accuracy and completeness, and I agree with the above. [name], DO

## 2021-02-12 DIAGNOSIS — Z20822 Contact with and (suspected) exposure to covid-19: Secondary | ICD-10-CM | POA: Diagnosis not present

## 2021-02-15 DIAGNOSIS — R351 Nocturia: Secondary | ICD-10-CM | POA: Diagnosis not present

## 2021-02-15 DIAGNOSIS — N401 Enlarged prostate with lower urinary tract symptoms: Secondary | ICD-10-CM | POA: Diagnosis not present

## 2021-02-15 DIAGNOSIS — J301 Allergic rhinitis due to pollen: Secondary | ICD-10-CM | POA: Diagnosis not present

## 2021-02-17 DIAGNOSIS — G4733 Obstructive sleep apnea (adult) (pediatric): Secondary | ICD-10-CM | POA: Diagnosis not present

## 2021-02-19 DIAGNOSIS — K219 Gastro-esophageal reflux disease without esophagitis: Secondary | ICD-10-CM | POA: Diagnosis not present

## 2021-02-19 DIAGNOSIS — N400 Enlarged prostate without lower urinary tract symptoms: Secondary | ICD-10-CM | POA: Diagnosis not present

## 2021-02-19 DIAGNOSIS — I2510 Atherosclerotic heart disease of native coronary artery without angina pectoris: Secondary | ICD-10-CM | POA: Diagnosis not present

## 2021-02-19 DIAGNOSIS — E785 Hyperlipidemia, unspecified: Secondary | ICD-10-CM | POA: Diagnosis not present

## 2021-02-19 DIAGNOSIS — E781 Pure hyperglyceridemia: Secondary | ICD-10-CM | POA: Diagnosis not present

## 2021-02-19 DIAGNOSIS — I10 Essential (primary) hypertension: Secondary | ICD-10-CM | POA: Diagnosis not present

## 2021-02-19 DIAGNOSIS — E1169 Type 2 diabetes mellitus with other specified complication: Secondary | ICD-10-CM | POA: Diagnosis not present

## 2021-02-22 DIAGNOSIS — J301 Allergic rhinitis due to pollen: Secondary | ICD-10-CM | POA: Diagnosis not present

## 2021-02-24 ENCOUNTER — Ambulatory Visit: Payer: Medicare PPO | Admitting: Physician Assistant

## 2021-03-01 ENCOUNTER — Other Ambulatory Visit (HOSPITAL_COMMUNITY)
Admission: RE | Admit: 2021-03-01 | Discharge: 2021-03-01 | Disposition: A | Discharge status: Home / Self Care | Payer: Medicare PPO | Source: Ambulatory Visit | Attending: Cardiology | Admitting: Cardiology

## 2021-03-01 DIAGNOSIS — Z01812 Encounter for preprocedural laboratory examination: Secondary | ICD-10-CM | POA: Diagnosis not present

## 2021-03-01 DIAGNOSIS — Z20822 Contact with and (suspected) exposure to covid-19: Secondary | ICD-10-CM | POA: Diagnosis not present

## 2021-03-02 LAB — SARS CORONAVIRUS 2 (TAT 6-24 HRS): SARS Coronavirus 2: NEGATIVE

## 2021-03-03 ENCOUNTER — Telehealth (HOSPITAL_COMMUNITY): Payer: Self-pay | Admitting: *Deleted

## 2021-03-03 ENCOUNTER — Other Ambulatory Visit (HOSPITAL_COMMUNITY): Payer: Medicare PPO

## 2021-03-03 NOTE — Telephone Encounter (Signed)
Close encounter 

## 2021-03-04 ENCOUNTER — Ambulatory Visit (HOSPITAL_COMMUNITY)
Admission: RE | Admit: 2021-03-04 | Discharge: 2021-03-04 | Disposition: A | Discharge status: Home / Self Care | Payer: Medicare PPO | Source: Ambulatory Visit | Attending: Cardiology | Admitting: Cardiology

## 2021-03-04 ENCOUNTER — Other Ambulatory Visit: Payer: Self-pay

## 2021-03-04 DIAGNOSIS — R0602 Shortness of breath: Secondary | ICD-10-CM

## 2021-03-04 LAB — MYOCARDIAL PERFUSION IMAGING
Estimated workload: 7.1 METS
Exercise duration (min): 7 min
Exercise duration (sec): 15 s
LV dias vol: 117 mL (ref 62–150)
LV sys vol: 45 mL
MPHR: 151 {beats}/min
Peak HR: 144 {beats}/min
Percent HR: 95 %
Rest HR: 66 {beats}/min
SDS: 4
SRS: 8
SSS: 12
TID: 1

## 2021-03-04 MED ORDER — TECHNETIUM TC 99M TETROFOSMIN IV KIT
11.0000 | PACK | Freq: Once | INTRAVENOUS | Status: AC | PRN
  Administered 2021-03-04: 11 via INTRAVENOUS
  Filled 2021-03-04: qty 11

## 2021-03-04 MED ORDER — TECHNETIUM TC 99M TETROFOSMIN IV KIT
31.8000 | PACK | Freq: Once | INTRAVENOUS | Status: AC | PRN
  Administered 2021-03-04: 31.8 via INTRAVENOUS
  Filled 2021-03-04: qty 32

## 2021-03-05 ENCOUNTER — Ambulatory Visit (INDEPENDENT_AMBULATORY_CARE_PROVIDER_SITE_OTHER)
Admission: RE | Admit: 2021-03-05 | Discharge: 2021-03-05 | Disposition: A | Discharge status: Home / Self Care | Payer: Self-pay | Source: Ambulatory Visit | Attending: Cardiovascular Disease | Admitting: Cardiovascular Disease

## 2021-03-05 ENCOUNTER — Ambulatory Visit (HOSPITAL_COMMUNITY): Payer: Medicare PPO | Attending: Cardiovascular Disease

## 2021-03-05 DIAGNOSIS — R0602 Shortness of breath: Secondary | ICD-10-CM

## 2021-03-05 DIAGNOSIS — J301 Allergic rhinitis due to pollen: Secondary | ICD-10-CM | POA: Diagnosis not present

## 2021-03-05 LAB — ECHOCARDIOGRAM COMPLETE
Area-P 1/2: 3.16 cm2
S' Lateral: 3.3 cm

## 2021-03-08 ENCOUNTER — Other Ambulatory Visit: Payer: Self-pay

## 2021-03-08 ENCOUNTER — Ambulatory Visit (INDEPENDENT_AMBULATORY_CARE_PROVIDER_SITE_OTHER): Payer: Medicare PPO | Admitting: Bariatrics

## 2021-03-08 ENCOUNTER — Encounter (INDEPENDENT_AMBULATORY_CARE_PROVIDER_SITE_OTHER): Payer: Self-pay | Admitting: Bariatrics

## 2021-03-08 VITALS — BP 127/79 | HR 75 | Temp 97.7°F | Ht 70.0 in | Wt 229.0 lb

## 2021-03-08 DIAGNOSIS — E7849 Other hyperlipidemia: Secondary | ICD-10-CM

## 2021-03-08 DIAGNOSIS — Z6837 Body mass index (BMI) 37.0-37.9, adult: Secondary | ICD-10-CM

## 2021-03-08 DIAGNOSIS — E559 Vitamin D deficiency, unspecified: Secondary | ICD-10-CM | POA: Diagnosis not present

## 2021-03-08 DIAGNOSIS — J301 Allergic rhinitis due to pollen: Secondary | ICD-10-CM | POA: Diagnosis not present

## 2021-03-08 MED ORDER — VITAMIN D (ERGOCALCIFEROL) 1.25 MG (50000 UNIT) PO CAPS: 50000.0000 [IU] | ORAL_CAPSULE | ORAL | 0 refills | Status: DC

## 2021-03-08 NOTE — H&P (View-Only) (Signed)
Cardiology Office Note:   Date:  03/09/2021  NAME:  Marc Gates    MRN: [id number] DOB:  11/25/1950   PCP:  Griffin, John, MD  Cardiologist:  None  Electrophysiologist:  None   Referring MD: Griffin, John, MD   Chief Complaint  Patient presents with  . Coronary Artery Disease   History of Present Illness:   Marc Gates is a 70 y.o. male with a hx of DM, HLD, OSA, former tobacco abuse who presents for follow-up. Seen 3/21 for SOB. Abnormal MPI. CAC score elevated.  He presents for follow-up of his myocardial perfusion imaging study.  Recently seen for shortness of breath.  Had coronary calcification seen on CT of his chest.  He had a complicated pneumonia with right empyema.  Coronary calcium was found for that reason.  He did report some exertional shortness of breath.  Symptoms appear to have been worsened by a mask.  He reports that he is still walking 9000-10,000 steps daily.  He denies any chest pain or chest tightness.  We did go over the results of his coronary calcium scoring.  He has an elevated coronary calcium score.  Also had a stress test with concerns for inferior infarct.  There is also mention of ischemia in the inferior segments.  I did review the stress test.  He has a fixed perfusion defect in the inferior wall with normal wall motion.  This is likely artifact.  We discussed that his high coronary calcium score makes CAD more likely.  I have recommended a left heart catheterization to clarify what is going on.  He is okay to do this.  He reports he does have a speaking engagement.  He works as a forensic accountant.  He has plans to go to [place].  Given lack of chest pain symptoms and this is reasonable.  He can proceed and come back for left heart catheterization.  His blood pressure is 130/71.  He is on aspirin.  Most recent LDL cholesterol 30.  Again he denies any chest pain symptoms today.   Retired forensic accountant. Worked for [employer].   Problem List 1.  DM -A1c 5.7 2. HTN 3. HLD -T chol 102, HDL 47, TG 146, LDL 30 4. Obesity -BMI 33 5. OSA 6. R empyema s/p thoracotomy (PNA) 7. Former tobacco abuse  -20 pack years  8. CAD -CAC 823 (83rd percentile) -abnormal MPI  Past Medical History: Past Medical History:  Diagnosis Date  . Allergies   . Anxiety   . CPAP (continuous positive airway pressure) dependence   . Diabetes mellitus without complication (HCC)   . Edema of both lower extremities   . Empyema (HCC)   . GERD (gastroesophageal reflux disease)   . Hepatitis 1980'S   HEPATITIS B  . High cholesterol   . Hypertension   . Joint pain   . Pneumonia   . Sleep apnea   . SOB (shortness of breath)     Past Surgical History: Past Surgical History:  Procedure Laterality Date  . APPENDECTOMY  AGE 12 OR 13  . COLONOSCOPY WITH PROPOFOL N/A 08/13/2013   Procedure: COLONOSCOPY WITH PROPOFOL;  Surgeon: Martin K Johnson, MD;  Location: [facility];  Service: Endoscopy;  Laterality: N/A;  . DECORTICATION Right 04/03/2019   Procedure: DECORTICATION OF RIGHT LUNG;  Surgeon: Bartle, Bryan K, MD;  Location: [facility] OR;  Service: Thoracic;  Laterality: Right;  . EMPYEMA DRAINAGE Right 04/03/2019   Procedure: EMPYEMA DRAINAGE;  Surgeon: Bartle, Bryan   K, MD;  Location: [facility] OR;  Service: Thoracic;  Laterality: Right;  . EYE SURGERY Left    retinal detachment and cataract surgery  . HERNIA REPAIR  48 WEEKS OLD  . IR THORACENTESIS ASP PLEURAL SPACE W/IMG GUIDE  03/07/2019  . PAIN PUMP IMPLANTATION  04/03/2019   Procedure: PLACEMENT OF ON-Q PAIN PUMP;  Surgeon: Alleen Borne, MD;  Location: [facility] OR;  Service: Thoracic;;  . THORACOTOMY Right 04/03/2019   Procedure: THORACOTOMY MAJOR;  Surgeon: Alleen Borne, MD;  Location: [facility] OR;  Service: Thoracic;  Laterality: Right;    Current Medications: Current Meds  Medication Sig  . ALPRAZolam (XANAX) 0.25 MG tablet Take 1 tablet (0.25 mg total) by mouth 2 (two) times daily as needed for anxiety.  [name]  aspirin EC 81 MG tablet Take 81 mg by mouth daily.  [name] atorvastatin (LIPITOR) 20 MG tablet Take 20 mg by mouth every evening.  . benzonatate (TESSALON) 200 MG capsule Take 200 mg by mouth 3 (three) times daily as needed.   . busPIRone (BUSPAR) 10 MG tablet Take 2 tablets (20 mg total) by mouth 2 (two) times daily.  . chlorpheniramine (CHLOR-TRIMETON) 4 MG tablet Take 4 mg by mouth 3 (three) times daily as needed for allergies.  . Dulaglutide 1.5 MG/0.5ML SOPN Inject 3 mg into the skin every Friday.   [name] EPINEPHrine 0.3 mg/0.3 mL IJ SOAJ injection Inject 0.3 mg into the muscle as needed for anaphylaxis.   . famotidine (PEPCID) 10 MG tablet Take 10 mg by mouth 2 (two) times daily.  . fluticasone (FLONASE) 50 MCG/ACT nasal spray Place 2 sprays into both nostrils every evening.   [name] glucose blood test strip 1 each by Other route as needed for other. Use as instructed  . levocetirizine (XYZAL) 5 MG tablet Take 5 mg by mouth every evening.  . metFORMIN (GLUCOPHAGE-XR) 500 MG 24 hr tablet Take 1,000 mg by mouth 2 (two) times daily.  . montelukast (SINGULAIR) 10 MG tablet Take 10 mg by mouth at bedtime.  . nitroGLYCERIN (NITROSTAT) 0.4 MG SL tablet Place 1 tablet (0.4 mg total) under the tongue every 5 (five) minutes as needed for chest pain.  . pantoprazole (PROTONIX) 40 MG tablet Take 40 mg by mouth 2 (two) times daily.   . tadalafil (CIALIS) 5 MG tablet   . Vitamin D, Ergocalciferol, (DRISDOL) 1.25 MG (50000 UNIT) CAPS capsule Take 1 capsule (50,000 Units total) by mouth every 14 (fourteen) days.  [name] zolpidem (AMBIEN) 10 MG tablet Take 10 mg by mouth at bedtime.      Allergies:    Patient has no active allergies.   Social History: Social History   Socioeconomic History  . Marital status: Single    Spouse name: Not on file  . Number of children: 0  . Years of education: Not on file  . Highest education level: Not on file  Occupational History  . Occupation: Retired Audiological scientist Professor   Tobacco Use  . Smoking status: Former Smoker    Packs/day: 1.50    Years: 15.00    Pack years: 22.50    Types: Cigarettes  . Smokeless tobacco: Never Used  Vaping Use  . Vaping Use: Never used  Substance and Sexual Activity  . Alcohol use: Yes    Comment: rare  . Drug use: No    Comment: QUIT SMOKING 25 YRS AGO  . Sexual activity: Not on file  Other Topics Concern  . Not on file  Social History  Narrative  . Not on file   Social Determinants of Health   Financial Resource Strain: Not on file  Food Insecurity: Not on file  Transportation Needs: Not on file  Physical Activity: Not on file  Stress: Not on file  Social Connections: Not on file     Family History: The patient's family history includes Anxiety disorder in his mother; Cancer in his father; Diabetes in his father; Heart disease in his mother; High Cholesterol in his father and mother; High blood pressure in his mother; Obesity in his mother.  ROS:   All other ROS reviewed and negative. Pertinent positives noted in the HPI.     EKGs/Labs/Other Studies Reviewed:   The following studies were personally reviewed by me today:  EKG:  EKG is ordered today.  The ekg ordered today demonstrates normal sinus rhythm heart rate 84, inferior infarct noted, incomplete right bundle branch block, and was personally reviewed by me.   TTE 03/05/2021 1. Left ventricular ejection fraction, by estimation, is 55 to 60%. The  left ventricle has normal function. The left ventricle has no regional  wall motion abnormalities. Left ventricular diastolic parameters are  consistent with Grade I diastolic  dysfunction (impaired relaxation).  2. Right ventricular systolic function is normal. The right ventricular  size is normal.  3. Left atrial size was mildly dilated.  4. Right atrial size was mildly dilated.  5. The mitral valve is normal in structure. No evidence of mitral valve  regurgitation. No evidence of mitral stenosis.   6. The aortic valve is normal in structure. Aortic valve regurgitation is  not visualized. No aortic stenosis is present.  7. The inferior vena cava is normal in size with greater than 50%  respiratory variability, suggesting right atrial pressure of 3 mmHg.    NM stress 03/04/2021 There is a medium size, moderate fixed perfusion defect in the apical cap and inferoapex. There is a moderate perfusion defect in the mid and basal inferior wall that is partially reversible to mild at rest, suggestive of ischemia in setting of prior infarct. Mild hypokinesis of inferoapex and mid inferior wall.    Recent Labs: 05/27/2020: TSH 1.260 12/24/2020: ALT 22; BUN 18; Creatinine, Ser 0.92; Potassium 4.6; Sodium 140   Recent Lipid Panel    Component Value Date/Time   CHOL 102 05/27/2020 1326   TRIG 146 05/27/2020 1326   HDL 47 05/27/2020 1326   LDLCALC 30 05/27/2020 1326    Physical Exam:   VS:  BP 130/71   Pulse 84   Ht  (1.778 m)   Wt 235 lb (106.6 kg)   SpO2 96%   BMI 33.72 kg/m    Wt Readings from Last 3 Encounters:  03/09/21 235 lb (106.6 kg)  03/08/21 229 lb (103.9 kg)  03/04/21 231 lb (104.8 kg)    General: Well nourished, well developed, in no acute distress Head: Atraumatic, normal size  Eyes: PEERLA, EOMI  Neck: Supple, no JVD Endocrine: No thryomegaly Cardiac: Normal S1, S2; RRR; no murmurs, rubs, or gallops Lungs: Clear to auscultation bilaterally, no wheezing, rhonchi or rales  Abd: Soft, nontender, no hepatomegaly  Ext: No edema, pulses 2+ Musculoskeletal: No deformities, BUE and BLE strength normal and equal Skin: Warm and dry, no rashes   Neuro: Alert and oriented to person, place, time, and situation, CNII-XII grossly intact, no focal deficits  Psych: Normal mood and affect   ASSESSMENT:   HALVOR BEHREND is a 70 y.o. male who presents  for the following: 1. Abnormal nuclear stress test   2. Coronary artery disease involving native coronary artery of native  heart without angina pectoris   3. Agatston coronary artery calcium score greater than 400   4. Mixed hyperlipidemia     PLAN:   1. Abnormal nuclear stress test 2. Coronary artery disease involving native coronary artery of native heart without angina pectoris 3. Agatston coronary artery calcium score greater than 400 4. Mixed hyperlipidemia -Recently evaluated for shortness of breath.  Had extensive coronary calcification seen on CT chest of the lungs ordered for empyema follow-up.  We decided to proceed with coronary calcium scoring which demonstrated a coronary calcium score of 823 which is the 83rd percentile.  He underwent nuclear medicine stress test which demonstrated a fixed inferior defect with possible inferior ischemia.  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  I did review the stress test myself.  There is a fixed perfusion defect in inferior wall with normal wall motion.  I suspect this is just artifactual due to diaphragm attenuation.  However there is mention of ischemia.  Given his symptoms of shortness of breath I think we need to clarify what is going on.  I recommended left heart catheterization to define the anatomy.  He describes no chest pain or symptoms concerning for unstable angina.  He does get short of breath with high exertion.  He nonetheless is able to walk 9-10,000 steps daily without any significant symptoms.  I think it is reasonable for him to proceed to his speaking engagement in [place] in 2 weeks.  He will then come back and we will plan for an outpatient left heart catheterization.  There is no urgency for this.  This could be artifact on the nuclear medicine myocardial perfusion imaging study.  He will continue with aspirin and Lipitor.  We will give him a prescription for sublingual nitroglycerin.  He was given strict return precautions if he does develop chest pain symptoms.  He also will notify us if there is any change.  Risk and benefits  discussed regarding left heart catheterization.  See consent statement below.  He will give us labs today for his left heart catheterization  Shared Decision Making/Informed Consent The risks [stroke (1 in 1000), death (1 in 1000), kidney failure [usually temporary] (1 in 500), bleeding (1 in 200), allergic reaction [possibly serious] (1 in 200)], benefits (diagnostic support and management of coronary artery disease) and alternatives of a cardiac catheterization were discussed in detail with Mr. Anastasia FiedlerHelms and he is willing to proceed.  Disposition: Return in about 6 weeks (around 04/20/2021).  Medication Adjustments/Labs and Tests Ordered: Current medicines are reviewed at length with the patient today.  Concerns regarding medicines are outlined above.  Orders Placed This Encounter  Procedures  . Basic metabolic panel  . CBC  . EKG 12-Lead   Meds ordered this encounter  Medications  . nitroGLYCERIN (NITROSTAT) 0.4 MG SL tablet    Sig: Place 1 tablet (0.4 mg total) under the tongue every 5 (five) minutes as needed for chest pain.    Dispense:  25 tablet    Refill:  3    Patient Instructions  Medication Instructions:  Take Nitroglycerin as instructed  *If you need a refill on your cardiac medications before your next appointment, please call your pharmacy*   Lab Work: BMET, CBC today COVID TESTING: 05/27 at 12:00 PM- [address], [place]   If you have labs (blood work) drawn  today and your tests are completely normal, you will receive your results only by: [name] MyChart Message (if you have MyChart) OR . A paper copy in the mail If you have any lab test that is abnormal or we need to change your treatment, we will call you to review the results.   Testing/Procedures: Your physician has requested that you have a cardiac catheterization. Cardiac catheterization is used to diagnose and/or treat various heart conditions. Doctors may recommend this procedure for a number of  different reasons. The most common reason is to evaluate chest pain. Chest pain can be a symptom of coronary artery disease (CAD), and cardiac catheterization can show whether plaque is narrowing or blocking your heart's arteries. This procedure is also used to evaluate the valves, as well as measure the blood flow and oxygen levels in different parts of your heart. For further information please visit [email or web address] Please follow instruction sheet, as given.   Follow-Up: At [facility], you and your health needs are our priority.  As part of our continuing mission to provide you with exceptional heart care, we have created designated Provider Care Teams.  These Care Teams include your primary Cardiologist (physician) and Advanced Practice Providers (APPs -  Physician Assistants and Nurse Practitioners) who all work together to provide you with the care you need, when you need it.  We recommend signing up for the patient portal called "MyChart".  Sign up information is provided on this After Visit Summary.  MyChart is used to connect with patients for Virtual Visits (Telemedicine).  Patients are able to view lab/test results, encounter notes, upcoming appointments, etc.  Non-urgent messages can be sent to your provider as well.   To learn more about what you can do with MyChart, go to [email or web address].    Your next appointment:   June    The format for your next appointment:   In Person  Provider:   Lennie Odor, MD   Other Instructions    [facility] CARDIOVASCULAR DIVISION [facility] [address] [place] [address] Dept: [phone] Loc: [phone]  OSHEA PERCIVAL  03/09/2021  You are scheduled for a Cardiac Catheterization on Tuesday, May 31 with Dr. Peter [place].  1. Please arrive at the [facility] (Main Entrance A) at [facility]: [address] [place], [place] [address] at 5:30 AM (This time is  two hours before your procedure to ensure your preparation). Free valet parking service is available.   Special note: Every effort is made to have your procedure done on time. Please understand that emergencies sometimes delay scheduled procedures.  2. Diet: Do not eat solid foods after midnight.  The patient may have clear liquids until 5am upon the day of the procedure.  3. Labs: You will need to have blood drawn today CBC, BMET.   4. Medication instructions in preparation for your procedure:   Contrast Allergy: No   Do not take Diabetes Med Glucophage (Metformin) on the day of the procedure and HOLD 48 HOURS AFTER THE PROCEDURE.  On the morning of your procedure, take your Aspirin and any morning medicines NOT listed above.  You may use sips of water.  5. Plan for one night stay--bring personal belongings. 6. Bring a current list of your medications and current insurance cards. 7. You MUST have a responsible person to drive you home. 8. Someone MUST be with you the first 24 hours after you arrive home or your discharge  will be delayed. 9. Please wear clothes that are easy to get on and off and wear slip-on shoes.  Thank you for allowing [name] to care for you!   -- [facility]      Time Spent with Patient: I have spent a total of 35 minutes with patient reviewing hospital notes, telemetry, EKGs, labs and examining the patient as well as establishing an assessment and plan that was discussed with the patient.  > 50% of time was spent in direct patient care.  Signed, Lenna Gilford. Flora Lipps, MD, [facility]  [facility]  [address], [address] [place], [place] [address] [phone]  03/09/2021 10:47 AM

## 2021-03-08 NOTE — Progress Notes (Signed)
Cardiology Office Note:   Date:  03/09/2021  NAME:  Marc Gates    MRN: [id number] DOB:  [birth year]   PCP:  Kirby Funk, MD  Cardiologist:  None  Electrophysiologist:  None   Referring MD: Kirby Funk, MD   Chief Complaint  Patient presents with  . Coronary Artery Disease   History of Present Illness:   Marc Gates is a 70 y.o. male with a hx of DM, HLD, OSA, former tobacco abuse who presents for follow-up. Seen 3/21 for SOB. Abnormal MPI. CAC score elevated.  He presents for follow-up of his myocardial perfusion imaging study.  Recently seen for shortness of breath.  Had coronary calcification seen on CT of his chest.  He had a complicated pneumonia with right empyema.  Coronary calcium was found for that reason.  He did report some exertional shortness of breath.  Symptoms appear to have been worsened by a mask.  He reports that he is still walking 9000-10,000 steps daily.  He denies any chest pain or chest tightness.  We did go over the results of his coronary calcium scoring.  He has an elevated coronary calcium score.  Also had a stress test with concerns for inferior infarct.  There is also mention of ischemia in the inferior segments.  I did review the stress test.  He has a fixed perfusion defect in the inferior wall with normal wall motion.  This is likely artifact.  We discussed that his high coronary calcium score makes CAD more likely.  I have recommended a left heart catheterization to clarify what is going on.  He is okay to do this.  He reports he does have a speaking engagement.  He works as a Landscape architect.  He has plans to go to [place].  Given lack of chest pain symptoms and this is reasonable.  He can proceed and come back for left heart catheterization.  His blood pressure is 130/71.  He is on aspirin.  Most recent LDL cholesterol 30.  Again he denies any chest pain symptoms today.   Retired Landscape architect. Worked for [employer].   Problem List 1.  DM -A1c 5.7 2. HTN 3. HLD -T chol 102, HDL 47, TG 146, LDL 30 4. Obesity -BMI 33 5. OSA 6. R empyema s/p thoracotomy (PNA) 7. Former tobacco abuse  -20 pack years  8. CAD -CAC 823 (83rd percentile) -abnormal MPI  Past Medical History: Past Medical History:  Diagnosis Date  . Allergies   . Anxiety   . CPAP (continuous positive airway pressure) dependence   . Diabetes mellitus without complication (HCC)   . Edema of both lower extremities   . Empyema (HCC)   . GERD (gastroesophageal reflux disease)   . Hepatitis 1980'S   HEPATITIS B  . High cholesterol   . Hypertension   . Joint pain   . Pneumonia   . Sleep apnea   . SOB (shortness of breath)     Past Surgical History: Past Surgical History:  Procedure Laterality Date  . APPENDECTOMY  AGE 89 OR 13  . COLONOSCOPY WITH PROPOFOL N/A 08/13/2013   Procedure: COLONOSCOPY WITH PROPOFOL;  Surgeon: Charolett Bumpers, MD;  Location: [facility];  Service: Endoscopy;  Laterality: N/A;  . DECORTICATION Right 04/03/2019   Procedure: DECORTICATION OF RIGHT LUNG;  Surgeon: Alleen Borne, MD;  Location: [facility] OR;  Service: Thoracic;  Laterality: Right;  . EMPYEMA DRAINAGE Right 04/03/2019   Procedure: EMPYEMA DRAINAGE;  Surgeon: Evelene Croon  K, MD;  Location: [facility] OR;  Service: Thoracic;  Laterality: Right;  . EYE SURGERY Left    retinal detachment and cataract surgery  . HERNIA REPAIR  48 WEEKS OLD  . IR THORACENTESIS ASP PLEURAL SPACE W/IMG GUIDE  03/07/2019  . PAIN PUMP IMPLANTATION  04/03/2019   Procedure: PLACEMENT OF ON-Q PAIN PUMP;  Surgeon: Alleen Borne, MD;  Location: [facility] OR;  Service: Thoracic;;  . THORACOTOMY Right 04/03/2019   Procedure: THORACOTOMY MAJOR;  Surgeon: Alleen Borne, MD;  Location: [facility] OR;  Service: Thoracic;  Laterality: Right;    Current Medications: Current Meds  Medication Sig  . ALPRAZolam (XANAX) 0.25 MG tablet Take 1 tablet (0.25 mg total) by mouth 2 (two) times daily as needed for anxiety.  [name]  aspirin EC 81 MG tablet Take 81 mg by mouth daily.  [name] atorvastatin (LIPITOR) 20 MG tablet Take 20 mg by mouth every evening.  . benzonatate (TESSALON) 200 MG capsule Take 200 mg by mouth 3 (three) times daily as needed.   . busPIRone (BUSPAR) 10 MG tablet Take 2 tablets (20 mg total) by mouth 2 (two) times daily.  . chlorpheniramine (CHLOR-TRIMETON) 4 MG tablet Take 4 mg by mouth 3 (three) times daily as needed for allergies.  . Dulaglutide 1.5 MG/0.5ML SOPN Inject 3 mg into the skin every Friday.   [name] EPINEPHrine 0.3 mg/0.3 mL IJ SOAJ injection Inject 0.3 mg into the muscle as needed for anaphylaxis.   . famotidine (PEPCID) 10 MG tablet Take 10 mg by mouth 2 (two) times daily.  . fluticasone (FLONASE) 50 MCG/ACT nasal spray Place 2 sprays into both nostrils every evening.   [name] glucose blood test strip 1 each by Other route as needed for other. Use as instructed  . levocetirizine (XYZAL) 5 MG tablet Take 5 mg by mouth every evening.  . metFORMIN (GLUCOPHAGE-XR) 500 MG 24 hr tablet Take 1,000 mg by mouth 2 (two) times daily.  . montelukast (SINGULAIR) 10 MG tablet Take 10 mg by mouth at bedtime.  . nitroGLYCERIN (NITROSTAT) 0.4 MG SL tablet Place 1 tablet (0.4 mg total) under the tongue every 5 (five) minutes as needed for chest pain.  . pantoprazole (PROTONIX) 40 MG tablet Take 40 mg by mouth 2 (two) times daily.   . tadalafil (CIALIS) 5 MG tablet   . Vitamin D, Ergocalciferol, (DRISDOL) 1.25 MG (50000 UNIT) CAPS capsule Take 1 capsule (50,000 Units total) by mouth every 14 (fourteen) days.  [name] zolpidem (AMBIEN) 10 MG tablet Take 10 mg by mouth at bedtime.      Allergies:    Patient has no active allergies.   Social History: Social History   Socioeconomic History  . Marital status: Single    Spouse name: Not on file  . Number of children: 0  . Years of education: Not on file  . Highest education level: Not on file  Occupational History  . Occupation: Retired Audiological scientist Professor   Tobacco Use  . Smoking status: Former Smoker    Packs/day: 1.50    Years: 15.00    Pack years: 22.50    Types: Cigarettes  . Smokeless tobacco: Never Used  Vaping Use  . Vaping Use: Never used  Substance and Sexual Activity  . Alcohol use: Yes    Comment: rare  . Drug use: No    Comment: QUIT SMOKING 25 YRS AGO  . Sexual activity: Not on file  Other Topics Concern  . Not on file  Social History  Narrative  . Not on file   Social Determinants of Health   Financial Resource Strain: Not on file  Food Insecurity: Not on file  Transportation Needs: Not on file  Physical Activity: Not on file  Stress: Not on file  Social Connections: Not on file     Family History: The patient's family history includes Anxiety disorder in his mother; Cancer in his father; Diabetes in his father; Heart disease in his mother; High Cholesterol in his father and mother; High blood pressure in his mother; Obesity in his mother.  ROS:   All other ROS reviewed and negative. Pertinent positives noted in the HPI.     EKGs/Labs/Other Studies Reviewed:   The following studies were personally reviewed by me today:  EKG:  EKG is ordered today.  The ekg ordered today demonstrates normal sinus rhythm heart rate 84, inferior infarct noted, incomplete right bundle branch block, and was personally reviewed by me.   TTE 03/05/2021 1. Left ventricular ejection fraction, by estimation, is 55 to 60%. The  left ventricle has normal function. The left ventricle has no regional  wall motion abnormalities. Left ventricular diastolic parameters are  consistent with Grade I diastolic  dysfunction (impaired relaxation).  2. Right ventricular systolic function is normal. The right ventricular  size is normal.  3. Left atrial size was mildly dilated.  4. Right atrial size was mildly dilated.  5. The mitral valve is normal in structure. No evidence of mitral valve  regurgitation. No evidence of mitral stenosis.   6. The aortic valve is normal in structure. Aortic valve regurgitation is  not visualized. No aortic stenosis is present.  7. The inferior vena cava is normal in size with greater than 50%  respiratory variability, suggesting right atrial pressure of 3 mmHg.    NM stress 03/04/2021 There is a medium size, moderate fixed perfusion defect in the apical cap and inferoapex. There is a moderate perfusion defect in the mid and basal inferior wall that is partially reversible to mild at rest, suggestive of ischemia in setting of prior infarct. Mild hypokinesis of inferoapex and mid inferior wall.    Recent Labs: 05/27/2020: TSH 1.260 12/24/2020: ALT 22; BUN 18; Creatinine, Ser 0.92; Potassium 4.6; Sodium 140   Recent Lipid Panel    Component Value Date/Time   CHOL 102 05/27/2020 1326   TRIG 146 05/27/2020 1326   HDL 47 05/27/2020 1326   LDLCALC 30 05/27/2020 1326    Physical Exam:   VS:  BP 130/71   Pulse 84   Ht  (1.778 m)   Wt 235 lb (106.6 kg)   SpO2 96%   BMI 33.72 kg/m    Wt Readings from Last 3 Encounters:  03/09/21 235 lb (106.6 kg)  03/08/21 229 lb (103.9 kg)  03/04/21 231 lb (104.8 kg)    General: Well nourished, well developed, in no acute distress Head: Atraumatic, normal size  Eyes: PEERLA, EOMI  Neck: Supple, no JVD Endocrine: No thryomegaly Cardiac: Normal S1, S2; RRR; no murmurs, rubs, or gallops Lungs: Clear to auscultation bilaterally, no wheezing, rhonchi or rales  Abd: Soft, nontender, no hepatomegaly  Ext: No edema, pulses 2+ Musculoskeletal: No deformities, BUE and BLE strength normal and equal Skin: Warm and dry, no rashes   Neuro: Alert and oriented to person, place, time, and situation, CNII-XII grossly intact, no focal deficits  Psych: Normal mood and affect   ASSESSMENT:   Marc Gates is a 70 y.o. male who presents  for the following: 1. Abnormal nuclear stress test   2. Coronary artery disease involving native coronary artery of native  heart without angina pectoris   3. Agatston coronary artery calcium score greater than 400   4. Mixed hyperlipidemia     PLAN:   1. Abnormal nuclear stress test 2. Coronary artery disease involving native coronary artery of native heart without angina pectoris 3. Agatston coronary artery calcium score greater than 400 4. Mixed hyperlipidemia -Recently evaluated for shortness of breath.  Had extensive coronary calcification seen on CT chest of the lungs ordered for empyema follow-up.  We decided to proceed with coronary calcium scoring which demonstrated a coronary calcium score of 823 which is the 83rd percentile.  He underwent nuclear medicine stress test which demonstrated a fixed inferior defect with possible inferior ischemia.  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  I did review the stress test myself.  There is a fixed perfusion defect in inferior wall with normal wall motion.  I suspect this is just artifactual due to diaphragm attenuation.  However there is mention of ischemia.  Given his symptoms of shortness of breath I think we need to clarify what is going on.  I recommended left heart catheterization to define the anatomy.  He describes no chest pain or symptoms concerning for unstable angina.  He does get short of breath with high exertion.  He nonetheless is able to walk 9-10,000 steps daily without any significant symptoms.  I think it is reasonable for him to proceed to his speaking engagement in [place] in 2 weeks.  He will then come back and we will plan for an outpatient left heart catheterization.  There is no urgency for this.  This could be artifact on the nuclear medicine myocardial perfusion imaging study.  He will continue with aspirin and Lipitor.  We will give him a prescription for sublingual nitroglycerin.  He was given strict return precautions if he does develop chest pain symptoms.  He also will notify us if there is any change.  Risk and benefits  discussed regarding left heart catheterization.  See consent statement below.  He will give us labs today for his left heart catheterization  Shared Decision Making/Informed Consent The risks [stroke (1 in 1000), death (1 in 1000), kidney failure [usually temporary] (1 in 500), bleeding (1 in 200), allergic reaction [possibly serious] (1 in 200)], benefits (diagnostic support and management of coronary artery disease) and alternatives of a cardiac catheterization were discussed in detail with Marc Gates and he is willing to proceed.  Disposition: Return in about 6 weeks (around 04/20/2021).  Medication Adjustments/Labs and Tests Ordered: Current medicines are reviewed at length with the patient today.  Concerns regarding medicines are outlined above.  Orders Placed This Encounter  Procedures  . Basic metabolic panel  . CBC  . EKG 12-Lead   Meds ordered this encounter  Medications  . nitroGLYCERIN (NITROSTAT) 0.4 MG SL tablet    Sig: Place 1 tablet (0.4 mg total) under the tongue every 5 (five) minutes as needed for chest pain.    Dispense:  25 tablet    Refill:  3    Patient Instructions  Medication Instructions:  Take Nitroglycerin as instructed  *If you need a refill on your cardiac medications before your next appointment, please call your pharmacy*   Lab Work: BMET, CBC today COVID TESTING: 05/27 at 12:00 PM- [address], [place]   If you have labs (blood work) drawn  today and your tests are completely normal, you will receive your results only by: [name] MyChart Message (if you have MyChart) OR . A paper copy in the mail If you have any lab test that is abnormal or we need to change your treatment, we will call you to review the results.   Testing/Procedures: Your physician has requested that you have a cardiac catheterization. Cardiac catheterization is used to diagnose and/or treat various heart conditions. Doctors may recommend this procedure for a number of  different reasons. The most common reason is to evaluate chest pain. Chest pain can be a symptom of coronary artery disease (CAD), and cardiac catheterization can show whether plaque is narrowing or blocking your heart's arteries. This procedure is also used to evaluate the valves, as well as measure the blood flow and oxygen levels in different parts of your heart. For further information please visit [email or web address] Please follow instruction sheet, as given.   Follow-Up: At [facility], you and your health needs are our priority.  As part of our continuing mission to provide you with exceptional heart care, we have created designated Provider Care Teams.  These Care Teams include your primary Cardiologist (physician) and Advanced Practice Providers (APPs -  Physician Assistants and Nurse Practitioners) who all work together to provide you with the care you need, when you need it.  We recommend signing up for the patient portal called "MyChart".  Sign up information is provided on this After Visit Summary.  MyChart is used to connect with patients for Virtual Visits (Telemedicine).  Patients are able to view lab/test results, encounter notes, upcoming appointments, etc.  Non-urgent messages can be sent to your provider as well.   To learn more about what you can do with MyChart, go to [email or web address].    Your next appointment:   June    The format for your next appointment:   In Person  Provider:   Lennie Odor, MD   Other Instructions    [facility] GROUP [facility] CARDIOVASCULAR DIVISION [facility] [address] [place] [address] Dept: [phone] Loc: [phone]  Marc Gates  03/09/2021  You are scheduled for a Cardiac Catheterization on Tuesday, May 31 with Dr. Peter Swaziland.  1. Please arrive at the [facility] (Main Entrance A) at [facility]: [address] [place], [place] [address] at 5:30 AM (This time is  two hours before your procedure to ensure your preparation). Free valet parking service is available.   Special note: Every effort is made to have your procedure done on time. Please understand that emergencies sometimes delay scheduled procedures.  2. Diet: Do not eat solid foods after midnight.  The patient may have clear liquids until 5am upon the day of the procedure.  3. Labs: You will need to have blood drawn today CBC, BMET.   4. Medication instructions in preparation for your procedure:   Contrast Allergy: No   Do not take Diabetes Med Glucophage (Metformin) on the day of the procedure and HOLD 48 HOURS AFTER THE PROCEDURE.  On the morning of your procedure, take your Aspirin and any morning medicines NOT listed above.  You may use sips of water.  5. Plan for one night stay--bring personal belongings. 6. Bring a current list of your medications and current insurance cards. 7. You MUST have a responsible person to drive you home. 8. Someone MUST be with you the first 24 hours after you arrive home or your discharge  will be delayed. 9. Please wear clothes that are easy to get on and off and wear slip-on shoes.  Thank you for allowing [name] to care for you!   -- [facility]      Time Spent with Patient: I have spent a total of 35 minutes with patient reviewing hospital notes, telemetry, EKGs, labs and examining the patient as well as establishing an assessment and plan that was discussed with the patient.  > 50% of time was spent in direct patient care.  Signed, Lenna Gilford. Flora Lipps, MD, [facility]  [facility]  [address], [address] [place], [place] [address] [phone]  03/09/2021 10:47 AM

## 2021-03-09 ENCOUNTER — Ambulatory Visit: Payer: Medicare PPO | Admitting: Cardiovascular Disease

## 2021-03-09 ENCOUNTER — Encounter: Payer: Self-pay | Admitting: Cardiovascular Disease

## 2021-03-09 VITALS — BP 130/71 | HR 84 | Ht 70.0 in | Wt 235.0 lb

## 2021-03-09 DIAGNOSIS — E782 Mixed hyperlipidemia: Secondary | ICD-10-CM

## 2021-03-09 DIAGNOSIS — R931 Abnormal findings on diagnostic imaging of heart and coronary circulation: Secondary | ICD-10-CM

## 2021-03-09 DIAGNOSIS — I2510 Atherosclerotic heart disease of native coronary artery without angina pectoris: Secondary | ICD-10-CM | POA: Diagnosis not present

## 2021-03-09 DIAGNOSIS — R9439 Abnormal result of other cardiovascular function study: Secondary | ICD-10-CM | POA: Diagnosis not present

## 2021-03-09 LAB — BASIC METABOLIC PANEL
BUN/Creatinine Ratio: 14 (ref 10–24)
BUN: 14 mg/dL (ref 8–27)
CO2: 19 mmol/L — ABNORMAL LOW (ref 20–29)
Calcium: 9.8 mg/dL (ref 8.6–10.2)
Chloride: 101 mmol/L (ref 96–106)
Creatinine, Ser: 0.98 mg/dL (ref 0.76–1.27)
Glucose: 102 mg/dL — ABNORMAL HIGH (ref 65–99)
Potassium: 4.5 mmol/L (ref 3.5–5.2)
Sodium: 138 mmol/L (ref 134–144)
eGFR: 83 mL/min/{1.73_m2} (ref >59)

## 2021-03-09 LAB — CBC
Hematocrit: 45.7 % (ref 37.5–51.0)
Hemoglobin: 15.2 g/dL (ref 13.0–17.7)
MCH: 30.4 pg (ref 26.6–33.0)
MCHC: 33.3 g/dL (ref 31.5–35.7)
MCV: 91 fL (ref 79–97)
Platelets: 226 10*3/uL (ref 150–450)
RBC: 5 x10E6/uL (ref 4.14–5.80)
RDW: 11.9 % (ref 11.6–15.4)
WBC: 9.8 10*3/uL (ref 3.4–10.8)

## 2021-03-09 MED ORDER — NITROGLYCERIN 0.4 MG SL SUBL: 0.4000 mg | SUBLINGUAL_TABLET | SUBLINGUAL | 3 refills | Status: DC | PRN

## 2021-03-09 NOTE — Patient Instructions (Addendum)
Medication Instructions:  Take Nitroglycerin as instructed  *If you need a refill on your cardiac medications before your next appointment, please call your pharmacy*   Lab Work: BMET, CBC today COVID TESTING: 05/27 at 12:00 PM- [address], [place]   If you have labs (blood work) drawn today and your tests are completely normal, you will receive your results only by: [name] MyChart Message (if you have MyChart) OR . A paper copy in the mail If you have any lab test that is abnormal or we need to change your treatment, we will call you to review the results.   Testing/Procedures: Your physician has requested that you have a cardiac catheterization. Cardiac catheterization is used to diagnose and/or treat various heart conditions. Doctors may recommend this procedure for a number of different reasons. The most common reason is to evaluate chest pain. Chest pain can be a symptom of coronary artery disease (CAD), and cardiac catheterization can show whether plaque is narrowing or blocking your heart's arteries. This procedure is also used to evaluate the valves, as well as measure the blood flow and oxygen levels in different parts of your heart. For further information please visit [email or web address] Please follow instruction sheet, as given.   Follow-Up: At [facility], you and your health needs are our priority.  As part of our continuing mission to provide you with exceptional heart care, we have created designated Provider Care Teams.  These Care Teams include your primary Cardiologist (physician) and Advanced Practice Providers (APPs -  Physician Assistants and Nurse Practitioners) who all work together to provide you with the care you need, when you need it.  We recommend signing up for the patient portal called "MyChart".  Sign up information is provided on this After Visit Summary.  MyChart is used to connect with patients for Virtual Visits (Telemedicine).  Patients are  able to view lab/test results, encounter notes, upcoming appointments, etc.  Non-urgent messages can be sent to your provider as well.   To learn more about what you can do with MyChart, go to [email or web address].    Your next appointment:   June    The format for your next appointment:   In Person  Provider:   Lennie Odor, MD   Other Instructions    [facility] CARDIOVASCULAR DIVISION [facility] [address] [place] [address] Dept: [phone] Loc: [phone]  Marc Gates  03/09/2021  You are scheduled for a Cardiac Catheterization on Tuesday, May 31 with Dr. Peter Swaziland.  1. Please arrive at the [facility] (Main Entrance A) at [facility]: [address] [place], [place] [address] at 5:30 AM (This time is two hours before your procedure to ensure your preparation). Free valet parking service is available.   Special note: Every effort is made to have your procedure done on time. Please understand that emergencies sometimes delay scheduled procedures.  2. Diet: Do not eat solid foods after midnight.  The patient may have clear liquids until 5am upon the day of the procedure.  3. Labs: You will need to have blood drawn today CBC, BMET.   4. Medication instructions in preparation for your procedure:   Contrast Allergy: No   Do not take Diabetes Med Glucophage (Metformin) on the day of the procedure and HOLD 48 HOURS AFTER THE PROCEDURE.  On the morning of your procedure, take your Aspirin and any morning medicines NOT listed above.  You may use  sips of water.  5. Plan for one night stay--bring personal belongings. 6. Bring a current list of your medications and current insurance cards. 7. You MUST have a responsible person to drive you home. 8. Someone MUST be with you the first 24 hours after you arrive home or your discharge will be delayed. 9. Please wear clothes that are easy to get on and  off and wear slip-on shoes.  Thank you for allowing [name] to care for you!   -- [place] [facility]

## 2021-03-10 NOTE — Progress Notes (Signed)
Chief Complaint:   OBESITY Marc Gates is here to discuss his progress with his obesity treatment plan along with follow-up of his obesity related diagnoses. Marc Gates is on keeping a food journal and adhering to recommended goals of 1600 calories and 100+ grams of protein daily and states he is following his eating plan approximately 75% of the time. Marc Gates states he is walking 10,000 steps 5 times per week.   Today's visit was #: 14 Starting weight: 263 lbs Starting date: 05/27/2020 Today's weight: 229 lbs Today's date: 03/08/2021 Total lbs lost to date: 34 Total lbs lost since last in-office visit: 2  Interim History: Marc Gates is down 2 lbs and he is doing well overall. He has been to [place].  Subjective:   1. Other hyperlipidemia Marc Gates is currently taking Lipitor.  2. Vitamin D deficiency Marc Gates is taking Vit D, and he denies nausea, vomiting, or muscle weakness.  Assessment/Plan:   1. Other hyperlipidemia Cardiovascular risk and specific lipid/LDL goals reviewed. We discussed several lifestyle modifications today. Marc Gates will continue Lipitor and will follow up with his Cardiologist. He will continue to work on diet, exercise and weight loss efforts. Orders and follow up as documented in patient record.   Counseling Intensive lifestyle modifications are the first line treatment for this issue. . Dietary changes: Increase soluble fiber. Decrease simple carbohydrates. . Exercise changes: Moderate to vigorous-intensity aerobic activity 150 minutes per week if tolerated. . Lipid-lowering medications: see documented in medical record.  2. Vitamin D deficiency Low Vitamin D level contributes to fatigue and are associated with obesity, breast, and colon cancer. We will refill prescription Vitamin D for 1 month. Marc Gates will follow-up for routine testing of Vitamin D, at least 2-3 times per year to avoid over-replacement.  - Vitamin D, Ergocalciferol, (DRISDOL) 1.25 MG (50000 UNIT) CAPS capsule;  Take 1 capsule (50,000 Units total) by mouth every 14 (fourteen) days.  Dispense: 2 capsule; Refill: 0  3. Obesity, current BMI 32 Marc Gates is currently in the action stage of change. As such, his goal is to continue with weight loss efforts. He has agreed to the Category 3 Plan.   Exercise goals: As is.  Behavioral modification strategies: increasing lean protein intake, decreasing simple carbohydrates, increasing vegetables, increasing water intake, decreasing eating out, no skipping meals, meal planning and cooking strategies, keeping healthy foods in the home and planning for success.  Marc Gates has agreed to follow-up with our clinic in 2 weeks. He was informed of the importance of frequent follow-up visits to maximize his success with intensive lifestyle modifications for his multiple health conditions.   Objective:   Blood pressure 127/79, pulse 75, temperature 97.7 F (36.5 C), height 5\' 10"  (1.778 m), weight 229 lb (103.9 kg), SpO2 96 %. Body mass index is 32.86 kg/m.  General: Cooperative, alert, well developed, in no acute distress. HEENT: Conjunctivae and lids unremarkable. Cardiovascular: Regular rhythm.  Lungs: Normal work of breathing. Neurologic: No focal deficits.   Lab Results  Component Value Date   CREATININE 0.98 03/09/2021   BUN 14 03/09/2021   NA 138 03/09/2021   K 4.5 03/09/2021   CL 101 03/09/2021   CO2 19 (L) 03/09/2021   Lab Results  Component Value Date   ALT 22 12/24/2020   AST 22 12/24/2020   ALKPHOS 90 12/24/2020   BILITOT 0.4 12/24/2020   Lab Results  Component Value Date   HGBA1C 5.9 (H) 12/24/2020   HGBA1C 6.2 (H) 07/20/2020   HGBA1C 7.5 (H) 04/03/2019  Lab Results  Component Value Date   INSULIN 12.0 12/24/2020   INSULIN 40.2 (H) 07/20/2020   Lab Results  Component Value Date   TSH 1.260 05/27/2020   Lab Results  Component Value Date   CHOL 102 05/27/2020   HDL 47 05/27/2020   LDLCALC 30 05/27/2020   TRIG 146 05/27/2020   Lab  Results  Component Value Date   WBC 9.8 03/09/2021   HGB 15.2 03/09/2021   HCT 45.7 03/09/2021   MCV 91 03/09/2021   PLT 226 03/09/2021   No results found for: IRON, TIBC, FERRITIN  Obesity Behavioral Intervention:   Approximately 15 minutes were spent on the discussion below.  ASK: We discussed the diagnosis of obesity with Marc Gates today and Marc Gates agreed to give [name] permission to discuss obesity behavioral modification therapy today.  ASSESS: Marc Gates has the diagnosis of obesity and his BMI today is 32.86. Marc Gates is in the action stage of change.   ADVISE: Marc Gates was educated on the multiple health risks of obesity as well as the benefit of weight loss to improve his health. He was advised of the need for long term treatment and the importance of lifestyle modifications to improve his current health and to decrease his risk of future health problems.  AGREE: Multiple dietary modification options and treatment options were discussed and Marc Gates agreed to follow the recommendations documented in the above note.  ARRANGE: Marc Gates was educated on the importance of frequent visits to treat obesity as outlined per CMS and USPSTF guidelines and agreed to schedule his next follow up appointment today.  Attestation Statements:   Reviewed by clinician on day of visit: allergies, medications, problem list, medical history, surgical history, family history, social history, and previous encounter notes.   [name], am acting as Energy manager for [employer], DO.  I have reviewed the above documentation for accuracy and completeness, and I agree with the above. [name], DO

## 2021-03-15 DIAGNOSIS — J301 Allergic rhinitis due to pollen: Secondary | ICD-10-CM | POA: Diagnosis not present

## 2021-03-15 DIAGNOSIS — M170 Bilateral primary osteoarthritis of knee: Secondary | ICD-10-CM | POA: Diagnosis not present

## 2021-03-15 DIAGNOSIS — J3089 Other allergic rhinitis: Secondary | ICD-10-CM | POA: Diagnosis not present

## 2021-03-22 DIAGNOSIS — J3089 Other allergic rhinitis: Secondary | ICD-10-CM | POA: Diagnosis not present

## 2021-03-22 DIAGNOSIS — J301 Allergic rhinitis due to pollen: Secondary | ICD-10-CM | POA: Diagnosis not present

## 2021-03-30 DIAGNOSIS — J301 Allergic rhinitis due to pollen: Secondary | ICD-10-CM | POA: Diagnosis not present

## 2021-04-01 ENCOUNTER — Encounter (INDEPENDENT_AMBULATORY_CARE_PROVIDER_SITE_OTHER): Payer: Self-pay | Admitting: Bariatrics

## 2021-04-01 ENCOUNTER — Ambulatory Visit (INDEPENDENT_AMBULATORY_CARE_PROVIDER_SITE_OTHER): Payer: Medicare PPO | Admitting: Bariatrics

## 2021-04-01 ENCOUNTER — Telehealth: Payer: Self-pay | Admitting: *Deleted

## 2021-04-01 ENCOUNTER — Other Ambulatory Visit: Payer: Self-pay

## 2021-04-01 VITALS — BP 125/70 | HR 62 | Temp 97.7°F | Ht 70.0 in | Wt 226.0 lb

## 2021-04-01 DIAGNOSIS — I152 Hypertension secondary to endocrine disorders: Secondary | ICD-10-CM | POA: Diagnosis not present

## 2021-04-01 DIAGNOSIS — E1159 Type 2 diabetes mellitus with other circulatory complications: Secondary | ICD-10-CM

## 2021-04-01 DIAGNOSIS — Z6837 Body mass index (BMI) 37.0-37.9, adult: Secondary | ICD-10-CM

## 2021-04-01 DIAGNOSIS — E1169 Type 2 diabetes mellitus with other specified complication: Secondary | ICD-10-CM | POA: Diagnosis not present

## 2021-04-01 NOTE — Telephone Encounter (Signed)
Pt contacted pre-catheterization scheduled at [facility] for: Tuesday Apr 06, 2021 7:30 AM Verified arrival time and place: [facility]tr[facility]) at: 5:30 AM   No solid food after midnight prior to cath, clear liquids until 5 AM day of procedure.  Hold: Metformin-day of procedure and 48 hours post procedure  Except hold medications AM meds can be  taken pre-cath with sips of water including: ASA 81 mg   Confirmed patient has responsible adult to drive home post procedure and be with patient first 24 hours after arriving home: yes  You are allowed ONE visitor in the waiting room during the time you are at the hospital for your procedure. Both you and your visitor must wear a mask once you enter the hospital.   Patient reports does not currently have any symptoms concerning for COVID-19 and no household members with COVID-19 like illness.      Reviewed procedure/mask/visitor instructions with patient.

## 2021-04-02 ENCOUNTER — Other Ambulatory Visit (HOSPITAL_COMMUNITY): Payer: Medicare PPO

## 2021-04-06 ENCOUNTER — Other Ambulatory Visit: Payer: Self-pay

## 2021-04-06 ENCOUNTER — Ambulatory Visit (HOSPITAL_COMMUNITY)
Admission: RE | Admit: 2021-04-06 | Discharge: 2021-04-06 | Disposition: A | Discharge status: Home / Self Care | Payer: Medicare PPO | Attending: Cardiology | Admitting: Cardiology

## 2021-04-06 ENCOUNTER — Encounter (HOSPITAL_COMMUNITY)
Admission: RE | Disposition: A | Discharge status: Home / Self Care | Payer: Self-pay | Source: Home / Self Care | Attending: Cardiology

## 2021-04-06 ENCOUNTER — Encounter (HOSPITAL_COMMUNITY): Payer: Self-pay | Admitting: Cardiology

## 2021-04-06 DIAGNOSIS — I2510 Atherosclerotic heart disease of native coronary artery without angina pectoris: Secondary | ICD-10-CM | POA: Diagnosis not present

## 2021-04-06 DIAGNOSIS — E1159 Type 2 diabetes mellitus with other circulatory complications: Secondary | ICD-10-CM | POA: Diagnosis present

## 2021-04-06 DIAGNOSIS — E1169 Type 2 diabetes mellitus with other specified complication: Secondary | ICD-10-CM | POA: Diagnosis present

## 2021-04-06 DIAGNOSIS — Z79899 Other long term (current) drug therapy: Secondary | ICD-10-CM | POA: Insufficient documentation

## 2021-04-06 DIAGNOSIS — Z7984 Long term (current) use of oral hypoglycemic drugs: Secondary | ICD-10-CM | POA: Insufficient documentation

## 2021-04-06 DIAGNOSIS — Z87891 Personal history of nicotine dependence: Secondary | ICD-10-CM | POA: Insufficient documentation

## 2021-04-06 DIAGNOSIS — G4733 Obstructive sleep apnea (adult) (pediatric): Secondary | ICD-10-CM

## 2021-04-06 DIAGNOSIS — E119 Type 2 diabetes mellitus without complications: Secondary | ICD-10-CM | POA: Insufficient documentation

## 2021-04-06 DIAGNOSIS — E782 Mixed hyperlipidemia: Secondary | ICD-10-CM | POA: Diagnosis not present

## 2021-04-06 DIAGNOSIS — R9439 Abnormal result of other cardiovascular function study: Secondary | ICD-10-CM | POA: Diagnosis not present

## 2021-04-06 DIAGNOSIS — R0602 Shortness of breath: Secondary | ICD-10-CM | POA: Diagnosis not present

## 2021-04-06 DIAGNOSIS — I152 Hypertension secondary to endocrine disorders: Secondary | ICD-10-CM | POA: Diagnosis present

## 2021-04-06 DIAGNOSIS — Z7982 Long term (current) use of aspirin: Secondary | ICD-10-CM | POA: Diagnosis not present

## 2021-04-06 HISTORY — PX: LEFT HEART CATH AND CORONARY ANGIOGRAPHY: CATH118249

## 2021-04-06 LAB — POCT I-STAT, CHEM 8
BUN: 19 mg/dL (ref 8–23)
Calcium, Ion: 1.24 mmol/L (ref 1.15–1.40)
Chloride: 106 mmol/L (ref 98–111)
Creatinine, Ser: 0.7 mg/dL (ref 0.61–1.24)
Glucose, Bld: 106 mg/dL — ABNORMAL HIGH (ref 70–99)
HCT: 42 % (ref 39.0–52.0)
Hemoglobin: 14.3 g/dL (ref 13.0–17.0)
Potassium: 3.8 mmol/L (ref 3.5–5.1)
Sodium: 141 mmol/L (ref 135–145)
TCO2: 22 mmol/L (ref 22–32)

## 2021-04-06 LAB — GLUCOSE, CAPILLARY: Glucose-Capillary: 117 mg/dL — ABNORMAL HIGH (ref 70–99)

## 2021-04-06 SURGERY — LEFT HEART CATH AND CORONARY ANGIOGRAPHY
Anesthesia: LOCAL

## 2021-04-06 MED ORDER — FENTANYL CITRATE (PF) 100 MCG/2ML IJ SOLN
INTRAMUSCULAR | Status: AC
  Filled 2021-04-06: qty 2

## 2021-04-06 MED ORDER — IOHEXOL 350 MG/ML SOLN
INTRAVENOUS | Status: DC | PRN
  Administered 2021-04-06: 70 mL

## 2021-04-06 MED ORDER — SODIUM CHLORIDE 0.9% FLUSH: 3.0000 mL | Freq: Two times a day (BID) | INTRAVENOUS | Status: DC

## 2021-04-06 MED ORDER — FENTANYL CITRATE (PF) 100 MCG/2ML IJ SOLN
INTRAMUSCULAR | Status: DC | PRN
  Administered 2021-04-06: 25 ug via INTRAVENOUS

## 2021-04-06 MED ORDER — MIDAZOLAM HCL 2 MG/2ML IJ SOLN
INTRAMUSCULAR | Status: DC | PRN
  Administered 2021-04-06: 1 mg via INTRAVENOUS

## 2021-04-06 MED ORDER — HEPARIN SODIUM (PORCINE) 1000 UNIT/ML IJ SOLN
INTRAMUSCULAR | Status: AC
  Filled 2021-04-06: qty 1

## 2021-04-06 MED ORDER — LIDOCAINE HCL (PF) 1 % IJ SOLN
INTRAMUSCULAR | Status: DC | PRN
  Administered 2021-04-06: 2 mL

## 2021-04-06 MED ORDER — MIDAZOLAM HCL 2 MG/2ML IJ SOLN
INTRAMUSCULAR | Status: AC
  Filled 2021-04-06: qty 2

## 2021-04-06 MED ORDER — HEPARIN (PORCINE) IN NACL 1000-0.9 UT/500ML-% IV SOLN
INTRAVENOUS | Status: AC
  Filled 2021-04-06: qty 500

## 2021-04-06 MED ORDER — HEPARIN SODIUM (PORCINE) 1000 UNIT/ML IJ SOLN
INTRAMUSCULAR | Status: DC | PRN
  Administered 2021-04-06: 5000 [IU] via INTRAVENOUS

## 2021-04-06 MED ORDER — SODIUM CHLORIDE 0.9% FLUSH: 3.0000 mL | INTRAVENOUS | Status: DC | PRN

## 2021-04-06 MED ORDER — ASPIRIN 81 MG PO CHEW: 81.0000 mg | CHEWABLE_TABLET | ORAL | Status: AC

## 2021-04-06 MED ORDER — SODIUM CHLORIDE 0.9 % IV SOLN: 250.0000 mL | INTRAVENOUS | Status: DC | PRN

## 2021-04-06 MED ORDER — VERAPAMIL HCL 2.5 MG/ML IV SOLN
INTRAVENOUS | Status: AC
  Filled 2021-04-06: qty 2

## 2021-04-06 MED ORDER — LIDOCAINE HCL (PF) 1 % IJ SOLN
INTRAMUSCULAR | Status: AC
  Filled 2021-04-06: qty 30

## 2021-04-06 MED ORDER — HEPARIN (PORCINE) IN NACL 1000-0.9 UT/500ML-% IV SOLN
INTRAVENOUS | Status: DC | PRN
  Administered 2021-04-06 (×2): 500 mL

## 2021-04-06 MED ORDER — METFORMIN HCL ER 500 MG PO TB24: 1000.0000 mg | ORAL_TABLET | Freq: Two times a day (BID) | ORAL | Status: AC

## 2021-04-06 MED ORDER — SODIUM CHLORIDE 0.9 % WEIGHT BASED INFUSION: 3.0000 mL/kg/h | INTRAVENOUS | Status: AC

## 2021-04-06 MED ORDER — VERAPAMIL HCL 2.5 MG/ML IV SOLN
INTRAVENOUS | Status: DC | PRN
  Administered 2021-04-06: 10 mL via INTRA_ARTERIAL

## 2021-04-06 MED ORDER — SODIUM CHLORIDE 0.9 % WEIGHT BASED INFUSION: 1.0000 mL/kg/h | INTRAVENOUS | Status: DC

## 2021-04-06 SURGICAL SUPPLY — 11 items
CATH 5FR JL3.5 JR4 ANG PIG MP (CATHETERS) ×2 IMPLANT
CATH LAUNCHER 5F RADR (CATHETERS) ×1 IMPLANT
CATHETER LAUNCHER 5F RADR (CATHETERS) ×2
DEVICE RAD COMP TR BAND LRG (VASCULAR PRODUCTS) ×2 IMPLANT
GLIDESHEATH SLEND SS 6F .021 (SHEATH) ×2 IMPLANT
GUIDEWIRE INQWIRE 1.5J.035X260 (WIRE) ×1 IMPLANT
INQWIRE 1.5J .035X260CM (WIRE) ×2
KIT HEART LEFT (KITS) ×2 IMPLANT
PACK CARDIAC CATHETERIZATION (CUSTOM PROCEDURE TRAY) ×2 IMPLANT
TRANSDUCER W/STOPCOCK (MISCELLANEOUS) ×2 IMPLANT
TUBING CIL FLEX 10 FLL-RA (TUBING) ×2 IMPLANT

## 2021-04-06 NOTE — Interval H&P Note (Signed)
History and Physical Interval Note:  04/06/2021 7:17 AM  Marc Gates  has presented today for surgery, with the diagnosis of abnormal stress.  The various methods of treatment have been discussed with the patient and family. After consideration of risks, benefits and other options for treatment, the patient has consented to  Procedure(s): LEFT HEART CATH AND CORONARY ANGIOGRAPHY (N/A) as a surgical intervention.  The patient's history has been reviewed, patient examined, no change in status, stable for surgery.  I have reviewed the patient's chart and labs.  Questions were answered to the patient's satisfaction.   Cath Lab Visit (complete for each Cath Lab visit)  Clinical Evaluation Leading to the Procedure:   ACS: No.  Non-ACS:    Anginal Classification: CCS I  Anti-ischemic medical therapy: No Therapy  Non-Invasive Test Results: Intermediate-risk stress test findings: cardiac mortality 1-3%/year  Prior CABG: No previous CABG        Marc Gates [facility] 04/06/2021 7:17 AM

## 2021-04-06 NOTE — Discharge Instructions (Signed)
Radial Site Care  This sheet gives you information about how to care for yourself after your procedure. Your health care provider may also give you more specific instructions. If you have problems or questions, contact your health care provider. What can I expect after the procedure? After the procedure, it is common to have:  Bruising and tenderness at the catheter insertion area. Follow these instructions at home: Medicines  Take over-the-counter and prescription medicines only as told by your health care provider. Insertion site care  Follow instructions from your health care provider about how to take care of your insertion site. Make sure you: ? Wash your hands with soap and water before you change your bandage (dressing). If soap and water are not available, use hand sanitizer. ? Change your dressing as told by your health care provider. ? Leave stitches (sutures), skin glue, or adhesive strips in place. These skin closures may need to stay in place for 2 weeks or longer. If adhesive strip edges start to loosen and curl up, you may trim the loose edges. Do not remove adhesive strips completely unless your health care provider tells you to do that.  Check your insertion site every day for signs of infection. Check for: ? Redness, swelling, or pain. ? Fluid or blood. ? Pus or a bad smell. ? Warmth.  Do not take baths, swim, or use a hot tub until your health care provider approves.  You may shower 24-48 hours after the procedure, or as directed by your health care provider. ? Remove the dressing and gently wash the site with plain soap and water. ? Pat the area dry with a clean towel. ? Do not rub the site. That could cause bleeding.  Do not apply powder or lotion to the site. Activity  For 24 hours after the procedure, or as directed by your health care provider: ? Do not flex or bend the affected arm. ? Do not push or pull heavy objects with the affected arm. ? Do not drive  yourself home from the hospital or clinic. You may drive 24 hours after the procedure unless your health care provider tells you not to. ? Do not operate machinery or power tools.  Do not lift anything that is heavier than 10 lb (4.5 kg), or the limit that you are told, until your health care provider says that it is safe.  Ask your health care provider when it is okay to: ? Return to work or school. ? Resume usual physical activities or sports. ? Resume sexual activity.   General instructions  If the catheter site starts to bleed, raise your arm and put firm pressure on the site. If the bleeding does not stop, get help right away. This is a medical emergency.  If you went home on the same day as your procedure, a responsible adult should be with you for the first 24 hours after you arrive home.  Keep all follow-up visits as told by your health care provider. This is important. Contact a health care provider if:  You have a fever.  You have redness, swelling, or yellow drainage around your insertion site. Get help right away if:  You have unusual pain at the radial site.  The catheter insertion area swells very fast.  The insertion area is bleeding, and the bleeding does not stop when you hold steady pressure on the area.  Your arm or hand becomes pale, cool, tingly, or numb. These symptoms may represent a serious   problem that is an emergency. Do not wait to see if the symptoms will go away. Get medical help right away. Call your local emergency services (911 in the [place] Do not drive yourself to the hospital. Summary  After the procedure, it is common to have bruising and tenderness at the site.  Follow instructions from your health care provider about how to take care of your radial site wound. Check the wound every day for signs of infection.  Do not lift anything that is heavier than 10 lb (4.5 kg), or the limit that you are told, until your health care provider says that it  is safe. This information is not intended to replace advice given to you by your health care provider. Make sure you discuss any questions you have with your health care provider. Document Revised: 11/29/2017 Document Reviewed: 11/29/2017 Elsevier Patient Education  2021 [employer] Inc[employer]  

## 2021-04-07 ENCOUNTER — Ambulatory Visit: Payer: Medicare PPO | Admitting: Cardiovascular Disease

## 2021-04-07 DIAGNOSIS — J301 Allergic rhinitis due to pollen: Secondary | ICD-10-CM | POA: Diagnosis not present

## 2021-04-07 DIAGNOSIS — J3089 Other allergic rhinitis: Secondary | ICD-10-CM | POA: Diagnosis not present

## 2021-04-07 NOTE — Progress Notes (Signed)
Chief Complaint:   OBESITY Marc Gates is here to discuss his progress with his obesity treatment plan along with follow-up of his obesity related diagnoses. [name] is on the Category 3 Plan and states he is following his eating plan approximately 70% of the time. [name] states he is walking 10,000 steps 5 times per week.   Today's visit was #: 15 Starting weight: 263 lbs Starting date: 05/27/2020 Today's weight: 226 lbs Today's date: 04/01/2021 Total lbs lost to date: 37 Total lbs lost since last in-office visit: 3  Interim History: Marc Gates is down another 3 lbs. He has a heart catheterization next week.    Subjective:   1. Type 2 diabetes mellitus with other specified complication, without long-term current use of insulin (HCC) Marc Gates is taking Trulicity and metformin.  2. Hypertension associated with type 2 diabetes mellitus (HCC) [name]'s blood pressure is stable.  Assessment/Plan:   1. Type 2 diabetes mellitus with other specified complication, without long-term current use of insulin (HCC) Marc Gates will continue his medications. Good blood sugar control is important to decrease the likelihood of diabetic complications such as nephropathy, neuropathy, limb loss, blindness, coronary artery disease, and death. Intensive lifestyle modification including diet, exercise and weight loss are the first line of treatment for diabetes.   2. Hypertension associated with type 2 diabetes mellitus (HCC) Marc Gates will continue his medications, and will continue working on healthy weight loss and exercise to improve blood pressure control. We will watch for signs of hypotension as he continues his lifestyle modifications.  3. Obesity, current BMI 32 [name] is currently in the action stage of change. As such, his goal is to continue with weight loss efforts. He has agreed to keeping a food journal and adhering to recommended goals of 1700-1800 calories and 119 grams of protein daily.   Exercise goals: As  is.  Behavioral modification strategies: increasing lean protein intake, decreasing simple carbohydrates, increasing vegetables, increasing water intake, decreasing eating out, no skipping meals, meal planning and cooking strategies, keeping healthy foods in the home and planning for success.  [name] has agreed to follow-up with our clinic in 2 weeks. He was informed of the importance of frequent follow-up visits to maximize his success with intensive lifestyle modifications for his multiple health conditions.   Objective:   Blood pressure 125/70, pulse 62, temperature 97.7 F (36.5 C), height 5\' 10"  (1.778 m), weight 226 lb (102.5 kg), SpO2 96 %. Body mass index is 32.43 kg/m.  General: Cooperative, alert, well developed, in no acute distress. HEENT: Conjunctivae and lids unremarkable. Cardiovascular: Regular rhythm.  Lungs: Normal work of breathing. Neurologic: No focal deficits.   Lab Results  Component Value Date   CREATININE 0.70 04/06/2021   BUN 19 04/06/2021   NA 141 04/06/2021   K 3.8 04/06/2021   CL 106 04/06/2021   CO2 19 (L) 03/09/2021   Lab Results  Component Value Date   ALT 22 12/24/2020   AST 22 12/24/2020   ALKPHOS 90 12/24/2020   BILITOT 0.4 12/24/2020   Lab Results  Component Value Date   HGBA1C 5.9 (H) 12/24/2020   HGBA1C 6.2 (H) 07/20/2020   HGBA1C 7.5 (H) 04/03/2019   Lab Results  Component Value Date   INSULIN 12.0 12/24/2020   INSULIN 40.2 (H) 07/20/2020   Lab Results  Component Value Date   TSH 1.260 05/27/2020   Lab Results  Component Value Date   CHOL 102 05/27/2020   HDL 47 05/27/2020   LDLCALC 30  05/27/2020   TRIG 146 05/27/2020   Lab Results  Component Value Date   WBC 9.8 03/09/2021   HGB 14.3 04/06/2021   HCT 42.0 04/06/2021   MCV 91 03/09/2021   PLT 226 03/09/2021   No results found for: IRON, TIBC, FERRITIN  Obesity Behavioral Intervention:   Approximately 15 minutes were spent on the discussion below.  ASK: We  discussed the diagnosis of obesity with [name] today and [name] agreed to give [name] permission to discuss obesity behavioral modification therapy today.  ASSESS: Marc Gates has the diagnosis of obesity and his BMI today is 32.43. Marc Gates is in the action stage of change.   ADVISE: Marc Gates was educated on the multiple health risks of obesity as well as the benefit of weight loss to improve his health. He was advised of the need for long term treatment and the importance of lifestyle modifications to improve his current health and to decrease his risk of future health problems.  AGREE: Multiple dietary modification options and treatment options were discussed and Marc Gates agreed to follow the recommendations documented in the above note.  ARRANGE: Marc Gates was educated on the importance of frequent visits to treat obesity as outlined per CMS and USPSTF guidelines and agreed to schedule his next follow up appointment today.  Attestation Statements:   Reviewed by clinician on day of visit: allergies, medications, problem list, medical history, surgical history, family history, social history, and previous encounter notes.   [name], am acting as Energy manager for [employer], DO.  I have reviewed the above documentation for accuracy and completeness, and I agree with the above. -   Current Outpatient Medications  Medication Sig Dispense Refill  . albuterol (VENTOLIN HFA) 108 (90 Base) MCG/ACT inhaler Inhale 1-2 puffs into the lungs every 4 (four) hours as needed for shortness of breath.    . ALPRAZolam (XANAX) 0.25 MG tablet Take 1 tablet (0.25 mg total) by mouth 2 (two) times daily as needed for anxiety. 30 tablet 0  . amLODipine-valsartan (EXFORGE) 5-160 MG tablet Take 1 tablet by mouth daily.    [name] aspirin EC 81 MG tablet Take 81 mg by mouth daily.    [name] atorvastatin (LIPITOR) 20 MG tablet Take 20 mg by mouth every evening.    [name] azelastine (ASTELIN) 0.1 % nasal spray Place 1 spray into both  nostrils 2 (two) times daily as needed for rhinitis. Use in each nostril as directed    . busPIRone (BUSPAR) 10 MG tablet Take 2 tablets (20 mg total) by mouth 2 (two) times daily. 540 tablet 1  . chlorpheniramine (CHLOR-TRIMETON) 4 MG tablet Take 4 mg by mouth 3 (three) times daily as needed for allergies.    . Dulaglutide (TRULICITY) 3 MG/0.5ML SOPN Inject 3 mg into the skin every Friday.    [name] EPINEPHrine 0.3 mg/0.3 mL IJ SOAJ injection Inject 0.3 mg into the muscle as needed for anaphylaxis.     . famotidine (PEPCID) 20 MG tablet Take 20 mg by mouth 2 (two) times daily.    . finasteride (PROSCAR) 5 MG tablet Take 5 mg by mouth daily.    . fluticasone (FLONASE) 50 MCG/ACT nasal spray Place 2 sprays into both nostrils daily as needed for allergies.    [name] glucose blood test strip 1 each by Other route as needed for other. Use as instructed    . levocetirizine (XYZAL) 5 MG tablet Take 5 mg by mouth every evening.    . mirabegron ER (MYRBETRIQ) 50 MG  TB24 tablet Take 50 mg by mouth daily.    . montelukast (SINGULAIR) 10 MG tablet Take 10 mg by mouth at bedtime.    . Multiple Vitamin (MULTIVITAMIN WITH MINERALS) TABS tablet Take 1 tablet by mouth daily.    . nitroGLYCERIN (NITROSTAT) 0.4 MG SL tablet Place 1 tablet (0.4 mg total) under the tongue every 5 (five) minutes as needed for chest pain. 25 tablet 3  . pantoprazole (PROTONIX) 40 MG tablet Take 40 mg by mouth 2 (two) times daily.     . tadalafil (CIALIS) 5 MG tablet Take 5 mg by mouth daily.    . Vitamin D, Ergocalciferol, (DRISDOL) 1.25 MG (50000 UNIT) CAPS capsule Take 1 capsule (50,000 Units total) by mouth every 14 (fourteen) days. 2 capsule 0  . zolpidem (AMBIEN) 10 MG tablet Take 10 mg by mouth at bedtime.     Melene Muller ON 04/09/2021] metFORMIN (GLUCOPHAGE-XR) 500 MG 24 hr tablet Take 2 tablets (1,000 mg total) by mouth 2 (two) times daily.     No current facility-administered medications for this visit.

## 2021-04-08 ENCOUNTER — Encounter (INDEPENDENT_AMBULATORY_CARE_PROVIDER_SITE_OTHER): Payer: Self-pay | Admitting: Bariatrics

## 2021-04-12 DIAGNOSIS — J301 Allergic rhinitis due to pollen: Secondary | ICD-10-CM | POA: Diagnosis not present

## 2021-04-15 DIAGNOSIS — H52223 Regular astigmatism, bilateral: Secondary | ICD-10-CM | POA: Diagnosis not present

## 2021-04-15 DIAGNOSIS — H524 Presbyopia: Secondary | ICD-10-CM | POA: Diagnosis not present

## 2021-04-15 DIAGNOSIS — H5213 Myopia, bilateral: Secondary | ICD-10-CM | POA: Diagnosis not present

## 2021-04-15 DIAGNOSIS — H5315 Visual distortions of shape and size: Secondary | ICD-10-CM | POA: Diagnosis not present

## 2021-04-19 ENCOUNTER — Ambulatory Visit: Payer: Medicare PPO | Admitting: Physician Assistant

## 2021-04-19 DIAGNOSIS — J301 Allergic rhinitis due to pollen: Secondary | ICD-10-CM | POA: Diagnosis not present

## 2021-04-22 DIAGNOSIS — K219 Gastro-esophageal reflux disease without esophagitis: Secondary | ICD-10-CM | POA: Diagnosis not present

## 2021-04-22 DIAGNOSIS — I700 Atherosclerosis of aorta: Secondary | ICD-10-CM | POA: Diagnosis not present

## 2021-04-22 DIAGNOSIS — Z7984 Long term (current) use of oral hypoglycemic drugs: Secondary | ICD-10-CM | POA: Diagnosis not present

## 2021-04-22 DIAGNOSIS — I10 Essential (primary) hypertension: Secondary | ICD-10-CM | POA: Diagnosis not present

## 2021-04-22 DIAGNOSIS — E1169 Type 2 diabetes mellitus with other specified complication: Secondary | ICD-10-CM | POA: Diagnosis not present

## 2021-04-22 DIAGNOSIS — I2510 Atherosclerotic heart disease of native coronary artery without angina pectoris: Secondary | ICD-10-CM | POA: Diagnosis not present

## 2021-04-26 ENCOUNTER — Ambulatory Visit (INDEPENDENT_AMBULATORY_CARE_PROVIDER_SITE_OTHER): Payer: Medicare PPO | Admitting: Bariatrics

## 2021-04-26 DIAGNOSIS — L84 Corns and callosities: Secondary | ICD-10-CM | POA: Diagnosis not present

## 2021-04-26 DIAGNOSIS — L602 Onychogryphosis: Secondary | ICD-10-CM | POA: Diagnosis not present

## 2021-04-26 DIAGNOSIS — J301 Allergic rhinitis due to pollen: Secondary | ICD-10-CM | POA: Diagnosis not present

## 2021-04-26 DIAGNOSIS — E1351 Other specified diabetes mellitus with diabetic peripheral angiopathy without gangrene: Secondary | ICD-10-CM | POA: Diagnosis not present

## 2021-04-29 DIAGNOSIS — J301 Allergic rhinitis due to pollen: Secondary | ICD-10-CM | POA: Diagnosis not present

## 2021-05-02 NOTE — Progress Notes (Signed)
[facility] at [facility]  Cardiology Office Note:   Date:  05/03/2021  NAME:  Marc Gates    MRN: [id number] DOB:  01/14/1951   PCP:  Kirby Funk, MD  Cardiologist:  None  Electrophysiologist:  None   Referring MD: Kirby Funk, MD   Chief Complaint  Patient presents with   Follow-up   History of Present Illness:   Marc Gates is a 70 y.o. male with a hx of elevated cac score, obesity, osa, HLD who presents for follow-up. Was seen last month for abnormal stress test that was ordered for SOB. Non-obstructive CAD on cath. Normal LVEDP.  He reports he is doing well.  Heart catheterization went well.  He is relieved to know that he does not have any significant heart blockages.  No issues from the right radial cath site.  Blood pressure well controlled.  Symptoms do not appear to be related to cardiac etiology.  Normal LVEDP.  We discussed that the mainstay of treatment is aspirin and statin therapy.  His most recent LDL cholesterol is stable and at goal.  He overall is doing well.  He will plan to see [name] yearly.  Problem List 1. DM -A1c 5.7 2. HTN 3. HLD -T chol 102, HDL 47, TG 146, LDL 30 4. Obesity -BMI 33 5. OSA 6. R empyema s/p thoracotomy (PNA) 7. Former tobacco abuse  -20 pack years  8. CAD -CAC 823 (83rd percentile) -abnormal MPI -LHC 25% pLAD 04/06/2021 -LVEDP normal 04/06/2021  Past Medical History: Past Medical History:  Diagnosis Date   Allergies    Anxiety    CPAP (continuous positive airway pressure) dependence    Diabetes mellitus without complication (HCC)    Edema of both lower extremities    Empyema (HCC)    GERD (gastroesophageal reflux disease)    Hepatitis 1980'S   HEPATITIS B   High cholesterol    Hypertension    Joint pain    Pneumonia    Sleep apnea    SOB (shortness of breath)     Past Surgical History: Past Surgical History:  Procedure Laterality Date   APPENDECTOMY  AGE 30 OR 13   COLONOSCOPY WITH  PROPOFOL N/A 08/13/2013   Procedure: COLONOSCOPY WITH PROPOFOL;  Surgeon: Charolett Bumpers, MD;  Location: [facility];  Service: Endoscopy;  Laterality: N/A;   DECORTICATION Right 04/03/2019   Procedure: DECORTICATION OF RIGHT LUNG;  Surgeon: Alleen Borne, MD;  Location: [facility] OR;  Service: Thoracic;  Laterality: Right;   EMPYEMA DRAINAGE Right 04/03/2019   Procedure: EMPYEMA DRAINAGE;  Surgeon: Alleen Borne, MD;  Location: [facility] OR;  Service: Thoracic;  Laterality: Right;   EYE SURGERY Left    retinal detachment and cataract surgery   HERNIA REPAIR  51 WEEKS OLD   IR THORACENTESIS ASP PLEURAL SPACE W/IMG GUIDE  03/07/2019   LEFT HEART CATH AND CORONARY ANGIOGRAPHY N/A 04/06/2021   Procedure: LEFT HEART CATH AND CORONARY ANGIOGRAPHY;  Surgeon: [place], Peter M, MD;  [facility] CV LAB;  Service: Cardiovascular;  Laterality: N/A;   PAIN PUMP IMPLANTATION  04/03/2019   Procedure: PLACEMENT OF ON-Q PAIN PUMP;  Surgeon: Alleen Borne, MD;  Location: [facility] OR;  Service: Thoracic;;   THORACOTOMY Right 04/03/2019   Procedure: THORACOTOMY MAJOR;  Surgeon: Alleen Borne, MD;  Location: [facility] OR;  Service: Thoracic;  Laterality: Right;    Current Medications: Current Meds  Medication Sig   albuterol (VENTOLIN HFA)  108 (90 Base) MCG/ACT inhaler Inhale 1-2 puffs into the lungs every 4 (four) hours as needed for shortness of breath.   ALPRAZolam (XANAX) 0.25 MG tablet Take 1 tablet (0.25 mg total) by mouth 2 (two) times daily as needed for anxiety.   amLODipine-valsartan (EXFORGE) 5-160 MG tablet Take 1 tablet by mouth daily.   aspirin EC 81 MG tablet Take 81 mg by mouth daily.   atorvastatin (LIPITOR) 20 MG tablet Take 20 mg by mouth every evening.   azelastine (ASTELIN) 0.1 % nasal spray Place 1 spray into both nostrils 2 (two) times daily as needed for rhinitis. Use in each nostril as directed   busPIRone (BUSPAR) 10 MG tablet Take 2 tablets (20 mg total) by mouth 2 (two) times daily.    chlorpheniramine (CHLOR-TRIMETON) 4 MG tablet Take 4 mg by mouth 3 (three) times daily as needed for allergies.   Dulaglutide (TRULICITY) 3 MG/0.5ML SOPN Inject 3 mg into the skin every Friday.   EPINEPHrine 0.3 mg/0.3 mL IJ SOAJ injection Inject 0.3 mg into the muscle as needed for anaphylaxis.    famotidine (PEPCID) 20 MG tablet Take 20 mg by mouth 2 (two) times daily.   finasteride (PROSCAR) 5 MG tablet Take 5 mg by mouth daily.   fluticasone (FLONASE) 50 MCG/ACT nasal spray Place 2 sprays into both nostrils daily as needed for allergies.   glucose blood test strip 1 each by Other route as needed for other. Use as instructed   levocetirizine (XYZAL) 5 MG tablet Take 5 mg by mouth every evening.   metFORMIN (GLUCOPHAGE-XR) 500 MG 24 hr tablet Take 2 tablets (1,000 mg total) by mouth 2 (two) times daily.   mirabegron ER (MYRBETRIQ) 50 MG TB24 tablet Take 50 mg by mouth daily.   montelukast (SINGULAIR) 10 MG tablet Take 10 mg by mouth at bedtime.   Multiple Vitamin (MULTIVITAMIN WITH MINERALS) TABS tablet Take 1 tablet by mouth daily.   nitroGLYCERIN (NITROSTAT) 0.4 MG SL tablet Place 1 tablet (0.4 mg total) under the tongue every 5 (five) minutes as needed for chest pain.   pantoprazole (PROTONIX) 40 MG tablet Take 40 mg by mouth 2 (two) times daily.    tadalafil (CIALIS) 5 MG tablet Take 5 mg by mouth daily.   Vitamin D, Ergocalciferol, (DRISDOL) 1.25 MG (50000 UNIT) CAPS capsule Take 1 capsule (50,000 Units total) by mouth every 14 (fourteen) days.   zolpidem (AMBIEN) 10 MG tablet Take 10 mg by mouth at bedtime.      Allergies:    Patient has no known allergies.   Social History: Social History   Socioeconomic History   Marital status: Single    Spouse name: Not on file   Number of children: 0   Years of education: Not on file   Highest education level: Not on file  Occupational History   Occupation: Retired Audiological scientist Professor  Tobacco Use   Smoking status: Former     Packs/day: 1.50    Years: 15.00    Pack years: 22.50    Types: Cigarettes   Smokeless tobacco: Never  Building services engineer Use: Never used  Substance and Sexual Activity   Alcohol use: Yes    Comment: rare   Drug use: No    Comment: QUIT SMOKING 25 YRS AGO   Sexual activity: Not on file  Other Topics Concern   Not on file  Social History Narrative   Not on file   Social Determinants of Health  Financial Resource Strain: Not on file  Food Insecurity: Not on file  Transportation Needs: Not on file  Physical Activity: Not on file  Stress: Not on file  Social Connections: Not on file     Family History: The patient's family history includes Anxiety disorder in his mother; Cancer in his father; Diabetes in his father; Heart disease in his mother; High Cholesterol in his father and mother; High blood pressure in his mother; Obesity in his mother.  ROS:   All other ROS reviewed and negative. Pertinent positives noted in the HPI.     EKGs/Labs/Other Studies Reviewed:   The following studies were personally reviewed by me today:  LHC 04/06/2021 Prox LAD to Mid LAD lesion is 25% stenosed. LV end diastolic pressure is normal.   1. Mild nonobstructive CAD 2. Normal LVEDP   Plan: risk factor modification.  Recent Labs: 05/27/2020: TSH 1.260 12/24/2020: ALT 22 03/09/2021: Platelets 226 04/06/2021: BUN 19; Creatinine, Ser 0.70; Hemoglobin 14.3; Potassium 3.8; Sodium 141   Recent Lipid Panel    Component Value Date/Time   CHOL 102 05/27/2020 1326   TRIG 146 05/27/2020 1326   HDL 47 05/27/2020 1326   LDLCALC 30 05/27/2020 1326    Physical Exam:   VS:  BP 120/76   Pulse (!) 101   Ht 5\' 10"  (1.778 m)   Wt 232 lb (105.2 kg)   SpO2 95%   BMI 33.29 kg/m    Wt Readings from Last 3 Encounters:  05/03/21 232 lb (105.2 kg)  04/06/21 226 lb (102.5 kg)  04/01/21 226 lb (102.5 kg)    General: Well nourished, well developed, in no acute distress Head: Atraumatic, normal size   Eyes: PEERLA, EOMI  Neck: Supple, no JVD Endocrine: No thryomegaly Cardiac: Normal S1, S2; RRR; no murmurs, rubs, or gallops Lungs: Clear to auscultation bilaterally, no wheezing, rhonchi or rales  Abd: Soft, nontender, no hepatomegaly  Ext: No edema, pulses 2+ Musculoskeletal: No deformities, BUE and BLE strength normal and equal Skin: Warm and dry, no rashes   Neuro: Alert and oriented to person, place, time, and situation, CNII-XII grossly intact, no focal deficits  Psych: Normal mood and affect   ASSESSMENT:   Marc Gates is a 70 y.o. male who presents for the following: 1. Coronary artery disease involving native coronary artery of native heart without angina pectoris   2. Agatston coronary artery calcium score greater than 400   3. Mixed hyperlipidemia   4. Primary hypertension     PLAN:   1. Coronary artery disease involving native coronary artery of native heart without angina pectoris 2. Agatston coronary artery calcium score greater than 400 3. Mixed hyperlipidemia 4. Primary hypertension -Evaluated for shortness of breath.  Found to have an abnormal myocardial perfusion imaging nuclear study.  Coronary calcium score was elevated 823.  He underwent left heart catheterization which demonstrated no significant CAD.  He had a 25% stenosis in the proximal LAD.  LVEDP was normal.  His symptoms of shortness of breath are likely pulmonary related.  There is no cardiac etiology.  Moving forward I have recommended aspirin 81 mg daily.  His most recent lipid profile shows a statin is at goal.  LVEDP was normal.  Would recommend to continue with aggressive lipid-lowering agents.  I recommended regular exercise as well as proper diet.  He will work on these.  He will see 78 yearly.  Disposition: Return in about 1 year (around 05/03/2022).  Medication Adjustments/Labs and Tests Ordered:  Current medicines are reviewed at length with the patient today.  Concerns regarding medicines are  outlined above.  No orders of the defined types were placed in this encounter.  No orders of the defined types were placed in this encounter.   Patient Instructions  Medication Instructions:  The current medical regimen is effective;  continue present plan and medications.  *If you need a refill on your cardiac medications before your next appointment, please call your pharmacy*   Follow-Up: At [facility], you and your health needs are our priority.  As part of our continuing mission to provide you with exceptional heart care, we have created designated Provider Care Teams.  These Care Teams include your primary Cardiologist (physician) and Advanced Practice Providers (APPs -  Physician Assistants and Nurse Practitioners) who all work together to provide you with the care you need, when you need it.  We recommend signing up for the patient portal called "MyChart".  Sign up information is provided on this After Visit Summary.  MyChart is used to connect with patients for Virtual Visits (Telemedicine).  Patients are able to view lab/test results, encounter notes, upcoming appointments, etc.  Non-urgent messages can be sent to your provider as well.   To learn more about what you can do with MyChart, go to [email or web address].    Your next appointment:   12 month(s)  The format for your next appointment:   In Person  Provider:   Lennie OdorWesley O'Neal, MD      Time Spent with Patient: I have spent a total of 25 minutes with patient reviewing hospital notes, telemetry, EKGs, labs and examining the patient as well as establishing an assessment and plan that was discussed with the patient.  > 50% of time was spent in direct patient care.  Signed, Lenna GilfordWesley T. Flora Lipps'Neal, MD, [facility]  [facility]  [address], [address] [place], [place] [address] [phone]  05/03/2021 2:14 PM

## 2021-05-03 ENCOUNTER — Ambulatory Visit (HOSPITAL_BASED_OUTPATIENT_CLINIC_OR_DEPARTMENT_OTHER): Payer: Medicare PPO | Admitting: Cardiovascular Disease

## 2021-05-03 ENCOUNTER — Other Ambulatory Visit: Payer: Self-pay

## 2021-05-03 ENCOUNTER — Encounter (HOSPITAL_BASED_OUTPATIENT_CLINIC_OR_DEPARTMENT_OTHER): Payer: Self-pay | Admitting: Cardiovascular Disease

## 2021-05-03 ENCOUNTER — Ambulatory Visit (INDEPENDENT_AMBULATORY_CARE_PROVIDER_SITE_OTHER): Payer: Medicare PPO | Admitting: Cardiovascular Disease

## 2021-05-03 VITALS — BP 120/76 | HR 101 | Ht 70.0 in | Wt 232.0 lb

## 2021-05-03 DIAGNOSIS — E782 Mixed hyperlipidemia: Secondary | ICD-10-CM | POA: Diagnosis not present

## 2021-05-03 DIAGNOSIS — I2510 Atherosclerotic heart disease of native coronary artery without angina pectoris: Secondary | ICD-10-CM | POA: Diagnosis not present

## 2021-05-03 DIAGNOSIS — R931 Abnormal findings on diagnostic imaging of heart and coronary circulation: Secondary | ICD-10-CM

## 2021-05-03 DIAGNOSIS — J301 Allergic rhinitis due to pollen: Secondary | ICD-10-CM | POA: Diagnosis not present

## 2021-05-03 DIAGNOSIS — I10 Essential (primary) hypertension: Secondary | ICD-10-CM

## 2021-05-03 NOTE — Patient Instructions (Signed)

## 2021-05-17 ENCOUNTER — Ambulatory Visit (INDEPENDENT_AMBULATORY_CARE_PROVIDER_SITE_OTHER): Payer: Medicare PPO | Admitting: Bariatrics

## 2021-05-17 DIAGNOSIS — E781 Pure hyperglyceridemia: Secondary | ICD-10-CM | POA: Diagnosis not present

## 2021-05-17 DIAGNOSIS — I2510 Atherosclerotic heart disease of native coronary artery without angina pectoris: Secondary | ICD-10-CM | POA: Diagnosis not present

## 2021-05-17 DIAGNOSIS — K219 Gastro-esophageal reflux disease without esophagitis: Secondary | ICD-10-CM | POA: Diagnosis not present

## 2021-05-17 DIAGNOSIS — E1169 Type 2 diabetes mellitus with other specified complication: Secondary | ICD-10-CM | POA: Diagnosis not present

## 2021-05-17 DIAGNOSIS — N400 Enlarged prostate without lower urinary tract symptoms: Secondary | ICD-10-CM | POA: Diagnosis not present

## 2021-05-17 DIAGNOSIS — E785 Hyperlipidemia, unspecified: Secondary | ICD-10-CM | POA: Diagnosis not present

## 2021-05-17 DIAGNOSIS — I10 Essential (primary) hypertension: Secondary | ICD-10-CM | POA: Diagnosis not present

## 2021-05-19 ENCOUNTER — Ambulatory Visit (INDEPENDENT_AMBULATORY_CARE_PROVIDER_SITE_OTHER): Payer: Medicare PPO | Admitting: Bariatrics

## 2021-05-24 DIAGNOSIS — J301 Allergic rhinitis due to pollen: Secondary | ICD-10-CM | POA: Diagnosis not present

## 2021-05-26 ENCOUNTER — Other Ambulatory Visit: Payer: Self-pay

## 2021-05-26 ENCOUNTER — Ambulatory Visit (INDEPENDENT_AMBULATORY_CARE_PROVIDER_SITE_OTHER): Payer: Medicare PPO | Admitting: Bariatrics

## 2021-05-26 ENCOUNTER — Encounter (INDEPENDENT_AMBULATORY_CARE_PROVIDER_SITE_OTHER): Payer: Self-pay | Admitting: Bariatrics

## 2021-05-26 VITALS — BP 128/82 | HR 76 | Temp 97.6°F | Ht 70.0 in | Wt 225.0 lb

## 2021-05-26 DIAGNOSIS — E7849 Other hyperlipidemia: Secondary | ICD-10-CM | POA: Diagnosis not present

## 2021-05-26 DIAGNOSIS — Z6833 Body mass index (BMI) 33.0-33.9, adult: Secondary | ICD-10-CM | POA: Diagnosis not present

## 2021-05-26 DIAGNOSIS — E669 Obesity, unspecified: Secondary | ICD-10-CM

## 2021-05-26 DIAGNOSIS — E6609 Other obesity due to excess calories: Secondary | ICD-10-CM | POA: Diagnosis not present

## 2021-05-26 DIAGNOSIS — E1169 Type 2 diabetes mellitus with other specified complication: Secondary | ICD-10-CM | POA: Diagnosis not present

## 2021-05-26 DIAGNOSIS — Z6837 Body mass index (BMI) 37.0-37.9, adult: Secondary | ICD-10-CM | POA: Diagnosis not present

## 2021-05-31 DIAGNOSIS — J301 Allergic rhinitis due to pollen: Secondary | ICD-10-CM | POA: Diagnosis not present

## 2021-06-01 DIAGNOSIS — G4733 Obstructive sleep apnea (adult) (pediatric): Secondary | ICD-10-CM | POA: Diagnosis not present

## 2021-06-02 NOTE — Progress Notes (Signed)
Chief Complaint:   OBESITY Marc Gates is here to discuss his progress with his obesity treatment plan along with follow-up of his obesity related diagnoses. Marc Gates is on keeping a food journal and adhering to recommended goals of 1700 calories and 50 grams of protein and states he is following his eating plan approximately 30% of the time. Marc Gates states he is walking 8-9 miles  7 times per week.  Today's visit was #: 16 Starting weight: 263 lbs Starting date: 05/27/2020 Today's weight: 225 lbs Today's date: 05/26/2021 Total lbs lost to date: 38 lbs Total lbs lost since last in-office visit: 1 lb  Interim History: Marc Gates is down an additional 1 lb since his last visit. He has been on vacation. His last visit was 04/01/2021.  Subjective:   1. Diabetes mellitus type 2 in obese (HCC) Marc Gates is taking Glucophage XR. His A1C level was 6.0 at last visit.  2. Other hyperlipidemia Marc Gates is taking Lipitor.  Assessment/Plan:   1. Diabetes mellitus type 2 in obese [facility]) Marc Gates will continue his medications and he will decrease carbohydrates. Good blood sugar control is important to decrease the likelihood of diabetic complications such as nephropathy, neuropathy, limb loss, blindness, coronary artery disease, and death. Intensive lifestyle modification including diet, exercise and weight loss are the first line of treatment for diabetes.    2. Other hyperlipidemia Cardiovascular risk and specific lipid/LDL goals reviewed.  We discussed several lifestyle modifications today and Marc Gates will continue his medication and he will continue to work on diet, exercise and weight loss efforts. Orders and follow up as documented in patient record.   Counseling Intensive lifestyle modifications are the first line treatment for this issue. Dietary changes: Increase soluble fiber. Decrease simple carbohydrates. Exercise changes: Moderate to vigorous-intensity aerobic activity 150 minutes per week if  tolerated. Lipid-lowering medications: see documented in medical record.   3. Obesity, current BMI 32.4 Marc Gates is currently in the action stage of change. As such, his goal is to continue with weight loss efforts. He has agreed to keeping a food journal and adhering to recommended goals of 1700 calories and 50 grams of protein.   Marc Gates will continue to meal plan. He will continue with intentional eating.  Exercise goals:  As is. Will continue Marc Gates.  Behavioral modification strategies: increasing lean protein intake, decreasing simple carbohydrates, increasing vegetables, increasing water intake, decreasing eating out, no skipping meals, meal planning and cooking strategies, keeping healthy foods in the home, and planning for success.  Marc Gates has agreed to follow-up with our clinic in 3-4 weeks. He was informed of the importance of frequent follow-up visits to maximize his success with intensive lifestyle modifications for his multiple health conditions.   Objective:   Blood pressure 128/82, pulse 76, temperature 97.6 F (36.4 C), height 5\' 10"  (1.778 m), weight 225 lb (102.1 kg), SpO2 95 %. Body mass index is 32.28 kg/m.  General: Cooperative, alert, well developed, in no acute distress. HEENT: Conjunctivae and lids unremarkable. Cardiovascular: Regular rhythm.  Lungs: Normal work of breathing. Neurologic: No focal deficits.   Lab Results  Component Value Date   CREATININE 0.70 04/06/2021   BUN 19 04/06/2021   NA 141 04/06/2021   K 3.8 04/06/2021   CL 106 04/06/2021   CO2 19 (L) 03/09/2021   Lab Results  Component Value Date   ALT 22 12/24/2020   AST 22 12/24/2020   ALKPHOS 90 12/24/2020   BILITOT 0.4 12/24/2020   Lab Results  Component Value  Date   HGBA1C 5.9 (H) 12/24/2020   HGBA1C 6.2 (H) 07/20/2020   HGBA1C 7.5 (H) 04/03/2019   Lab Results  Component Value Date   INSULIN 12.0 12/24/2020   INSULIN 40.2 (H) 07/20/2020   Lab Results  Component Value Date    TSH 1.260 05/27/2020   Lab Results  Component Value Date   CHOL 102 05/27/2020   HDL 47 05/27/2020   LDLCALC 30 05/27/2020   TRIG 146 05/27/2020   Lab Results  Component Value Date   VD25OH 60.3 12/24/2020   VD25OH 40.0 05/27/2020   Lab Results  Component Value Date   WBC 9.8 03/09/2021   HGB 14.3 04/06/2021   HCT 42.0 04/06/2021   MCV 91 03/09/2021   PLT 226 03/09/2021   No results found for: IRON, TIBC, FERRITIN  Obesity Behavioral Intervention:   Approximately 15 minutes were spent on the discussion below.  ASK: We discussed the diagnosis of obesity with Marc Gates today and Marc Gates agreed to give [name] permission to discuss obesity behavioral modification therapy today.  ASSESS: Marc Gates has the diagnosis of obesity and his BMI today is 32.4. Marc Gates is in the action stage of change.   ADVISE: Marc Gates was educated on the multiple health risks of obesity as well as the benefit of weight loss to improve his health. He was advised of the need for long term treatment and the importance of lifestyle modifications to improve his current health and to decrease his risk of future health problems.  AGREE: Multiple dietary modification options and treatment options were discussed and Marc Gates agreed to follow the recommendations documented in the above note.  ARRANGE: Marc Gates was educated on the importance of frequent visits to treat obesity as outlined per CMS and USPSTF guidelines and agreed to schedule his next follow up appointment today.  Attestation Statements:   Reviewed by clinician on day of visit: allergies, medications, problem list, medical history, surgical history, family history, social history, and previous encounter notes.  I, Jackson Latino, RMA, am acting as Energy manager for [employer], DO.  I have reviewed the above documentation for accuracy and completeness, and I agree with the above. [name], DO

## 2021-06-03 ENCOUNTER — Encounter (INDEPENDENT_AMBULATORY_CARE_PROVIDER_SITE_OTHER): Payer: Self-pay | Admitting: Bariatrics

## 2021-06-07 DIAGNOSIS — J301 Allergic rhinitis due to pollen: Secondary | ICD-10-CM | POA: Diagnosis not present

## 2021-06-14 DIAGNOSIS — J301 Allergic rhinitis due to pollen: Secondary | ICD-10-CM | POA: Diagnosis not present

## 2021-06-21 DIAGNOSIS — J301 Allergic rhinitis due to pollen: Secondary | ICD-10-CM | POA: Diagnosis not present

## 2021-06-23 ENCOUNTER — Encounter (INDEPENDENT_AMBULATORY_CARE_PROVIDER_SITE_OTHER): Payer: Self-pay | Admitting: Bariatrics

## 2021-06-23 ENCOUNTER — Other Ambulatory Visit: Payer: Self-pay

## 2021-06-23 ENCOUNTER — Ambulatory Visit (INDEPENDENT_AMBULATORY_CARE_PROVIDER_SITE_OTHER): Payer: Medicare PPO | Admitting: Bariatrics

## 2021-06-23 VITALS — BP 120/78 | HR 62 | Temp 97.8°F | Ht 70.0 in | Wt 230.0 lb

## 2021-06-23 DIAGNOSIS — E1169 Type 2 diabetes mellitus with other specified complication: Secondary | ICD-10-CM

## 2021-06-23 DIAGNOSIS — E7849 Other hyperlipidemia: Secondary | ICD-10-CM | POA: Diagnosis not present

## 2021-06-23 DIAGNOSIS — Z6837 Body mass index (BMI) 37.0-37.9, adult: Secondary | ICD-10-CM | POA: Diagnosis not present

## 2021-06-24 ENCOUNTER — Encounter (INDEPENDENT_AMBULATORY_CARE_PROVIDER_SITE_OTHER): Payer: Self-pay | Admitting: Bariatrics

## 2021-06-24 NOTE — Progress Notes (Signed)
Chief Complaint:   OBESITY Alton is here to discuss his progress with his obesity treatment plan along with follow-up of his obesity related diagnoses. Johnmark is on keeping a food journal and adhering to recommended goals of 1700 calories and 100 grams of protein and states he is following his eating plan approximately 30% of the time. Dorn states he is walking 10,000-11,000 steps 3-4 times per week.  Today's visit was #: 17 Starting weight: 263 lbs Starting date: 05/27/2020 Today's weight: 230 lbs Today's date: 06/23/2021 Total lbs lost to date: 33 lbs Total lbs lost since last in-office visit: 0  Interim History: Cooper is up 5 lbs before his last visit. He is getting adequate water and protein.  Subjective:   1. Type 2 diabetes mellitus with other specified complication, without long-term current use of insulin (HCC) Ebenezer is currently taking Trulicity and Metformin. His last fasting blood sugar was 110-140's. He denies lows.  2. Other hyperlipidemia Donne is currently taking Lipitor.  Assessment/Plan:   1. Type 2 diabetes mellitus with other specified complication, without long-term current use of insulin (HCC) Hashir will continue medications. Good blood sugar control is important to decrease the likelihood of diabetic complications such as nephropathy, neuropathy, limb loss, blindness, coronary artery disease, and death. Intensive lifestyle modification including diet, exercise and weight loss are the first line of treatment for diabetes.   2. Other hyperlipidemia Cardiovascular risk and specific lipid/LDL goals reviewed.  We discussed several lifestyle modifications today and Kaius will continue to work on diet, exercise and weight loss efforts. Algernon Huxley will continue medications. Orders and follow up as documented in patient record.   Counseling Intensive lifestyle modifications are the first line treatment for this issue. Dietary changes: Increase soluble fiber. Decrease  simple carbohydrates. Exercise changes: Moderate to vigorous-intensity aerobic activity 150 minutes per week if tolerated. Lipid-lowering medications: see documented in medical record.   3. Obesity, current BMI 33.1 Jotham is currently in the action stage of change. As such, his goal is to continue with weight loss efforts. He has agreed to keeping a food journal and adhering to recommended goals of 1700 calories and 100 grams of protein.   Coda will adhere to the plan closely 80-85%. He will continue meal planning. He will decrease carbohydrates.  Exercise goals: Yaasir joined the gym ( cardiac, balance ).   Behavioral modification strategies: increasing lean protein intake, decreasing simple carbohydrates, increasing vegetables, increasing water intake, decreasing eating out, no skipping meals, meal planning and cooking strategies, keeping healthy foods in the home, and planning for success.  Demitris has agreed to follow-up with our clinic in 2-3 weeks. He was informed of the importance of frequent follow-up visits to maximize his success with intensive lifestyle modifications for his multiple health conditions.   Objective:   Blood pressure 120/78, pulse 62, temperature 97.8 F (36.6 C), height 5\' 10"  (1.778 m), weight 230 lb (104.3 kg), SpO2 95 %. Body mass index is 33 kg/m.  General: Cooperative, alert, well developed, in no acute distress. HEENT: Conjunctivae and lids unremarkable. Cardiovascular: Regular rhythm.  Lungs: Normal work of breathing. Neurologic: No focal deficits.   Lab Results  Component Value Date   CREATININE 0.70 04/06/2021   BUN 19 04/06/2021   NA 141 04/06/2021   K 3.8 04/06/2021   CL 106 04/06/2021   CO2 19 (L) 03/09/2021   Lab Results  Component Value Date   ALT 22 12/24/2020   AST 22 12/24/2020   ALKPHOS 90  12/24/2020   BILITOT 0.4 12/24/2020   Lab Results  Component Value Date   HGBA1C 5.9 (H) 12/24/2020   HGBA1C 6.2 (H) 07/20/2020   HGBA1C  7.5 (H) 04/03/2019   Lab Results  Component Value Date   INSULIN 12.0 12/24/2020   INSULIN 40.2 (H) 07/20/2020   Lab Results  Component Value Date   TSH 1.260 05/27/2020   Lab Results  Component Value Date   CHOL 102 05/27/2020   HDL 47 05/27/2020   LDLCALC 30 05/27/2020   TRIG 146 05/27/2020   Lab Results  Component Value Date   VD25OH 60.3 12/24/2020   VD25OH 40.0 05/27/2020   Lab Results  Component Value Date   WBC 9.8 03/09/2021   HGB 14.3 04/06/2021   HCT 42.0 04/06/2021   MCV 91 03/09/2021   PLT 226 03/09/2021   No results found for: IRON, TIBC, FERRITIN  Obesity Behavioral Intervention:   Approximately 15 minutes were spent on the discussion below.  ASK: We discussed the diagnosis of obesity with [name] today and Christain agreed to give [name] permission to discuss obesity behavioral modification therapy today.  ASSESS: Jebidiah has the diagnosis of obesity and his BMI today is 33.1. Aiden is in the action stage of change.   ADVISE: Dariush was educated on the multiple health risks of obesity as well as the benefit of weight loss to improve his health. He was advised of the need for long term treatment and the importance of lifestyle modifications to improve his current health and to decrease his risk of future health problems.  AGREE: Multiple dietary modification options and treatment options were discussed and Cordarrel agreed to follow the recommendations documented in the above note.  ARRANGE: Claus was educated on the importance of frequent visits to treat obesity as outlined per CMS and USPSTF guidelines and agreed to schedule his next follow up appointment today.  Attestation Statements:   Reviewed by clinician on day of visit: allergies, medications, problem list, medical history, surgical history, family history, social history, and previous encounter notes.  I, Jackson Latino, RMA, am acting as Energy manager for [employer], DO.   I have reviewed the above  documentation for accuracy and completeness, and I agree with the above. [name], DO

## 2021-06-28 DIAGNOSIS — J301 Allergic rhinitis due to pollen: Secondary | ICD-10-CM | POA: Diagnosis not present

## 2021-07-05 DIAGNOSIS — J301 Allergic rhinitis due to pollen: Secondary | ICD-10-CM | POA: Diagnosis not present

## 2021-07-13 DIAGNOSIS — J301 Allergic rhinitis due to pollen: Secondary | ICD-10-CM | POA: Diagnosis not present

## 2021-07-15 ENCOUNTER — Ambulatory Visit (INDEPENDENT_AMBULATORY_CARE_PROVIDER_SITE_OTHER): Payer: Medicare PPO | Admitting: Bariatrics

## 2021-07-15 ENCOUNTER — Encounter (INDEPENDENT_AMBULATORY_CARE_PROVIDER_SITE_OTHER): Payer: Self-pay | Admitting: Bariatrics

## 2021-07-15 ENCOUNTER — Other Ambulatory Visit: Payer: Self-pay

## 2021-07-15 VITALS — BP 100/60 | HR 64 | Temp 97.4°F | Ht 70.0 in | Wt 232.0 lb

## 2021-07-15 DIAGNOSIS — E559 Vitamin D deficiency, unspecified: Secondary | ICD-10-CM

## 2021-07-15 DIAGNOSIS — E1159 Type 2 diabetes mellitus with other circulatory complications: Secondary | ICD-10-CM | POA: Diagnosis not present

## 2021-07-15 DIAGNOSIS — E6609 Other obesity due to excess calories: Secondary | ICD-10-CM

## 2021-07-15 DIAGNOSIS — I152 Hypertension secondary to endocrine disorders: Secondary | ICD-10-CM | POA: Diagnosis not present

## 2021-07-15 DIAGNOSIS — L602 Onychogryphosis: Secondary | ICD-10-CM | POA: Diagnosis not present

## 2021-07-15 DIAGNOSIS — Z6833 Body mass index (BMI) 33.0-33.9, adult: Secondary | ICD-10-CM

## 2021-07-15 DIAGNOSIS — L84 Corns and callosities: Secondary | ICD-10-CM | POA: Diagnosis not present

## 2021-07-15 DIAGNOSIS — Z6837 Body mass index (BMI) 37.0-37.9, adult: Secondary | ICD-10-CM

## 2021-07-15 DIAGNOSIS — E1351 Other specified diabetes mellitus with diabetic peripheral angiopathy without gangrene: Secondary | ICD-10-CM | POA: Diagnosis not present

## 2021-07-15 MED ORDER — VITAMIN D (ERGOCALCIFEROL) 1.25 MG (50000 UNIT) PO CAPS: 50000.0000 [IU] | ORAL_CAPSULE | ORAL | 0 refills | Status: DC

## 2021-07-15 NOTE — Progress Notes (Signed)
Chief Complaint:   OBESITY Marc Gates is here to discuss his progress with his obesity treatment plan along with follow-up of his obesity related diagnoses. Marc Gates is on keeping a food journal and adhering to recommended goals of 1700 calories and 100 grams of protein and states he is following his eating plan approximately 80% of the time. Marc Gates states he is walking stair steps for 0 minutes 5 times per week.  Today's visit was #: 18 Starting weight: 263 lb Starting date: 05/27/2020 Today's weight: 232 lbs Today's date: 07/15/2021 Total lbs lost to date: 31 lbs Total lbs lost since last in-office visit: 0  Interim History: Marc Gates is up 2 lbs since his last visit. He has been journaling.  Subjective:   1. Vitamin D deficiency Marc Gates is currently taking Vitamin D.  2. Hypertension associated with type 2 diabetes mellitus (HCC) Marc Gates's hypertension is controlled.   Assessment/Plan:   1. Vitamin D deficiency Low Vitamin D level contributes to fatigue and are associated with obesity, breast, and colon cancer. We will refill prescription Vitamin D 50,000 IU every 14 days for 1 month with no refills and Marc Gates will follow-up for routine testing of Vitamin D, at least 2-3 times per year to avoid over-replacement.  - Vitamin D, Ergocalciferol, (DRISDOL) 1.25 MG (50000 UNIT) CAPS capsule; Take 1 capsule (50,000 Units total) by mouth every 14 (fourteen) days.  Dispense: 2 capsule; Refill: 0  2. Hypertension associated with type 2 diabetes mellitus (HCC) Marc Gates will continue his medications. He is working on healthy weight loss and exercise to improve blood pressure control. We will watch for signs of hypotension as he continues his lifestyle modifications.   3. Obesity, current BMI 33.4 Marc Gates is currently in the action stage of change. As such, his goal is to continue with weight loss efforts. He has agreed to keeping a food journal and adhering to recommended goals of 1700 calories and 100 grams  of protein.   Marc Gates will continue meal planning and intentional eating.  Exercise goals:  Marc Gates will walk more.  Behavioral modification strategies: increasing lean protein intake, decreasing simple carbohydrates, increasing vegetables, increasing water intake, decreasing eating out, no skipping meals, meal planning and cooking strategies, keeping healthy foods in the home, and planning for success.  Marc Gates has agreed to follow-up with our clinic in 4 weeks. He was informed of the importance of frequent follow-up visits to maximize his success with intensive lifestyle modifications for his multiple health conditions.   Objective:   Blood pressure 100/60, pulse 64, temperature (!) 97.4 F (36.3 C), height 5\' 10"  (1.778 m), weight 232 lb (105.2 kg), SpO2 96 %. Body mass index is 33.29 kg/m.  General: Cooperative, alert, well developed, in no acute distress. HEENT: Conjunctivae and lids unremarkable. Cardiovascular: Regular rhythm.  Lungs: Normal work of breathing. Neurologic: No focal deficits.   Lab Results  Component Value Date   CREATININE 0.70 04/06/2021   BUN 19 04/06/2021   NA 141 04/06/2021   K 3.8 04/06/2021   CL 106 04/06/2021   CO2 19 (L) 03/09/2021   Lab Results  Component Value Date   ALT 22 12/24/2020   AST 22 12/24/2020   ALKPHOS 90 12/24/2020   BILITOT 0.4 12/24/2020   Lab Results  Component Value Date   HGBA1C 5.9 (H) 12/24/2020   HGBA1C 6.2 (H) 07/20/2020   HGBA1C 7.5 (H) 04/03/2019   Lab Results  Component Value Date   INSULIN 12.0 12/24/2020   INSULIN 40.2 (H)  07/20/2020   Lab Results  Component Value Date   TSH 1.260 05/27/2020   Lab Results  Component Value Date   CHOL 102 05/27/2020   HDL 47 05/27/2020   LDLCALC 30 05/27/2020   TRIG 146 05/27/2020   Lab Results  Component Value Date   VD25OH 60.3 12/24/2020   VD25OH 40.0 05/27/2020   Lab Results  Component Value Date   WBC 9.8 03/09/2021   HGB 14.3 04/06/2021   HCT 42.0  04/06/2021   MCV 91 03/09/2021   PLT 226 03/09/2021   No results found for: IRON, TIBC, FERRITIN  Obesity Behavioral Intervention:   Approximately 15 minutes were spent on the discussion below.  ASK: We discussed the diagnosis of obesity with Marc Gates today and Marc Gates agreed to give [name] permission to discuss obesity behavioral modification therapy today.  ASSESS: Marc Gates has the diagnosis of obesity and his BMI today is 33.4. Marc Gates is in the action stage of change.   ADVISE: Marc Gates was educated on the multiple health risks of obesity as well as the benefit of weight loss to improve his health. He was advised of the need for long term treatment and the importance of lifestyle modifications to improve his current health and to decrease his risk of future health problems.  AGREE: Multiple dietary modification options and treatment options were discussed and Marc Gates agreed to follow the recommendations documented in the above note.  ARRANGE: Marc Gates was educated on the importance of frequent visits to treat obesity as outlined per CMS and USPSTF guidelines and agreed to schedule his next follow up appointment today.  Attestation Statements:   Reviewed by clinician on day of visit: allergies, medications, problem list, medical history, surgical history, family history, social history, and previous encounter notes.  I, Jackson Latino, RMA, am acting as Energy manager for [employer], DO.   I have reviewed the above documentation for accuracy and completeness, and I agree with the above. [name], DO

## 2021-07-18 ENCOUNTER — Encounter (INDEPENDENT_AMBULATORY_CARE_PROVIDER_SITE_OTHER): Payer: Self-pay | Admitting: Bariatrics

## 2021-07-19 DIAGNOSIS — J301 Allergic rhinitis due to pollen: Secondary | ICD-10-CM | POA: Diagnosis not present

## 2021-08-06 DIAGNOSIS — J301 Allergic rhinitis due to pollen: Secondary | ICD-10-CM | POA: Diagnosis not present

## 2021-08-09 DIAGNOSIS — I2510 Atherosclerotic heart disease of native coronary artery without angina pectoris: Secondary | ICD-10-CM | POA: Diagnosis not present

## 2021-08-09 DIAGNOSIS — K219 Gastro-esophageal reflux disease without esophagitis: Secondary | ICD-10-CM | POA: Diagnosis not present

## 2021-08-09 DIAGNOSIS — E781 Pure hyperglyceridemia: Secondary | ICD-10-CM | POA: Diagnosis not present

## 2021-08-09 DIAGNOSIS — J301 Allergic rhinitis due to pollen: Secondary | ICD-10-CM | POA: Diagnosis not present

## 2021-08-09 DIAGNOSIS — I10 Essential (primary) hypertension: Secondary | ICD-10-CM | POA: Diagnosis not present

## 2021-08-09 DIAGNOSIS — E1169 Type 2 diabetes mellitus with other specified complication: Secondary | ICD-10-CM | POA: Diagnosis not present

## 2021-08-09 DIAGNOSIS — E785 Hyperlipidemia, unspecified: Secondary | ICD-10-CM | POA: Diagnosis not present

## 2021-08-09 DIAGNOSIS — N400 Enlarged prostate without lower urinary tract symptoms: Secondary | ICD-10-CM | POA: Diagnosis not present

## 2021-08-12 ENCOUNTER — Other Ambulatory Visit: Payer: Self-pay

## 2021-08-12 ENCOUNTER — Ambulatory Visit (INDEPENDENT_AMBULATORY_CARE_PROVIDER_SITE_OTHER): Payer: Medicare PPO | Admitting: Bariatrics

## 2021-08-12 ENCOUNTER — Encounter (INDEPENDENT_AMBULATORY_CARE_PROVIDER_SITE_OTHER): Payer: Self-pay | Admitting: Bariatrics

## 2021-08-12 VITALS — BP 135/82 | HR 65 | Temp 98.0°F | Ht 70.0 in | Wt 230.0 lb

## 2021-08-12 DIAGNOSIS — Z6837 Body mass index (BMI) 37.0-37.9, adult: Secondary | ICD-10-CM | POA: Diagnosis not present

## 2021-08-12 DIAGNOSIS — E559 Vitamin D deficiency, unspecified: Secondary | ICD-10-CM | POA: Diagnosis not present

## 2021-08-12 DIAGNOSIS — E1159 Type 2 diabetes mellitus with other circulatory complications: Secondary | ICD-10-CM

## 2021-08-12 DIAGNOSIS — I152 Hypertension secondary to endocrine disorders: Secondary | ICD-10-CM | POA: Diagnosis not present

## 2021-08-12 MED ORDER — VITAMIN D (ERGOCALCIFEROL) 1.25 MG (50000 UNIT) PO CAPS: 50000.0000 [IU] | ORAL_CAPSULE | ORAL | 0 refills | Status: DC

## 2021-08-12 NOTE — Progress Notes (Signed)
Chief Complaint:   OBESITY Marc Gates is here to discuss his progress with his obesity treatment plan along with follow-up of his obesity related diagnoses. Marc Gates is on keeping a food journal and adhering to recommended goals of 1700 calories and 80 grams of protein and states he is following his eating plan approximately 80-90% of the time. Marc Gates states he is counting steps 16,000 6 times per week.  Today's visit was #: 19 Starting weight: 263 lbs Starting date: 05/27/2020 Today's weight: 230 lbs Today's date: 08/12/2021 Total lbs lost to date: 33 lbs Total lbs lost since last in-office visit: 2 lbs  Interim History: Marc Gates is down 2 lbs since his last visit. [place] but has gotten back on the plan since being back in the country.  Subjective:   1. Vitamin D deficiency He is currently taking prescription vitamin D 50,000 IU each week. He denies nausea, vomiting or muscle weakness.  2. Hypertension associated with diabetes (HCC) Marc Gates's blood pressure is reasonably controlled.   Assessment/Plan:   1. Vitamin D deficiency Low Vitamin D level contributes to fatigue and are associated with obesity, breast, and colon cancer. We will refill prescription Vitamin D 50,000 IU every 14 days and Marc Gates will follow-up for routine testing of Vitamin D, at least 2-3 times per year to avoid over-replacement.  - Vitamin D, Ergocalciferol, (DRISDOL) 1.25 MG (50000 UNIT) CAPS capsule; Take 1 capsule (50,000 Units total) by mouth every 14 (fourteen) days.  Dispense: 2 capsule; Refill: 0  2. Hypertension associated with diabetes (HCC) Marc Gates will continue his medications. He is working on healthy weight loss and exercise to improve blood pressure control. We will watch for signs of hypotension as he continues his lifestyle modifications.  3. Obesity, current BMI 33.1 Marc Gates is currently in the action stage of change. As such, his goal is to continue with weight loss efforts. He has  agreed to keeping a food journal and adhering to recommended goals of 1700 calories and 80 grams of protein.   Marc Gates will continue meal planning and intentional eating. He will increase protein.   Exercise goals:  As is.  Behavioral modification strategies: increasing lean protein intake, decreasing simple carbohydrates, increasing vegetables, increasing water intake, decreasing eating out, no skipping meals, meal planning and cooking strategies, keeping healthy foods in the home, and planning for success.  Marc Gates has agreed to follow-up with our clinic in 4 weeks (fasting). He was informed of the importance of frequent follow-up visits to maximize his success with intensive lifestyle modifications for his multiple health conditions.   Objective:   Blood pressure 135/82, pulse 65, temperature 98 F (36.7 C), height 5\' 10"  (1.778 m), weight 230 lb (104.3 kg), SpO2 96 %. Body mass index is 33 kg/m.  General: Cooperative, alert, well developed, in no acute distress. HEENT: Conjunctivae and lids unremarkable. Cardiovascular: Regular rhythm.  Lungs: Normal work of breathing. Neurologic: No focal deficits.   Lab Results  Component Value Date   CREATININE 0.70 04/06/2021   BUN 19 04/06/2021   NA 141 04/06/2021   K 3.8 04/06/2021   CL 106 04/06/2021   CO2 19 (L) 03/09/2021   Lab Results  Component Value Date   ALT 22 12/24/2020   AST 22 12/24/2020   ALKPHOS 90 12/24/2020   BILITOT 0.4 12/24/2020   Lab Results  Component Value Date   HGBA1C 5.9 (H) 12/24/2020   HGBA1C 6.2 (H) 07/20/2020   HGBA1C 7.5 (H) 04/03/2019  Lab Results  Component Value Date   INSULIN 12.0 12/24/2020   INSULIN 40.2 (H) 07/20/2020   Lab Results  Component Value Date   TSH 1.260 05/27/2020   Lab Results  Component Value Date   CHOL 102 05/27/2020   HDL 47 05/27/2020   LDLCALC 30 05/27/2020   TRIG 146 05/27/2020   Lab Results  Component Value Date   VD25OH 60.3 12/24/2020   VD25OH 40.0  05/27/2020   Lab Results  Component Value Date   WBC 9.8 03/09/2021   HGB 14.3 04/06/2021   HCT 42.0 04/06/2021   MCV 91 03/09/2021   PLT 226 03/09/2021   No results found for: IRON, TIBC, FERRITIN  Obesity Behavioral Intervention:   Approximately 15 minutes were spent on the discussion below.  ASK: We discussed the diagnosis of obesity with Marc Gates today and Marc Gates agreed to give [name] permission to discuss obesity behavioral modification therapy today.  ASSESS: Marc Gates has the diagnosis of obesity and his BMI today is 33.1. Marc Gates is in the action stage of change.   ADVISE: Marc Gates was educated on the multiple health risks of obesity as well as the benefit of weight loss to improve his health. He was advised of the need for long term treatment and the importance of lifestyle modifications to improve his current health and to decrease his risk of future health problems.  AGREE: Multiple dietary modification options and treatment options were discussed and Marc Gates agreed to follow the recommendations documented in the above note.  ARRANGE: Marc Gates was educated on the importance of frequent visits to treat obesity as outlined per CMS and USPSTF guidelines and agreed to schedule his next follow up appointment today.  Attestation Statements:   Reviewed by clinician on day of visit: allergies, medications, problem list, medical history, surgical history, family history, social history, and previous encounter notes.  I, Marc Gates, RMA, am acting as Energy manager for [employer], DO.   I have reviewed the above documentation for accuracy and completeness, and I agree with the above. Marc Capra, DO

## 2021-08-16 ENCOUNTER — Encounter (INDEPENDENT_AMBULATORY_CARE_PROVIDER_SITE_OTHER): Payer: Self-pay | Admitting: Bariatrics

## 2021-08-16 DIAGNOSIS — J301 Allergic rhinitis due to pollen: Secondary | ICD-10-CM | POA: Diagnosis not present

## 2021-08-16 DIAGNOSIS — J3089 Other allergic rhinitis: Secondary | ICD-10-CM | POA: Diagnosis not present

## 2021-08-23 DIAGNOSIS — J3089 Other allergic rhinitis: Secondary | ICD-10-CM | POA: Diagnosis not present

## 2021-08-23 DIAGNOSIS — J301 Allergic rhinitis due to pollen: Secondary | ICD-10-CM | POA: Diagnosis not present

## 2021-08-30 DIAGNOSIS — M1712 Unilateral primary osteoarthritis, left knee: Secondary | ICD-10-CM | POA: Diagnosis not present

## 2021-08-30 DIAGNOSIS — M25462 Effusion, left knee: Secondary | ICD-10-CM | POA: Diagnosis not present

## 2021-08-30 DIAGNOSIS — J301 Allergic rhinitis due to pollen: Secondary | ICD-10-CM | POA: Diagnosis not present

## 2021-08-30 DIAGNOSIS — G4733 Obstructive sleep apnea (adult) (pediatric): Secondary | ICD-10-CM | POA: Diagnosis not present

## 2021-09-06 DIAGNOSIS — J301 Allergic rhinitis due to pollen: Secondary | ICD-10-CM | POA: Diagnosis not present

## 2021-09-09 ENCOUNTER — Other Ambulatory Visit: Payer: Self-pay

## 2021-09-09 ENCOUNTER — Ambulatory Visit (INDEPENDENT_AMBULATORY_CARE_PROVIDER_SITE_OTHER): Payer: Medicare PPO | Admitting: Bariatrics

## 2021-09-09 ENCOUNTER — Encounter (INDEPENDENT_AMBULATORY_CARE_PROVIDER_SITE_OTHER): Payer: Self-pay | Admitting: Bariatrics

## 2021-09-09 VITALS — BP 131/77 | HR 60 | Temp 97.5°F | Ht 70.0 in | Wt 229.0 lb

## 2021-09-09 DIAGNOSIS — E1169 Type 2 diabetes mellitus with other specified complication: Secondary | ICD-10-CM

## 2021-09-09 DIAGNOSIS — Z6837 Body mass index (BMI) 37.0-37.9, adult: Secondary | ICD-10-CM

## 2021-09-09 DIAGNOSIS — E7849 Other hyperlipidemia: Secondary | ICD-10-CM

## 2021-09-09 DIAGNOSIS — E559 Vitamin D deficiency, unspecified: Secondary | ICD-10-CM | POA: Diagnosis not present

## 2021-09-09 MED ORDER — VITAMIN D (ERGOCALCIFEROL) 1.25 MG (50000 UNIT) PO CAPS: 50000.0000 [IU] | ORAL_CAPSULE | ORAL | 0 refills | Status: DC

## 2021-09-09 NOTE — Progress Notes (Signed)
Chief Complaint:   OBESITY Marc Gates is here to discuss his progress with his obesity treatment plan along with follow-up of his obesity related diagnoses. Marc Gates is on keeping a food journal and adhering to recommended goals of 1700 calories and 80 grams of protein and states he is following his eating plan approximately 40% of the time. Marc Gates states he is counting his steps 8,000-10,000 steps 5-6 times per week.  Today's visit was #: 20 Starting weight: 263 lbs Starting date: 05/27/2020 Today's weight: 229 lbs Today's date: 09/09/2021 Total lbs lost to date: 34 lbs Total lbs lost since last in-office visit: 1 lb  Interim History: Marc Gates is down another 1 lb and doing well overall. He had a cortisone injection in his left knee.   Subjective:   1. Vitamin D deficiency Marc Gates is currently taking Vitamin D.   2. Other hyperlipidemia Marc Gates is currently taking Lipitor as directed.   3. Type 2 diabetes mellitus with other specified complication, without long-term current use of insulin (HCC) Marc Gates is taking Metformin currently.   Assessment/Plan:   1. Vitamin D deficiency Low Vitamin D level contributes to fatigue and are associated with obesity, breast, and colon cancer. We will refill Vitamin D. Marc Gates agrees to continue to take prescription Vitamin D 50,000 IU every 14 days and he will follow-up for routine testing of Vitamin D, at least 2-3 times per year to avoid over-replacement. We will check Vitamin D today.   - Vitamin D, Ergocalciferol, (DRISDOL) 1.25 MG (50000 UNIT) CAPS capsule; Take 1 capsule (50,000 Units total) by mouth every 14 (fourteen) days.  Dispense: 4 capsule; Refill: 0 - VITAMIN D 25 Hydroxy (Vit-D Deficiency, Fractures)  2. Other hyperlipidemia Cardiovascular risk and specific lipid/LDL goals reviewed.  We discussed several lifestyle modifications today and Marc Gates will continue to work on diet, exercise and weight loss efforts. Marc Gates will continue Lipitor. We will  check Lipids today. Orders and follow up as documented in patient record.   Counseling Intensive lifestyle modifications are the first line treatment for this issue. Dietary changes: Increase soluble fiber. Decrease simple carbohydrates. Exercise changes: Moderate to vigorous-intensity aerobic activity 150 minutes per week if tolerated. Lipid-lowering medications: see documented in medical record.  - Lipid Panel With LDL/HDL Ratio  3. Type 2 diabetes mellitus with other specified complication, without long-term current use of insulin (HCC) We will check Insulin, A1C and CMP today. Good blood sugar control is important to decrease the likelihood of diabetic complications such as nephropathy, neuropathy, limb loss, blindness, coronary artery disease, and death. Intensive lifestyle modification including diet, exercise and weight loss are the first line of treatment for diabetes.   - Comprehensive metabolic panel - Hemoglobin A1c - Insulin, random  4. Obesity, current BMI 32.9 Marc Gates is currently in the action stage of change. As such, his goal is to continue with weight loss efforts. He has agreed to keeping a food journal and adhering to recommended goals of 1700 calories and 80 grams of protein.   Marc Gates will continue meal planning. He will adhere closely to the plan. He will keep his protein high.  Exercise goals:  As is. Marc Gates will join a gym.  Behavioral modification strategies: increasing lean protein intake, decreasing simple carbohydrates, increasing vegetables, increasing water intake, decreasing eating out, no skipping meals, meal planning and cooking strategies, keeping healthy foods in the home, and planning for success.  Marc Gates has agreed to follow-up with our clinic in 3 weeks. He was informed of the  importance of frequent follow-up visits to maximize his success with intensive lifestyle modifications for his multiple health conditions.   Marc Gates was informed we would discuss his  lab results at his next visit unless there is a critical issue that needs to be addressed sooner. Marc Gates agreed to keep his next visit at the agreed upon time to discuss these results.  Objective:   Blood pressure 131/77, pulse 60, temperature (!) 97.5 F (36.4 C), height 5\' 10"  (1.778 m), weight 229 lb (103.9 kg), SpO2 94 %. Body mass index is 32.86 kg/m.  General: Cooperative, alert, well developed, in no acute distress. HEENT: Conjunctivae and lids unremarkable. Cardiovascular: Regular rhythm.  Lungs: Normal work of breathing. Neurologic: No focal deficits.   Lab Results  Component Value Date   CREATININE 0.70 04/06/2021   BUN 19 04/06/2021   NA 141 04/06/2021   K 3.8 04/06/2021   CL 106 04/06/2021   CO2 19 (L) 03/09/2021   Lab Results  Component Value Date   ALT 22 12/24/2020   AST 22 12/24/2020   ALKPHOS 90 12/24/2020   BILITOT 0.4 12/24/2020   Lab Results  Component Value Date   HGBA1C 5.9 (H) 12/24/2020   HGBA1C 6.2 (H) 07/20/2020   HGBA1C 7.5 (H) 04/03/2019   Lab Results  Component Value Date   INSULIN 12.0 12/24/2020   INSULIN 40.2 (H) 07/20/2020   Lab Results  Component Value Date   TSH 1.260 05/27/2020   Lab Results  Component Value Date   CHOL 102 05/27/2020   HDL 47 05/27/2020   LDLCALC 30 05/27/2020   TRIG 146 05/27/2020   Lab Results  Component Value Date   VD25OH 60.3 12/24/2020   VD25OH 40.0 05/27/2020   Lab Results  Component Value Date   WBC 9.8 03/09/2021   HGB 14.3 04/06/2021   HCT 42.0 04/06/2021   MCV 91 03/09/2021   PLT 226 03/09/2021   No results found for: IRON, TIBC, FERRITIN  Attestation Statements:   Reviewed by clinician on day of visit: allergies, medications, problem list, medical history, surgical history, family history, social history, and previous encounter notes.   I, 05/09/2021, RMA, am acting as Jackson Latino for Energy manager, DO.   I have reviewed the above documentation for accuracy and  completeness, and I agree with the above. [employer], DO

## 2021-09-10 LAB — COMPREHENSIVE METABOLIC PANEL
ALT: 15 IU/L (ref 0–44)
AST: 15 IU/L (ref 0–40)
Albumin/Globulin Ratio: 2.5 — ABNORMAL HIGH (ref 1.2–2.2)
Albumin: 4.8 g/dL (ref 3.8–4.8)
Alkaline Phosphatase: 91 IU/L (ref 44–121)
BUN/Creatinine Ratio: 16 (ref 10–24)
BUN: 14 mg/dL (ref 8–27)
Bilirubin Total: 0.5 mg/dL (ref 0.0–1.2)
CO2: 19 mmol/L — ABNORMAL LOW (ref 20–29)
Calcium: 9.4 mg/dL (ref 8.6–10.2)
Chloride: 107 mmol/L — ABNORMAL HIGH (ref 96–106)
Creatinine, Ser: 0.87 mg/dL (ref 0.76–1.27)
Globulin, Total: 1.9 g/dL (ref 1.5–4.5)
Glucose: 104 mg/dL — ABNORMAL HIGH (ref 70–99)
Potassium: 4.3 mmol/L (ref 3.5–5.2)
Sodium: 141 mmol/L (ref 134–144)
Total Protein: 6.7 g/dL (ref 6.0–8.5)
eGFR: 93 mL/min/{1.73_m2} (ref >59)

## 2021-09-10 LAB — INSULIN, RANDOM: INSULIN: 16.2 u[IU]/mL (ref 2.6–24.9)

## 2021-09-10 LAB — HEMOGLOBIN A1C
Est. average glucose Bld gHb Est-mCnc: 123 mg/dL
Hgb A1c MFr Bld: 5.9 % — ABNORMAL HIGH (ref 4.8–5.6)

## 2021-09-10 LAB — LIPID PANEL WITH LDL/HDL RATIO
Cholesterol, Total: 133 mg/dL (ref 100–199)
HDL: 45 mg/dL (ref >39)
LDL Chol Calc (NIH): 58 mg/dL (ref 0–99)
LDL/HDL Ratio: 1.3 ratio (ref 0.0–3.6)
Triglycerides: 178 mg/dL — ABNORMAL HIGH (ref 0–149)
VLDL Cholesterol Cal: 30 mg/dL (ref 5–40)

## 2021-09-10 LAB — VITAMIN D 25 HYDROXY (VIT D DEFICIENCY, FRACTURES): Vit D, 25-Hydroxy: 47.1 ng/mL (ref 30.0–100.0)

## 2021-09-13 ENCOUNTER — Encounter (INDEPENDENT_AMBULATORY_CARE_PROVIDER_SITE_OTHER): Payer: Self-pay | Admitting: Bariatrics

## 2021-09-13 DIAGNOSIS — J301 Allergic rhinitis due to pollen: Secondary | ICD-10-CM | POA: Diagnosis not present

## 2021-09-20 DIAGNOSIS — J301 Allergic rhinitis due to pollen: Secondary | ICD-10-CM | POA: Diagnosis not present

## 2021-09-27 DIAGNOSIS — J301 Allergic rhinitis due to pollen: Secondary | ICD-10-CM | POA: Diagnosis not present

## 2021-09-27 DIAGNOSIS — L603 Nail dystrophy: Secondary | ICD-10-CM | POA: Diagnosis not present

## 2021-09-27 DIAGNOSIS — E1151 Type 2 diabetes mellitus with diabetic peripheral angiopathy without gangrene: Secondary | ICD-10-CM | POA: Diagnosis not present

## 2021-09-27 DIAGNOSIS — B351 Tinea unguium: Secondary | ICD-10-CM | POA: Diagnosis not present

## 2021-09-27 DIAGNOSIS — E1142 Type 2 diabetes mellitus with diabetic polyneuropathy: Secondary | ICD-10-CM | POA: Diagnosis not present

## 2021-09-27 DIAGNOSIS — J3089 Other allergic rhinitis: Secondary | ICD-10-CM | POA: Diagnosis not present

## 2021-09-27 DIAGNOSIS — L84 Corns and callosities: Secondary | ICD-10-CM | POA: Diagnosis not present

## 2021-09-28 ENCOUNTER — Ambulatory Visit (INDEPENDENT_AMBULATORY_CARE_PROVIDER_SITE_OTHER): Payer: Medicare PPO | Admitting: Bariatrics

## 2021-10-04 DIAGNOSIS — J301 Allergic rhinitis due to pollen: Secondary | ICD-10-CM | POA: Diagnosis not present

## 2021-10-14 DIAGNOSIS — J301 Allergic rhinitis due to pollen: Secondary | ICD-10-CM | POA: Diagnosis not present

## 2021-10-18 DIAGNOSIS — J301 Allergic rhinitis due to pollen: Secondary | ICD-10-CM | POA: Diagnosis not present

## 2021-10-21 ENCOUNTER — Encounter (INDEPENDENT_AMBULATORY_CARE_PROVIDER_SITE_OTHER): Payer: Self-pay

## 2021-10-25 ENCOUNTER — Encounter (INDEPENDENT_AMBULATORY_CARE_PROVIDER_SITE_OTHER): Payer: Self-pay | Admitting: Bariatrics

## 2021-10-25 ENCOUNTER — Other Ambulatory Visit: Payer: Self-pay

## 2021-10-25 ENCOUNTER — Ambulatory Visit (INDEPENDENT_AMBULATORY_CARE_PROVIDER_SITE_OTHER): Payer: Medicare PPO | Admitting: Bariatrics

## 2021-10-25 VITALS — BP 115/69 | HR 104 | Temp 98.2°F | Ht 70.0 in | Wt 237.0 lb

## 2021-10-25 DIAGNOSIS — Z6837 Body mass index (BMI) 37.0-37.9, adult: Secondary | ICD-10-CM

## 2021-10-25 DIAGNOSIS — E559 Vitamin D deficiency, unspecified: Secondary | ICD-10-CM | POA: Diagnosis not present

## 2021-10-25 DIAGNOSIS — J301 Allergic rhinitis due to pollen: Secondary | ICD-10-CM | POA: Diagnosis not present

## 2021-10-25 DIAGNOSIS — E1169 Type 2 diabetes mellitus with other specified complication: Secondary | ICD-10-CM | POA: Diagnosis not present

## 2021-10-25 MED ORDER — VITAMIN D (ERGOCALCIFEROL) 1.25 MG (50000 UNIT) PO CAPS: 50000.0000 [IU] | ORAL_CAPSULE | ORAL | 0 refills | Status: DC

## 2021-10-26 NOTE — Progress Notes (Signed)
Chief Complaint:   OBESITY Marc Gates is here to discuss his progress with his obesity treatment plan along with follow-up of his obesity related diagnoses. Marc Gates is on keeping a food journal and adhering to recommended goals of 1700 calories and 80 grams of protein and states he is following his eating plan approximately 50% of the time. Marc Gates states he is doing gym exercise for 60 minutes 3 times per week.  Today's visit was #: 21 Starting weight: 263 lbs Starting date: 05/27/2020 Today's weight: 237 lbs Today's date: 21/19/2022 Total lbs lost to date: 26 lbs Total lbs lost since last in-office visit: 0  Interim History: Marc Gates is up 8 lbs since his last visit. He went to [place] but he notes that he ate more than he walked and was unable to get his Trulicity. He is now on Ozempic.   Subjective:   1. Vitamin D deficiency Burke is taking Vitamin D as directed.  2. Type 2 diabetes mellitus with other specified complication, without long-term current use of insulin (HCC) Tyriek is currently taking Trulicity and Glucophage.   Assessment/Plan:   1. Vitamin D deficiency Low Vitamin D level contributes to fatigue and are associated with obesity, breast, and colon cancer. We will refill prescription Vitamin D 50,000 IU for every 14 days with no refills and he will follow-up for routine testing of Vitamin D, at least 2-3 times per year to avoid over-replacement.  - Vitamin D, Ergocalciferol, (DRISDOL) 1.25 MG (50000 UNIT) CAPS capsule; Take 1 capsule (50,000 Units total) by mouth every 14 (fourteen) days.  Dispense: 4 capsule; Refill: 0  2. Type 2 diabetes mellitus with other specified complication, without long-term current use of insulin (HCC) Marc Gates will continue taking his medications. Good blood sugar control is important to decrease the likelihood of diabetic complications such as nephropathy, neuropathy, limb loss, blindness, coronary artery disease, and death. Intensive lifestyle  modification including diet, exercise and weight loss are the first line of treatment for diabetes.   3. Obesity, current BMI 34.1 Marc Gates is currently in the action stage of change. As such, his goal is to continue with weight loss efforts. He has agreed to keeping a food journal and adhering to recommended goals of 1700 calories and 80 grams of protein.   Marc Gates will adhere more to the plan (80-100%).  Exercise goals:  As is.  Behavioral modification strategies: increasing lean protein intake, decreasing simple carbohydrates, increasing vegetables, increasing water intake, decreasing eating out, no skipping meals, meal planning and cooking strategies, keeping healthy foods in the home, and planning for success.  [name] has agreed to follow-up with our clinic in 4 weeks. He was informed of the importance of frequent follow-up visits to maximize his success with intensive lifestyle modifications for his multiple health conditions.   Marc Gates was informed we would discuss his lab results at his next visit unless there is a critical issue that needs to be addressed sooner. Marc Gates agreed to keep his next visit at the agreed upon time to discuss these results.  Objective:   Blood pressure 115/69, pulse (!) 104, temperature 98.2 F (36.8 C), height 5\' 10"  (1.778 m), weight 237 lb (107.5 kg), SpO2 94 %. Body mass index is 34.01 kg/m.  General: Cooperative, alert, well developed, in no acute distress. HEENT: Conjunctivae and lids unremarkable. Cardiovascular: Regular rhythm.  Lungs: Normal work of breathing. Neurologic: No focal deficits.   Lab Results  Component Value Date   CREATININE 0.87 09/09/2021   BUN  14 09/09/2021   NA 141 09/09/2021   K 4.3 09/09/2021   CL 107 (H) 09/09/2021   CO2 19 (L) 09/09/2021   Lab Results  Component Value Date   ALT 15 09/09/2021   AST 15 09/09/2021   ALKPHOS 91 09/09/2021   BILITOT 0.5 09/09/2021   Lab Results  Component Value Date   HGBA1C 5.9 (H)  09/09/2021   HGBA1C 5.9 (H) 12/24/2020   HGBA1C 6.2 (H) 07/20/2020   HGBA1C 7.5 (H) 04/03/2019   Lab Results  Component Value Date   INSULIN 16.2 09/09/2021   INSULIN 12.0 12/24/2020   INSULIN 40.2 (H) 07/20/2020   Lab Results  Component Value Date   TSH 1.260 05/27/2020   Lab Results  Component Value Date   CHOL 133 09/09/2021   HDL 45 09/09/2021   LDLCALC 58 09/09/2021   TRIG 178 (H) 09/09/2021   Lab Results  Component Value Date   VD25OH 47.1 09/09/2021   VD25OH 60.3 12/24/2020   VD25OH 40.0 05/27/2020   Lab Results  Component Value Date   WBC 9.8 03/09/2021   HGB 14.3 04/06/2021   HCT 42.0 04/06/2021   MCV 91 03/09/2021   PLT 226 03/09/2021   No results found for: IRON, TIBC, FERRITIN  Attestation Statements:   Reviewed by clinician on day of visit: allergies, medications, problem list, medical history, surgical history, family history, social history, and previous encounter notes.  I, Jackson Latino, RMA, am acting as Energy manager for [employer], DO.  I have reviewed the above documentation for accuracy and completeness, and I agree with the above. [name], DO

## 2021-10-27 ENCOUNTER — Encounter (INDEPENDENT_AMBULATORY_CARE_PROVIDER_SITE_OTHER): Payer: Self-pay | Admitting: Bariatrics

## 2021-11-02 DIAGNOSIS — J301 Allergic rhinitis due to pollen: Secondary | ICD-10-CM | POA: Diagnosis not present

## 2021-11-04 DIAGNOSIS — J301 Allergic rhinitis due to pollen: Secondary | ICD-10-CM | POA: Diagnosis not present

## 2021-11-09 DIAGNOSIS — J301 Allergic rhinitis due to pollen: Secondary | ICD-10-CM | POA: Diagnosis not present

## 2021-11-11 DIAGNOSIS — I10 Essential (primary) hypertension: Secondary | ICD-10-CM | POA: Diagnosis not present

## 2021-11-11 DIAGNOSIS — E669 Obesity, unspecified: Secondary | ICD-10-CM | POA: Diagnosis not present

## 2021-11-11 DIAGNOSIS — E1169 Type 2 diabetes mellitus with other specified complication: Secondary | ICD-10-CM | POA: Diagnosis not present

## 2021-11-11 DIAGNOSIS — I2510 Atherosclerotic heart disease of native coronary artery without angina pectoris: Secondary | ICD-10-CM | POA: Diagnosis not present

## 2021-11-11 DIAGNOSIS — F410 Panic disorder [episodic paroxysmal anxiety] without agoraphobia: Secondary | ICD-10-CM | POA: Diagnosis not present

## 2021-11-11 DIAGNOSIS — E785 Hyperlipidemia, unspecified: Secondary | ICD-10-CM | POA: Diagnosis not present

## 2021-11-11 DIAGNOSIS — K219 Gastro-esophageal reflux disease without esophagitis: Secondary | ICD-10-CM | POA: Diagnosis not present

## 2021-11-11 DIAGNOSIS — Z0000 Encounter for general adult medical examination without abnormal findings: Secondary | ICD-10-CM | POA: Diagnosis not present

## 2021-11-11 DIAGNOSIS — F5101 Primary insomnia: Secondary | ICD-10-CM | POA: Diagnosis not present

## 2021-11-11 DIAGNOSIS — Z1389 Encounter for screening for other disorder: Secondary | ICD-10-CM | POA: Diagnosis not present

## 2021-11-11 DIAGNOSIS — I700 Atherosclerosis of aorta: Secondary | ICD-10-CM | POA: Diagnosis not present

## 2021-11-15 DIAGNOSIS — J301 Allergic rhinitis due to pollen: Secondary | ICD-10-CM | POA: Diagnosis not present

## 2021-11-22 ENCOUNTER — Other Ambulatory Visit: Payer: Self-pay

## 2021-11-22 ENCOUNTER — Ambulatory Visit (INDEPENDENT_AMBULATORY_CARE_PROVIDER_SITE_OTHER): Payer: Medicare PPO | Admitting: Bariatrics

## 2021-11-22 ENCOUNTER — Encounter (INDEPENDENT_AMBULATORY_CARE_PROVIDER_SITE_OTHER): Payer: Self-pay | Admitting: Bariatrics

## 2021-11-22 VITALS — BP 132/86 | HR 67 | Temp 98.0°F | Ht 70.0 in | Wt 238.0 lb

## 2021-11-22 DIAGNOSIS — Z6834 Body mass index (BMI) 34.0-34.9, adult: Secondary | ICD-10-CM

## 2021-11-22 DIAGNOSIS — E1159 Type 2 diabetes mellitus with other circulatory complications: Secondary | ICD-10-CM | POA: Diagnosis not present

## 2021-11-22 DIAGNOSIS — J301 Allergic rhinitis due to pollen: Secondary | ICD-10-CM | POA: Diagnosis not present

## 2021-11-22 DIAGNOSIS — E1169 Type 2 diabetes mellitus with other specified complication: Secondary | ICD-10-CM

## 2021-11-22 DIAGNOSIS — I152 Hypertension secondary to endocrine disorders: Secondary | ICD-10-CM

## 2021-11-23 ENCOUNTER — Encounter (INDEPENDENT_AMBULATORY_CARE_PROVIDER_SITE_OTHER): Payer: Self-pay | Admitting: Bariatrics

## 2021-11-23 NOTE — Progress Notes (Signed)
Chief Complaint:   OBESITY Marc Gates is here to discuss his progress with his obesity treatment plan along with follow-up of his obesity related diagnoses. Marc Gates is on keeping a food journal and adhering to recommended goals of 1700 calories and 100 plus grams of protein and states he is following his eating plan approximately 80-85% of the time. Marc Gates states he is walking for 4-5 miles 2 times per week.  Today's visit was #: 22 Starting weight: 263 lbs Starting date: 05/27/2020 Today's weight: 238 lbs Today's date: 11/22/2021 Total lbs lost to date: 25 lbs Total lbs lost since last in-office visit: 0  Interim History: Marc Gates is up 1 additional pound since his last visit.  Subjective:   1. Type 2 diabetes mellitus with other specified complication, without long-term current use of insulin (HCC) Marc Gates is currently using Ozempic and Glucophage XR.  2. Hypertension associated with type 2 diabetes mellitus ([place]) Marc Gates is taking his medications as directed. His blood pressure is reasonably well controlled. His last blood pressure was 115/69.  Assessment/Plan:   1. Type 2 diabetes mellitus with other specified complication, without long-term current use of insulin (HCC) Marc Gates will keep carbohydrates low. He will continue to work on plan and exercise. Good blood sugar control is important to decrease the likelihood of diabetic complications such as nephropathy, neuropathy, limb loss, blindness, coronary artery disease, and death. Intensive lifestyle modification including diet, exercise and weight loss are the first line of treatment for diabetes.   2. Hypertension associated with type 2 diabetes mellitus ([place]) Marc Gates will continue his medications. He is working on healthy weight loss and exercise to improve blood pressure control. We will watch for signs of hypotension as he continues his lifestyle modifications.  3. Obesity, current BMI 34.2 Marc Gates is currently in the action stage of  change. As such, his goal is to continue with weight loss efforts. He has agreed to keeping a food journal and adhering to recommended goals of 1200 calories and 100 plus grams of protein.   Marc Gates will continue meal planning and he will continue intentional eating. He increase protein.  Exercise goals:  As is. Marc Gates has been doing less exercise due to Covid.  Behavioral modification strategies: increasing lean protein intake, decreasing simple carbohydrates, increasing vegetables, increasing water intake, decreasing eating out, no skipping meals, meal planning and cooking strategies, keeping healthy foods in the home, and planning for success.  Marc Gates has agreed to follow-up with our clinic in 3 weeks. He was informed of the importance of frequent follow-up visits to maximize his success with intensive lifestyle modifications for his multiple health conditions.   Objective:   Blood pressure 132/86, pulse 67, temperature 98 F (36.7 C), height 5\' 10"  (1.778 m), weight 238 lb (108 kg), SpO2 95 %. Body mass index is 34.15 kg/m.  General: Cooperative, alert, well developed, in no acute distress. HEENT: Conjunctivae and lids unremarkable. Cardiovascular: Regular rhythm.  Lungs: Normal work of breathing. Neurologic: No focal deficits.   Lab Results  Component Value Date   CREATININE 0.87 09/09/2021   BUN 14 09/09/2021   NA 141 09/09/2021   K 4.3 09/09/2021   CL 107 (H) 09/09/2021   CO2 19 (L) 09/09/2021   Lab Results  Component Value Date   ALT 15 09/09/2021   AST 15 09/09/2021   ALKPHOS 91 09/09/2021   BILITOT 0.5 09/09/2021   Lab Results  Component Value Date   HGBA1C 5.9 (H) 09/09/2021   HGBA1C 5.9 (H)  12/24/2020   HGBA1C 6.2 (H) 07/20/2020   HGBA1C 7.5 (H) 04/03/2019   Lab Results  Component Value Date   INSULIN 16.2 09/09/2021   INSULIN 12.0 12/24/2020   INSULIN 40.2 (H) 07/20/2020   Lab Results  Component Value Date   TSH 1.260 05/27/2020   Lab Results   Component Value Date   CHOL 133 09/09/2021   HDL 45 09/09/2021   LDLCALC 58 09/09/2021   TRIG 178 (H) 09/09/2021   Lab Results  Component Value Date   VD25OH 47.1 09/09/2021   VD25OH 60.3 12/24/2020   VD25OH 40.0 05/27/2020   Lab Results  Component Value Date   WBC 9.8 03/09/2021   HGB 14.3 04/06/2021   HCT 42.0 04/06/2021   MCV 91 03/09/2021   PLT 226 03/09/2021   No results found for: IRON, TIBC, FERRITIN  Attestation Statements:   Reviewed by clinician on day of visit: allergies, medications, problem list, medical history, surgical history, family history, social history, and previous encounter notes.  I, Lizbeth Bark, RMA, am acting as Location manager for [employer], DO.  I have reviewed the above documentation for accuracy and completeness, and I agree with the above. Jearld Lesch, DO

## 2021-11-25 DIAGNOSIS — E1169 Type 2 diabetes mellitus with other specified complication: Secondary | ICD-10-CM | POA: Diagnosis not present

## 2021-11-25 DIAGNOSIS — I10 Essential (primary) hypertension: Secondary | ICD-10-CM | POA: Diagnosis not present

## 2021-11-25 DIAGNOSIS — K219 Gastro-esophageal reflux disease without esophagitis: Secondary | ICD-10-CM | POA: Diagnosis not present

## 2021-11-25 DIAGNOSIS — N401 Enlarged prostate with lower urinary tract symptoms: Secondary | ICD-10-CM | POA: Diagnosis not present

## 2021-11-25 DIAGNOSIS — I2510 Atherosclerotic heart disease of native coronary artery without angina pectoris: Secondary | ICD-10-CM | POA: Diagnosis not present

## 2021-11-25 DIAGNOSIS — E785 Hyperlipidemia, unspecified: Secondary | ICD-10-CM | POA: Diagnosis not present

## 2021-11-29 DIAGNOSIS — J301 Allergic rhinitis due to pollen: Secondary | ICD-10-CM | POA: Diagnosis not present

## 2021-12-06 DIAGNOSIS — E1142 Type 2 diabetes mellitus with diabetic polyneuropathy: Secondary | ICD-10-CM | POA: Diagnosis not present

## 2021-12-06 DIAGNOSIS — J301 Allergic rhinitis due to pollen: Secondary | ICD-10-CM | POA: Diagnosis not present

## 2021-12-06 DIAGNOSIS — B351 Tinea unguium: Secondary | ICD-10-CM | POA: Diagnosis not present

## 2021-12-06 DIAGNOSIS — L603 Nail dystrophy: Secondary | ICD-10-CM | POA: Diagnosis not present

## 2021-12-06 DIAGNOSIS — L84 Corns and callosities: Secondary | ICD-10-CM | POA: Diagnosis not present

## 2021-12-06 DIAGNOSIS — E1151 Type 2 diabetes mellitus with diabetic peripheral angiopathy without gangrene: Secondary | ICD-10-CM | POA: Diagnosis not present

## 2021-12-13 DIAGNOSIS — J301 Allergic rhinitis due to pollen: Secondary | ICD-10-CM | POA: Diagnosis not present

## 2021-12-16 DIAGNOSIS — E785 Hyperlipidemia, unspecified: Secondary | ICD-10-CM | POA: Diagnosis not present

## 2021-12-16 DIAGNOSIS — E1169 Type 2 diabetes mellitus with other specified complication: Secondary | ICD-10-CM | POA: Diagnosis not present

## 2021-12-17 DIAGNOSIS — H5213 Myopia, bilateral: Secondary | ICD-10-CM | POA: Diagnosis not present

## 2021-12-17 DIAGNOSIS — H52223 Regular astigmatism, bilateral: Secondary | ICD-10-CM | POA: Diagnosis not present

## 2021-12-17 DIAGNOSIS — H524 Presbyopia: Secondary | ICD-10-CM | POA: Diagnosis not present

## 2021-12-17 DIAGNOSIS — H00014 Hordeolum externum left upper eyelid: Secondary | ICD-10-CM | POA: Diagnosis not present

## 2021-12-20 ENCOUNTER — Ambulatory Visit (INDEPENDENT_AMBULATORY_CARE_PROVIDER_SITE_OTHER): Payer: Medicare PPO | Admitting: Bariatrics

## 2021-12-20 ENCOUNTER — Other Ambulatory Visit (INDEPENDENT_AMBULATORY_CARE_PROVIDER_SITE_OTHER): Payer: Self-pay | Admitting: Bariatrics

## 2021-12-20 ENCOUNTER — Other Ambulatory Visit: Payer: Self-pay

## 2021-12-20 ENCOUNTER — Encounter (INDEPENDENT_AMBULATORY_CARE_PROVIDER_SITE_OTHER): Payer: Self-pay | Admitting: Bariatrics

## 2021-12-20 VITALS — BP 130/81 | HR 86 | Temp 97.8°F | Ht 70.0 in | Wt 235.0 lb

## 2021-12-20 DIAGNOSIS — Z6833 Body mass index (BMI) 33.0-33.9, adult: Secondary | ICD-10-CM

## 2021-12-20 DIAGNOSIS — E669 Obesity, unspecified: Secondary | ICD-10-CM | POA: Diagnosis not present

## 2021-12-20 DIAGNOSIS — E7849 Other hyperlipidemia: Secondary | ICD-10-CM

## 2021-12-20 DIAGNOSIS — E559 Vitamin D deficiency, unspecified: Secondary | ICD-10-CM | POA: Diagnosis not present

## 2021-12-20 DIAGNOSIS — J301 Allergic rhinitis due to pollen: Secondary | ICD-10-CM | POA: Diagnosis not present

## 2021-12-20 MED ORDER — VITAMIN D (ERGOCALCIFEROL) 1.25 MG (50000 UNIT) PO CAPS: 50000.0000 [IU] | ORAL_CAPSULE | ORAL | 0 refills | Status: DC

## 2021-12-20 NOTE — Telephone Encounter (Signed)
Please review

## 2021-12-20 NOTE — Telephone Encounter (Signed)
LOV w/Brown

## 2021-12-21 NOTE — Progress Notes (Signed)
Chief Complaint:   OBESITY Marc Gates is here to discuss his progress with his obesity treatment plan along with follow-up of his obesity related diagnoses. Marc Gates is on keeping a food journal and adhering to recommended goals of 1700 calories and 100 grams of protein and states he is following his eating plan approximately 70% of the time. Marc Gates states he is walking f4 times per week.  Today's visit was #: 23 Starting weight: 263 lbs Starting date: 05/27/2020 Today's weight: 235 lbs Today's date: 12/20/2021 Total lbs lost to date: 28 lbs Total lbs lost since last in-office visit: 3 lbs  Interim History: Marc Gates is down 3 lbs since his last visit and is trying to get back on track. He has been more active. He denies being active.  Subjective:   1. Vitamin D deficiency Marc Gates is taking Vitamin D currently.  2. Other hyperlipidemia Marc Gates is currently taking Lipitor.  Assessment/Plan:   1. Vitamin D deficiency Low Vitamin D level contributes to fatigue and are associated with obesity, breast, and colon cancer. We will refill prescription Vitamin D 50,000 IU every week for 1 month with no refills and Connie will follow-up for routine testing of Vitamin D, at least 2-3 times per year to avoid over-replacement.  2. Other hyperlipidemia Cardiovascular risk and specific lipid/LDL goals reviewed.  Marc Gates will continue taking Lipitor. We discussed several lifestyle modifications today and Marc Gates will continue to work on diet, exercise and weight loss efforts. Orders and follow up as documented in patient record.   Counseling Intensive lifestyle modifications are the first line treatment for this issue. Dietary changes: Increase soluble fiber. Decrease simple carbohydrates. Exercise changes: Moderate to vigorous-intensity aerobic activity 150 minutes per week if tolerated. Lipid-lowering medications: see documented in medical record.  3. Obesity, current BMI 33.7 Marc Gates is currently in the action  stage of change. As such, his goal is to continue with weight loss efforts. He has agreed to keeping a food journal and adhering to recommended goals of 1700 calories and 100 grams of protein.   Marc Gates will continue meal planning and he will continue intentional eating. He will start journaling and he will increase his vegetables. Handout on protein was provided today.  Exercise goals:  As is.  Behavioral modification strategies: increasing lean protein intake, decreasing simple carbohydrates, increasing vegetables, increasing water intake, decreasing eating out, no skipping meals, meal planning and cooking strategies, keeping healthy foods in the home, and planning for success.  Marc Gates has agreed to follow-up with our clinic in 4 weeks. He was informed of the importance of frequent follow-up visits to maximize his success with intensive lifestyle modifications for his multiple health conditions.   Objective:   Blood pressure 130/81, pulse 86, temperature 97.8 F (36.6 C), height 5\' 10"  (1.778 m), weight 235 lb (106.6 kg), SpO2 96 %. Body mass index is 33.72 kg/m.  General: Cooperative, alert, well developed, in no acute distress. HEENT: Conjunctivae and lids unremarkable. Cardiovascular: Regular rhythm.  Lungs: Normal work of breathing. Neurologic: No focal deficits.   Lab Results  Component Value Date   CREATININE 0.87 09/09/2021   BUN 14 09/09/2021   NA 141 09/09/2021   K 4.3 09/09/2021   CL 107 (H) 09/09/2021   CO2 19 (L) 09/09/2021   Lab Results  Component Value Date   ALT 15 09/09/2021   AST 15 09/09/2021   ALKPHOS 91 09/09/2021   BILITOT 0.5 09/09/2021   Lab Results  Component Value Date   HGBA1C  5.9 (H) 09/09/2021   HGBA1C 5.9 (H) 12/24/2020   HGBA1C 6.2 (H) 07/20/2020   HGBA1C 7.5 (H) 04/03/2019   Lab Results  Component Value Date   INSULIN 16.2 09/09/2021   INSULIN 12.0 12/24/2020   INSULIN 40.2 (H) 07/20/2020   Lab Results  Component Value Date   TSH  1.260 05/27/2020   Lab Results  Component Value Date   CHOL 133 09/09/2021   HDL 45 09/09/2021   LDLCALC 58 09/09/2021   TRIG 178 (H) 09/09/2021   Lab Results  Component Value Date   VD25OH 47.1 09/09/2021   VD25OH 60.3 12/24/2020   VD25OH 40.0 05/27/2020   Lab Results  Component Value Date   WBC 9.8 03/09/2021   HGB 14.3 04/06/2021   HCT 42.0 04/06/2021   MCV 91 03/09/2021   PLT 226 03/09/2021   No results found for: IRON, TIBC, FERRITIN  Attestation Statements:   Reviewed by clinician on day of visit: allergies, medications, problem list, medical history, surgical history, family history, social history, and previous encounter notes.  I, Jackson Latino, RMA, am acting as Energy manager for [employer], DO.  I have reviewed the above documentation for accuracy and completeness, and I agree with the above. [name], DO

## 2021-12-22 ENCOUNTER — Encounter (INDEPENDENT_AMBULATORY_CARE_PROVIDER_SITE_OTHER): Payer: Self-pay | Admitting: Bariatrics

## 2021-12-22 DIAGNOSIS — M170 Bilateral primary osteoarthritis of knee: Secondary | ICD-10-CM | POA: Diagnosis not present

## 2021-12-27 DIAGNOSIS — J301 Allergic rhinitis due to pollen: Secondary | ICD-10-CM | POA: Diagnosis not present

## 2021-12-29 DIAGNOSIS — M170 Bilateral primary osteoarthritis of knee: Secondary | ICD-10-CM | POA: Diagnosis not present

## 2021-12-31 ENCOUNTER — Telehealth: Payer: Self-pay | Admitting: Pulmonary Disease

## 2021-12-31 NOTE — Telephone Encounter (Signed)
Called and spoke with patient and he stated he normally gets CT scans every year.And I informed patient that I cant put in a CT order with out the doctors permission. Patent has an appointment with Dr Isaiah Serge on monday 01/03/22. I advised patient to talk with doctor on Monday about a new CT scan. Patient verbalized understanding and had no further questions noted. Nothing further needed

## 2022-01-03 ENCOUNTER — Encounter: Payer: Self-pay | Admitting: Pulmonary Disease

## 2022-01-03 ENCOUNTER — Ambulatory Visit (INDEPENDENT_AMBULATORY_CARE_PROVIDER_SITE_OTHER): Payer: Medicare PPO

## 2022-01-03 ENCOUNTER — Ambulatory Visit: Payer: Medicare PPO | Admitting: Pulmonary Disease

## 2022-01-03 ENCOUNTER — Other Ambulatory Visit: Payer: Self-pay

## 2022-01-03 VITALS — BP 140/80 | HR 89 | Temp 98.0°F | Ht 70.0 in | Wt 242.4 lb

## 2022-01-03 DIAGNOSIS — R0602 Shortness of breath: Secondary | ICD-10-CM | POA: Diagnosis not present

## 2022-01-03 DIAGNOSIS — G4733 Obstructive sleep apnea (adult) (pediatric): Secondary | ICD-10-CM | POA: Diagnosis not present

## 2022-01-03 DIAGNOSIS — J90 Pleural effusion, not elsewhere classified: Secondary | ICD-10-CM | POA: Diagnosis not present

## 2022-01-03 DIAGNOSIS — J301 Allergic rhinitis due to pollen: Secondary | ICD-10-CM | POA: Diagnosis not present

## 2022-01-03 NOTE — Patient Instructions (Signed)
We will get a chest x-ray today for reevaluation of the lung.  I do not believe you require annual CT scan unless the chest x-ray shows any new abnormality Continue using the albuterol as needed Continue exercise regimen Follow-up in 6 months

## 2022-01-03 NOTE — Progress Notes (Signed)
Marc Gates    [id number]    Mar 26, 1951  Primary Care Physician:Griffin, Jonny Ruiz, MD  Referring Physician: Kirby Funk, MD 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  Occasional nonproductive cough.   He has been taken off valsartan by primary care for a few weeks with no improvement in cough.  Has also start Pepcid and started Protonix for GERD treatment.  Started on Breo by his primary care a few weeks ago but stopped due to sore throat.  He has history of seasonal allergies, GERD, his daytime sleepiness with snoring.  He has been evaluated with sleep studies in the past which were negative. Started on Breo by his primary care but stopped due to sore throat.  Pets: Has a dog.  No cats, birds, farm animals Occupation: Retired Audiological scientist professor Exposures: No known exposures.  No mold, hot tub, Jacuzzi.  He has a down pillow and comforter which he had for many years Smoking history: 20-pack-year smoker.  Quit in 1985 Travel history: Recently lived in [place] and [place].  No significant recent travel Relevant family history: No significant family history of lung disease  Interim history:    Outpatient Encounter Medications as of 01/03/2022  Medication Sig   albuterol (VENTOLIN HFA) 108 (90 Base) MCG/ACT inhaler Inhale 1-2 puffs into the lungs every 4 (four) hours as needed for shortness of breath.   ALPRAZolam (XANAX) 0.25 MG tablet Take 1 tablet (0.25 mg total) by mouth 2 (two) times daily as needed for anxiety.   amLODipine-valsartan (EXFORGE) 5-160 MG tablet Take 1 tablet by mouth daily.   aspirin EC 81 MG tablet Take 81 mg by mouth daily.   atorvastatin (LIPITOR) 20 MG  tablet Take 20 mg by mouth every evening.   azelastine (ASTELIN) 0.1 % nasal spray Place 1 spray into both nostrils 2 (two) times daily as needed for rhinitis. Use in each nostril as directed   busPIRone (BUSPAR) 10 MG tablet Take 2 tablets (20 mg total) by mouth 2 (two) times daily.   chlorpheniramine (CHLOR-TRIMETON) 4 MG tablet Take 4 mg by mouth 3 (three) times daily as needed for allergies.   EPINEPHrine 0.3 mg/0.3 mL IJ SOAJ injection Inject 0.3 mg into the muscle as needed for anaphylaxis.    famotidine (PEPCID) 20 MG tablet Take 20 mg by mouth 2 (two) times daily.   finasteride (PROSCAR) 5 MG tablet Take 5 mg by mouth daily.   fluticasone (FLONASE) 50 MCG/ACT nasal spray Place 2 sprays into both nostrils daily as needed for allergies.   glucose blood test strip 1 each by Other route as needed for other. Use as instructed   levocetirizine (XYZAL) 5 MG tablet Take 5 mg by mouth every evening.   metFORMIN (GLUCOPHAGE-XR) 500 MG 24 hr tablet Take 2 tablets (1,000 mg total) by mouth 2 (two) times daily.   mirabegron ER (MYRBETRIQ) 50 MG TB24 tablet Take 50 mg by mouth daily.   montelukast (SINGULAIR) 10 MG tablet Take 10 mg by mouth at bedtime.   Multiple Vitamin (MULTIVITAMIN WITH MINERALS) TABS tablet Take 1 tablet by mouth daily.   ondansetron (ZOFRAN) 4 MG tablet Take 4 mg by mouth every 8 (eight) hours as needed  for nausea or vomiting.   OZEMPIC, 1 MG/DOSE, 4 MG/3ML SOPN Inject into the skin.   pantoprazole (PROTONIX) 40 MG tablet Take 40 mg by mouth 2 (two) times daily.    tadalafil (CIALIS) 5 MG tablet Take 5 mg by mouth daily.   Vitamin D, Ergocalciferol, (DRISDOL) 1.25 MG (50000 UNIT) CAPS capsule TAKE 1 CAPSULE BY MOUTH EVERY 14 DAYS   zolpidem (AMBIEN) 10 MG tablet Take 10 mg by mouth at bedtime.    nitroGLYCERIN (NITROSTAT) 0.4 MG SL tablet Place 1 tablet (0.4 mg total) under the tongue every 5 (five) minutes as needed for chest pain.   No facility-administered encounter  medications on file as of 01/03/2022.   Physical Exam: Blood pressure 120/76, pulse 100, temperature 98 F (36.7 C), temperature source Temporal, height 5\' 11"  (1.803 m), weight 264 lb 13.9 oz (120.1 kg), SpO2 96 %. Gen:      No acute distress HEENT:  EOMI, sclera anicteric Neck:     No masses; no thyromegaly Lungs:    Clear to auscultation bilaterally; normal respiratory effort CV:         Regular rate and rhythm; no murmurs Abd:      + bowel sounds; soft, non-tender; no palpable masses, no distension Ext:    No edema; adequate peripheral perfusion Skin:      Warm and dry; no rash Neuro: alert and oriented x 3 Psych: normal mood and affect  Data Reviewed: Imaging: CT chest 03/28/2019-chronic appearing right-sided effusion with pleural thickening, multiple pulmonary nodules.   High-resolution CT 01/10/2020-no interstitial lung disease, changes of right lung decortication.  Subcentimeter pulmonary nodules which are stable, coronary atherosclerosis Cardiac CT 03/05/2021-trace chronic right effusion and postsurgical changes.  Stable pulmonary nodule I have reviewed the images personally.  PFTs: 03/11/2020 FVC 4.07 [87%], FEV1 3.35 [96%], F/F 82, TLC 6.46 [89%], DLCO 31.60 [10 and 16%] Normal test  Labs: CBC differential 12/31/2019-WBC 15.3, eos 3.1%, absolute eosinophil count 474 IgE 12/31/2019-31 ANA, rheumatoid factor, CCP, hypersensitivity panel 12/31/2019-negative  Sleep PSG 01/17/2020-mild sleep apnea with AHI 9.4, low O2 sat of 87%  CPAP download 03/30/2020-97% compliant, residual AHI 0.41  Assessment:  Evaluation for dyspnea PFTs and CT reviewed with normal test, no clear lung abnormality Suspect dyspnea secondary to body habitus and deconditioning  Off all inhalers Advised weight loss with diet and exercise Chest x-ray today  Lung nodules Stable on follow-up imaging and a likely benign  Mild OSA Continues on AutoSet CPAP Download reviewed with good  compliance.  Plan/Recommendations: Weight loss with diet and exercise Continue CPAP Chest x-ray  04/01/2020 MD [facility] 01/03/2022, 1:36 PM  CC: 01/05/2022, MD

## 2022-01-04 ENCOUNTER — Encounter: Payer: Self-pay | Admitting: Pulmonary Disease

## 2022-01-05 DIAGNOSIS — M170 Bilateral primary osteoarthritis of knee: Secondary | ICD-10-CM | POA: Diagnosis not present

## 2022-01-05 DIAGNOSIS — I951 Orthostatic hypotension: Secondary | ICD-10-CM | POA: Diagnosis not present

## 2022-01-08 ENCOUNTER — Encounter: Payer: Self-pay | Admitting: Pulmonary Disease

## 2022-01-10 DIAGNOSIS — J3089 Other allergic rhinitis: Secondary | ICD-10-CM | POA: Diagnosis not present

## 2022-01-10 DIAGNOSIS — J301 Allergic rhinitis due to pollen: Secondary | ICD-10-CM | POA: Diagnosis not present

## 2022-01-10 DIAGNOSIS — J3081 Allergic rhinitis due to animal (cat) (dog) hair and dander: Secondary | ICD-10-CM | POA: Diagnosis not present

## 2022-01-17 ENCOUNTER — Ambulatory Visit (INDEPENDENT_AMBULATORY_CARE_PROVIDER_SITE_OTHER): Payer: Medicare PPO | Admitting: Bariatrics

## 2022-01-24 DIAGNOSIS — J3089 Other allergic rhinitis: Secondary | ICD-10-CM | POA: Diagnosis not present

## 2022-01-24 DIAGNOSIS — J301 Allergic rhinitis due to pollen: Secondary | ICD-10-CM | POA: Diagnosis not present

## 2022-01-24 DIAGNOSIS — J3081 Allergic rhinitis due to animal (cat) (dog) hair and dander: Secondary | ICD-10-CM | POA: Diagnosis not present

## 2022-01-31 DIAGNOSIS — J301 Allergic rhinitis due to pollen: Secondary | ICD-10-CM | POA: Diagnosis not present

## 2022-01-31 DIAGNOSIS — I951 Orthostatic hypotension: Secondary | ICD-10-CM | POA: Diagnosis not present

## 2022-02-07 DIAGNOSIS — J301 Allergic rhinitis due to pollen: Secondary | ICD-10-CM | POA: Diagnosis not present

## 2022-02-08 DIAGNOSIS — M65311 Trigger thumb, right thumb: Secondary | ICD-10-CM | POA: Diagnosis not present

## 2022-02-08 DIAGNOSIS — M65312 Trigger thumb, left thumb: Secondary | ICD-10-CM | POA: Diagnosis not present

## 2022-02-10 ENCOUNTER — Ambulatory Visit (INDEPENDENT_AMBULATORY_CARE_PROVIDER_SITE_OTHER): Payer: Medicare PPO | Admitting: Bariatrics

## 2022-02-10 ENCOUNTER — Encounter (INDEPENDENT_AMBULATORY_CARE_PROVIDER_SITE_OTHER): Payer: Self-pay | Admitting: Bariatrics

## 2022-02-10 VITALS — BP 134/87 | HR 84 | Temp 97.5°F | Ht 70.0 in | Wt 237.0 lb

## 2022-02-10 DIAGNOSIS — E1169 Type 2 diabetes mellitus with other specified complication: Secondary | ICD-10-CM

## 2022-02-10 DIAGNOSIS — Z6834 Body mass index (BMI) 34.0-34.9, adult: Secondary | ICD-10-CM

## 2022-02-10 DIAGNOSIS — Z7984 Long term (current) use of oral hypoglycemic drugs: Secondary | ICD-10-CM | POA: Diagnosis not present

## 2022-02-10 DIAGNOSIS — E7849 Other hyperlipidemia: Secondary | ICD-10-CM | POA: Diagnosis not present

## 2022-02-10 DIAGNOSIS — E559 Vitamin D deficiency, unspecified: Secondary | ICD-10-CM | POA: Diagnosis not present

## 2022-02-10 DIAGNOSIS — E669 Obesity, unspecified: Secondary | ICD-10-CM | POA: Diagnosis not present

## 2022-02-10 DIAGNOSIS — E66812 Obesity, class 2: Secondary | ICD-10-CM

## 2022-02-10 MED ORDER — VITAMIN D (ERGOCALCIFEROL) 1.25 MG (50000 UNIT) PO CAPS: ORAL_CAPSULE | ORAL | 0 refills | Status: DC

## 2022-02-10 NOTE — Progress Notes (Signed)
? ? ? ?Chief Complaint:  ? ?OBESITY ?Marc Gates is here to discuss his progress with his obesity treatment plan along with follow-up of his obesity related diagnoses. [name] is on keeping a food journal and adhering to recommended goals of 1700-1800 calories and 100 grams of protein and states he is following his eating plan approximately 65-70% of the time. [name] states he is doing 10,000 steps 4-5 times per week. ? ?Today's visit was #: 24 ?Starting weight: 263 lbs ?Starting date: 05/27/2020 ?Today's weight: 237 lbs ?Today's date: 02/10/2022 ?Total lbs lost to date: 26 lbs ?Total lbs lost since last in-office visit: 0 ? ?Interim History: [name] is up 1 lb since his last visit. He has been traveling.  ? ?Subjective:  ? ?1. Type 2 diabetes mellitus with other specified complication, without long-term current use of insulin (HCC) ?[name] is currently taking Metformin and Ozempic.  ? ?2. Other hyperlipidemia ?[name] is taking Lipitor currently.  ? ?3. Vitamin D deficiency ?[name] is currently taking Vitamin D.  ? ?Assessment/Plan:  ? ?1. Type 2 diabetes mellitus with other specified complication, without long-term current use of insulin (HCC) ?[name] will continue taking Metformin and Ozempic. Good blood sugar control is important to decrease the likelihood of diabetic complications such as nephropathy, neuropathy, limb loss, blindness, coronary artery disease, and death. Intensive lifestyle modification including diet, exercise and weight loss are the first line of treatment for diabetes.  ? ?2. Other hyperlipidemia ?Cardiovascular risk and specific lipid/LDL goals reviewed.  [name] will continue taking Lipitor. We discussed several lifestyle modifications today and [name] will continue to work on diet, exercise and weight loss efforts. Orders and follow up as documented in patient record.  ? ?Counseling ?Intensive lifestyle modifications are the first line treatment for this issue. ?Dietary changes: Increase soluble fiber.  Decrease simple carbohydrates. ?Exercise changes: Moderate to vigorous-intensity aerobic activity 150 minutes per week if tolerated. ?Lipid-lowering medications: see documented in medical record. ? ?3. Vitamin D deficiency ?Low Vitamin D level contributes to fatigue and are associated with obesity, breast, and colon cancer. We will refill prescription Vitamin D 50,000 IU every week for 1 month with no refills and Josejulian will follow-up for routine testing of Vitamin D, at least 2-3 times per year to avoid over-replacement. ? ?- Vitamin D, Ergocalciferol, (DRISDOL) 1.25 MG (50000 UNIT) CAPS capsule; TAKE 1 CAPSULE BY MOUTH EVERY 14 DAYS  Dispense: 6 capsule; Refill: 0 ? ?4. Obesity, current BMI 34.1 ?Longino is currently in the action stage of change. As such, his goal is to continue with weight loss efforts. He has agreed to keeping a food journal and adhering to recommended goals of 1700-1800 calories and 100 grams of protein.  ? ?[name] will adhere more closely to the plan 80-90%. He will journal.  ? ?Exercise goals:  As is. [name] is walking more.  ? ?Behavioral modification strategies: increasing lean protein intake, decreasing simple carbohydrates, increasing vegetables, increasing water intake, decreasing eating out, no skipping meals, meal planning and cooking strategies, keeping healthy foods in the home, and planning for success. ? ?[name] has agreed to follow-up with our clinic in 4 weeks. He was informed of the importance of frequent follow-up visits to maximize his success with intensive lifestyle modifications for his multiple health conditions.  ? ?Objective:  ? ?Blood pressure 134/87, pulse 84, temperature (!) 97.5 ?F (36.4 ?C), height 5\' 10"  (1.778 m), weight 237 lb (107.5 kg), SpO2 96 %. ?Body mass index is 34.01 kg/m?. ? ?General: Cooperative, alert, well developed,  in no acute distress. ?HEENT: Conjunctivae and lids unremarkable. ?Cardiovascular: Regular rhythm.  ?Lungs: Normal work of  breathing. ?Neurologic: No focal deficits.  ? ?Lab Results  ?Component Value Date  ? CREATININE 0.87 09/09/2021  ? BUN 14 09/09/2021  ? NA 141 09/09/2021  ? K 4.3 09/09/2021  ? CL 107 (H) 09/09/2021  ? CO2 19 (L) 09/09/2021  ? ?Lab Results  ?Component Value Date  ? ALT 15 09/09/2021  ? AST 15 09/09/2021  ? ALKPHOS 91 09/09/2021  ? BILITOT 0.5 09/09/2021  ? ?Lab Results  ?Component Value Date  ? HGBA1C 5.9 (H) 09/09/2021  ? HGBA1C 5.9 (H) 12/24/2020  ? HGBA1C 6.2 (H) 07/20/2020  ? HGBA1C 7.5 (H) 04/03/2019  ? ?Lab Results  ?Component Value Date  ? INSULIN 16.2 09/09/2021  ? INSULIN 12.0 12/24/2020  ? INSULIN 40.2 (H) 07/20/2020  ? ?Lab Results  ?Component Value Date  ? TSH 1.260 05/27/2020  ? ?Lab Results  ?Component Value Date  ? CHOL 133 09/09/2021  ? HDL 45 09/09/2021  ? LDLCALC 58 09/09/2021  ? TRIG 178 (H) 09/09/2021  ? ?Lab Results  ?Component Value Date  ? VD25OH 47.1 09/09/2021  ? VD25OH 60.3 12/24/2020  ? VD25OH 40.0 05/27/2020  ? ?Lab Results  ?Component Value Date  ? WBC 9.8 03/09/2021  ? HGB 14.3 04/06/2021  ? HCT 42.0 04/06/2021  ? MCV 91 03/09/2021  ? PLT 226 03/09/2021  ? ?No results found for: IRON, TIBC, FERRITIN ? ?Attestation Statements:  ? ?Reviewed by clinician on day of visit: allergies, medications, problem list, medical history, surgical history, family history, social history, and previous encounter notes. ? ?I, Jackson Latino, RMA, am acting as transcriptionist for [employer], DO. ? ?I have reviewed the above documentation for accuracy and completeness, and I agree with the above. [name], DO ? ?

## 2022-02-14 ENCOUNTER — Encounter (INDEPENDENT_AMBULATORY_CARE_PROVIDER_SITE_OTHER): Payer: Self-pay | Admitting: Bariatrics

## 2022-02-14 DIAGNOSIS — Z125 Encounter for screening for malignant neoplasm of prostate: Secondary | ICD-10-CM | POA: Diagnosis not present

## 2022-02-14 DIAGNOSIS — J301 Allergic rhinitis due to pollen: Secondary | ICD-10-CM | POA: Diagnosis not present

## 2022-02-21 DIAGNOSIS — J3089 Other allergic rhinitis: Secondary | ICD-10-CM | POA: Diagnosis not present

## 2022-02-21 DIAGNOSIS — Z125 Encounter for screening for malignant neoplasm of prostate: Secondary | ICD-10-CM | POA: Diagnosis not present

## 2022-02-21 DIAGNOSIS — J3081 Allergic rhinitis due to animal (cat) (dog) hair and dander: Secondary | ICD-10-CM | POA: Diagnosis not present

## 2022-02-21 DIAGNOSIS — J301 Allergic rhinitis due to pollen: Secondary | ICD-10-CM | POA: Diagnosis not present

## 2022-02-21 DIAGNOSIS — N5201 Erectile dysfunction due to arterial insufficiency: Secondary | ICD-10-CM | POA: Diagnosis not present

## 2022-02-21 DIAGNOSIS — R351 Nocturia: Secondary | ICD-10-CM | POA: Diagnosis not present

## 2022-02-21 DIAGNOSIS — R350 Frequency of micturition: Secondary | ICD-10-CM | POA: Diagnosis not present

## 2022-02-28 DIAGNOSIS — J301 Allergic rhinitis due to pollen: Secondary | ICD-10-CM | POA: Diagnosis not present

## 2022-03-04 DIAGNOSIS — E1169 Type 2 diabetes mellitus with other specified complication: Secondary | ICD-10-CM | POA: Diagnosis not present

## 2022-03-04 DIAGNOSIS — I10 Essential (primary) hypertension: Secondary | ICD-10-CM | POA: Diagnosis not present

## 2022-03-04 DIAGNOSIS — H9201 Otalgia, right ear: Secondary | ICD-10-CM | POA: Diagnosis not present

## 2022-03-04 DIAGNOSIS — N400 Enlarged prostate without lower urinary tract symptoms: Secondary | ICD-10-CM | POA: Diagnosis not present

## 2022-03-04 DIAGNOSIS — E781 Pure hyperglyceridemia: Secondary | ICD-10-CM | POA: Diagnosis not present

## 2022-03-04 DIAGNOSIS — K219 Gastro-esophageal reflux disease without esophagitis: Secondary | ICD-10-CM | POA: Diagnosis not present

## 2022-03-04 DIAGNOSIS — R052 Subacute cough: Secondary | ICD-10-CM | POA: Diagnosis not present

## 2022-03-04 DIAGNOSIS — H66001 Acute suppurative otitis media without spontaneous rupture of ear drum, right ear: Secondary | ICD-10-CM | POA: Diagnosis not present

## 2022-03-04 DIAGNOSIS — J301 Allergic rhinitis due to pollen: Secondary | ICD-10-CM | POA: Diagnosis not present

## 2022-03-07 DIAGNOSIS — J301 Allergic rhinitis due to pollen: Secondary | ICD-10-CM | POA: Diagnosis not present

## 2022-03-14 ENCOUNTER — Ambulatory Visit (INDEPENDENT_AMBULATORY_CARE_PROVIDER_SITE_OTHER): Payer: Medicare PPO | Admitting: Bariatrics

## 2022-03-14 ENCOUNTER — Encounter (INDEPENDENT_AMBULATORY_CARE_PROVIDER_SITE_OTHER): Payer: Self-pay | Admitting: Bariatrics

## 2022-03-14 VITALS — BP 105/68 | HR 85 | Temp 97.9°F | Ht 70.0 in | Wt 234.0 lb

## 2022-03-14 DIAGNOSIS — E1169 Type 2 diabetes mellitus with other specified complication: Secondary | ICD-10-CM | POA: Diagnosis not present

## 2022-03-14 DIAGNOSIS — E669 Obesity, unspecified: Secondary | ICD-10-CM

## 2022-03-14 DIAGNOSIS — E66812 Obesity, class 2: Secondary | ICD-10-CM

## 2022-03-14 DIAGNOSIS — E559 Vitamin D deficiency, unspecified: Secondary | ICD-10-CM

## 2022-03-14 DIAGNOSIS — J301 Allergic rhinitis due to pollen: Secondary | ICD-10-CM | POA: Diagnosis not present

## 2022-03-14 DIAGNOSIS — Z6833 Body mass index (BMI) 33.0-33.9, adult: Secondary | ICD-10-CM

## 2022-03-14 DIAGNOSIS — Z7984 Long term (current) use of oral hypoglycemic drugs: Secondary | ICD-10-CM | POA: Diagnosis not present

## 2022-03-14 DIAGNOSIS — I10 Essential (primary) hypertension: Secondary | ICD-10-CM

## 2022-03-14 MED ORDER — VITAMIN D (ERGOCALCIFEROL) 1.25 MG (50000 UNIT) PO CAPS: ORAL_CAPSULE | ORAL | 0 refills | Status: DC

## 2022-03-15 DIAGNOSIS — L84 Corns and callosities: Secondary | ICD-10-CM | POA: Diagnosis not present

## 2022-03-15 DIAGNOSIS — E1142 Type 2 diabetes mellitus with diabetic polyneuropathy: Secondary | ICD-10-CM | POA: Diagnosis not present

## 2022-03-15 DIAGNOSIS — E1151 Type 2 diabetes mellitus with diabetic peripheral angiopathy without gangrene: Secondary | ICD-10-CM | POA: Diagnosis not present

## 2022-03-15 DIAGNOSIS — L603 Nail dystrophy: Secondary | ICD-10-CM | POA: Diagnosis not present

## 2022-03-15 DIAGNOSIS — B351 Tinea unguium: Secondary | ICD-10-CM | POA: Diagnosis not present

## 2022-03-20 NOTE — Progress Notes (Signed)
Chief Complaint:   OBESITY Marc Gates is here to discuss his progress with his obesity treatment plan along with follow-up of his obesity related diagnoses. Marc Gates is on keeping a food journal and adhering to recommended goals of 1700-1800 calories and 100 grams of protein and states he is following his eating plan approximately 70% of the time. Marc Gates states he is walking for 60 minutes 4-5 times per week.  Today's visit was #: 25 Starting weight: 263 lbs Starting date: 05/27/2020 Today's weight: 234 lbs Today's date: 03/14/2022 Total lbs lost to date: 29 lbs Total lbs lost since last in-office visit: 3 lbs  Interim History: Marc Gates is down 3 additional pounds since his last visit.   Subjective:   1. Vitamin D deficiency Marc Gates is taking his Vitamin D as directed.   2. Diabetes mellitus type 2 in obese (HCC) Marc Gates is currently taking Metformin and Ozempic.   3. Essential hypertension Marc Gates is taking Exforge currently. His blood pressure today was 105/68.  Assessment/Plan:   1. Vitamin D deficiency Low Vitamin D level contributes to fatigue and are associated with obesity, breast, and colon cancer. We will refill prescription Vitamin D 50,000 IU every week for 1 month with no refills and Marc Gates will follow-up for routine testing of Vitamin D, at least 2-3 times per year to avoid over-replacement.  - Vitamin D, Ergocalciferol, (DRISDOL) 1.25 MG (50000 UNIT) CAPS capsule; TAKE 1 CAPSULE BY MOUTH EVERY 14 DAYS  Dispense: 6 capsule; Refill: 0  2. Diabetes mellitus type 2 in obese (HCC) Marc Gates will continue taking Metformin and Ozempic. Good blood sugar control is important to decrease the likelihood of diabetic complications such as nephropathy, neuropathy, limb loss, blindness, coronary artery disease, and death. Intensive lifestyle modification including diet, exercise and weight loss are the first line of treatment for diabetes.   3. Essential hypertension Marc Gates will take blood pressure  at home (morning) and then if high or lightheadedness he will take 1/2 -1 tablet of Exforge. He will keep his water intake high. He is working on healthy weight loss and exercise to improve blood pressure control. We will watch for signs of hypotension as he continues his lifestyle modifications.  4. Obesity, Current BMI 33.6 Marc Gates is currently in the action stage of change. As such, his goal is to continue with weight loss efforts. He has agreed to keeping a food journal and adhering to recommended goals of 1700-1800 calories and 100 grams of protein.   Marc Gates will continue meal planning and he will adhere to the plan 80-90%. He will keep protein and water intake high.   Exercise goals:  As is.  Behavioral modification strategies: increasing lean protein intake, decreasing simple carbohydrates, increasing vegetables, increasing water intake, decreasing eating out, no skipping meals, meal planning and cooking strategies, keeping healthy foods in the home, and planning for success.  Marc Gates has agreed to follow-up with our clinic in 4-5 weeks (fasting). He was informed of the importance of frequent follow-up visits to maximize his success with intensive lifestyle modifications for his multiple health conditions.   Objective:   Blood pressure 105/68, pulse 85, temperature 97.9 F (36.6 C), height 5\' 10"  (1.778 m), SpO2 95 %. Body mass index is 34.01 kg/m.  General: Cooperative, alert, well developed, in no acute distress. HEENT: Conjunctivae and lids unremarkable. Cardiovascular: Regular rhythm.  Lungs: Normal work of breathing. Neurologic: No focal deficits.   Lab Results  Component Value Date   CREATININE 0.87 09/09/2021   BUN  14 09/09/2021   NA 141 09/09/2021   K 4.3 09/09/2021   CL 107 (H) 09/09/2021   CO2 19 (L) 09/09/2021   Lab Results  Component Value Date   ALT 15 09/09/2021   AST 15 09/09/2021   ALKPHOS 91 09/09/2021   BILITOT 0.5 09/09/2021   Lab Results  Component  Value Date   HGBA1C 5.9 (H) 09/09/2021   HGBA1C 5.9 (H) 12/24/2020   HGBA1C 6.2 (H) 07/20/2020   HGBA1C 7.5 (H) 04/03/2019   Lab Results  Component Value Date   INSULIN 16.2 09/09/2021   INSULIN 12.0 12/24/2020   INSULIN 40.2 (H) 07/20/2020   Lab Results  Component Value Date   TSH 1.260 05/27/2020   Lab Results  Component Value Date   CHOL 133 09/09/2021   HDL 45 09/09/2021   LDLCALC 58 09/09/2021   TRIG 178 (H) 09/09/2021   Lab Results  Component Value Date   VD25OH 47.1 09/09/2021   VD25OH 60.3 12/24/2020   VD25OH 40.0 05/27/2020   Lab Results  Component Value Date   WBC 9.8 03/09/2021   HGB 14.3 04/06/2021   HCT 42.0 04/06/2021   MCV 91 03/09/2021   PLT 226 03/09/2021   No results found for: IRON, TIBC, FERRITIN  Attestation Statements:   Reviewed by clinician on day of visit: allergies, medications, problem list, medical history, surgical history, family history, social history, and previous encounter notes.  I, Jackson Latino, RMA, am acting as Energy manager for [employer], DO.  I have reviewed the above documentation for accuracy and completeness, and I agree with the above. [name], DO

## 2022-03-21 DIAGNOSIS — J301 Allergic rhinitis due to pollen: Secondary | ICD-10-CM | POA: Diagnosis not present

## 2022-03-28 DIAGNOSIS — J301 Allergic rhinitis due to pollen: Secondary | ICD-10-CM | POA: Diagnosis not present

## 2022-03-31 ENCOUNTER — Encounter (INDEPENDENT_AMBULATORY_CARE_PROVIDER_SITE_OTHER): Payer: Self-pay | Admitting: Bariatrics

## 2022-04-01 DIAGNOSIS — M170 Bilateral primary osteoarthritis of knee: Secondary | ICD-10-CM | POA: Diagnosis not present

## 2022-04-05 DIAGNOSIS — J3089 Other allergic rhinitis: Secondary | ICD-10-CM | POA: Diagnosis not present

## 2022-04-05 DIAGNOSIS — J3081 Allergic rhinitis due to animal (cat) (dog) hair and dander: Secondary | ICD-10-CM | POA: Diagnosis not present

## 2022-04-05 DIAGNOSIS — J301 Allergic rhinitis due to pollen: Secondary | ICD-10-CM | POA: Diagnosis not present

## 2022-04-13 DIAGNOSIS — J3089 Other allergic rhinitis: Secondary | ICD-10-CM | POA: Diagnosis not present

## 2022-04-13 DIAGNOSIS — J301 Allergic rhinitis due to pollen: Secondary | ICD-10-CM | POA: Diagnosis not present

## 2022-04-18 DIAGNOSIS — J301 Allergic rhinitis due to pollen: Secondary | ICD-10-CM | POA: Diagnosis not present

## 2022-04-20 DIAGNOSIS — H35033 Hypertensive retinopathy, bilateral: Secondary | ICD-10-CM | POA: Diagnosis not present

## 2022-04-20 DIAGNOSIS — H40052 Ocular hypertension, left eye: Secondary | ICD-10-CM | POA: Diagnosis not present

## 2022-04-20 DIAGNOSIS — H40012 Open angle with borderline findings, low risk, left eye: Secondary | ICD-10-CM | POA: Diagnosis not present

## 2022-04-20 DIAGNOSIS — H52222 Regular astigmatism, left eye: Secondary | ICD-10-CM | POA: Diagnosis not present

## 2022-04-20 DIAGNOSIS — H524 Presbyopia: Secondary | ICD-10-CM | POA: Diagnosis not present

## 2022-04-20 DIAGNOSIS — J301 Allergic rhinitis due to pollen: Secondary | ICD-10-CM | POA: Diagnosis not present

## 2022-04-20 DIAGNOSIS — I10 Essential (primary) hypertension: Secondary | ICD-10-CM | POA: Diagnosis not present

## 2022-04-20 DIAGNOSIS — H2511 Age-related nuclear cataract, right eye: Secondary | ICD-10-CM | POA: Diagnosis not present

## 2022-04-20 DIAGNOSIS — H5213 Myopia, bilateral: Secondary | ICD-10-CM | POA: Diagnosis not present

## 2022-04-22 DIAGNOSIS — E1169 Type 2 diabetes mellitus with other specified complication: Secondary | ICD-10-CM | POA: Diagnosis not present

## 2022-04-22 DIAGNOSIS — L282 Other prurigo: Secondary | ICD-10-CM | POA: Diagnosis not present

## 2022-04-22 DIAGNOSIS — E785 Hyperlipidemia, unspecified: Secondary | ICD-10-CM | POA: Diagnosis not present

## 2022-04-22 DIAGNOSIS — I10 Essential (primary) hypertension: Secondary | ICD-10-CM | POA: Diagnosis not present

## 2022-04-25 DIAGNOSIS — J301 Allergic rhinitis due to pollen: Secondary | ICD-10-CM | POA: Diagnosis not present

## 2022-04-28 DIAGNOSIS — H40013 Open angle with borderline findings, low risk, bilateral: Secondary | ICD-10-CM | POA: Diagnosis not present

## 2022-04-28 DIAGNOSIS — H524 Presbyopia: Secondary | ICD-10-CM | POA: Diagnosis not present

## 2022-04-28 DIAGNOSIS — H5213 Myopia, bilateral: Secondary | ICD-10-CM | POA: Diagnosis not present

## 2022-04-28 DIAGNOSIS — H40052 Ocular hypertension, left eye: Secondary | ICD-10-CM | POA: Diagnosis not present

## 2022-04-28 DIAGNOSIS — H40012 Open angle with borderline findings, low risk, left eye: Secondary | ICD-10-CM | POA: Diagnosis not present

## 2022-04-28 DIAGNOSIS — H52223 Regular astigmatism, bilateral: Secondary | ICD-10-CM | POA: Diagnosis not present

## 2022-05-02 ENCOUNTER — Encounter (INDEPENDENT_AMBULATORY_CARE_PROVIDER_SITE_OTHER): Payer: Self-pay | Admitting: Bariatrics

## 2022-05-02 ENCOUNTER — Ambulatory Visit (INDEPENDENT_AMBULATORY_CARE_PROVIDER_SITE_OTHER): Payer: Medicare PPO | Admitting: Bariatrics

## 2022-05-02 VITALS — BP 115/69 | HR 67 | Temp 97.7°F | Ht 70.0 in | Wt 233.0 lb

## 2022-05-02 DIAGNOSIS — Z7984 Long term (current) use of oral hypoglycemic drugs: Secondary | ICD-10-CM | POA: Diagnosis not present

## 2022-05-02 DIAGNOSIS — E1169 Type 2 diabetes mellitus with other specified complication: Secondary | ICD-10-CM

## 2022-05-02 DIAGNOSIS — J3081 Allergic rhinitis due to animal (cat) (dog) hair and dander: Secondary | ICD-10-CM | POA: Diagnosis not present

## 2022-05-02 DIAGNOSIS — E669 Obesity, unspecified: Secondary | ICD-10-CM

## 2022-05-02 DIAGNOSIS — Z7985 Long-term (current) use of injectable non-insulin antidiabetic drugs: Secondary | ICD-10-CM

## 2022-05-02 DIAGNOSIS — Z6833 Body mass index (BMI) 33.0-33.9, adult: Secondary | ICD-10-CM

## 2022-05-02 DIAGNOSIS — E559 Vitamin D deficiency, unspecified: Secondary | ICD-10-CM | POA: Diagnosis not present

## 2022-05-02 DIAGNOSIS — J3089 Other allergic rhinitis: Secondary | ICD-10-CM | POA: Diagnosis not present

## 2022-05-02 DIAGNOSIS — J301 Allergic rhinitis due to pollen: Secondary | ICD-10-CM | POA: Diagnosis not present

## 2022-05-02 MED ORDER — VITAMIN D (ERGOCALCIFEROL) 1.25 MG (50000 UNIT) PO CAPS: ORAL_CAPSULE | ORAL | 0 refills | Status: DC

## 2022-05-03 NOTE — Progress Notes (Signed)
Chief Complaint:   OBESITY Marc Gates is here to discuss his progress with his obesity treatment plan along with follow-up of his obesity related diagnoses. Marc Gates is on keeping a food journal and adhering to recommended goals of 1700-1800 calories and 100 grams of protein daily and states he is following his eating plan approximately 80% of the time. Abdulai states he is walking for 60 minutes 5 times per week.  Today's visit was #: 26 Starting weight: 263 lbs Starting date: 05/27/2020 Today's weight: 233 lbs Today's date: 05/02/2022 Total lbs lost to date: 30 Total lbs lost since last in-office visit: 1  Interim History: Marc Gates is down 1 lb since his last visit.   Subjective:   1. Diabetes mellitus type 2 in obese (HCC) Mcadoo is taking Ozempic and metformin. His last A1c was 5.9.  2. Vitamin D deficiency Marc Gates is taking Vitamin D prescription.   Assessment/Plan:   1. Diabetes mellitus type 2 in obese Marc Gates - [facility]) Marc Gates will continue his medications, and will decrease carbohydrates in his diet.  2. Vitamin D deficiency We will refill prescription Vitamin D 50,000 IU every 14 days for 2 months.   - Vitamin D, Ergocalciferol, (DRISDOL) 1.25 MG (50000 UNIT) CAPS capsule; TAKE 1 CAPSULE BY MOUTH EVERY 14 DAYS  Dispense: 6 capsule; Refill: 0  3. Obesity, Current BMI 33.5 Marc Gates is currently in the action stage of change. As such, his goal is to continue with weight loss efforts. He has agreed to keeping a food journal and adhering to recommended goals of 1700-1800 calories and 100 grams of protein daily.   Intentional eating and mindful eating were discussed.  Exercise goals: As is.   Behavioral modification strategies: increasing lean protein intake, decreasing simple carbohydrates, increasing vegetables, increasing water intake, decreasing eating out, no skipping meals, meal planning and cooking strategies, keeping healthy foods in the home, and planning for success.  Marc Gates has agreed to  follow-up with our clinic in 4 weeks. He was informed of the importance of frequent follow-up visits to maximize his success with intensive lifestyle modifications for his multiple health conditions.   Objective:   Blood pressure 115/69, pulse 67, temperature 97.7 F (36.5 C), height 5\' 10"  (1.778 m), weight 223 lb (101.2 kg), SpO2 96 %. Body mass index is 32 kg/m.  General: Cooperative, alert, well developed, in no acute distress. HEENT: Conjunctivae and lids unremarkable. Cardiovascular: Regular rhythm.  Lungs: Normal work of breathing. Neurologic: No focal deficits.   Lab Results  Component Value Date   CREATININE 0.87 09/09/2021   BUN 14 09/09/2021   NA 141 09/09/2021   K 4.3 09/09/2021   CL 107 (H) 09/09/2021   CO2 19 (L) 09/09/2021   Lab Results  Component Value Date   ALT 15 09/09/2021   AST 15 09/09/2021   ALKPHOS 91 09/09/2021   BILITOT 0.5 09/09/2021   Lab Results  Component Value Date   HGBA1C 5.9 (H) 09/09/2021   HGBA1C 5.9 (H) 12/24/2020   HGBA1C 6.2 (H) 07/20/2020   HGBA1C 7.5 (H) 04/03/2019   Lab Results  Component Value Date   INSULIN 16.2 09/09/2021   INSULIN 12.0 12/24/2020   INSULIN 40.2 (H) 07/20/2020   Lab Results  Component Value Date   TSH 1.260 05/27/2020   Lab Results  Component Value Date   CHOL 133 09/09/2021   HDL 45 09/09/2021   LDLCALC 58 09/09/2021   TRIG 178 (H) 09/09/2021   Lab Results  Component Value Date  VD25OH 47.1 09/09/2021   VD25OH 60.3 12/24/2020   VD25OH 40.0 05/27/2020   Lab Results  Component Value Date   WBC 9.8 03/09/2021   HGB 14.3 04/06/2021   HCT 42.0 04/06/2021   MCV 91 03/09/2021   PLT 226 03/09/2021   No results found for: "IRON", "TIBC", "FERRITIN"  Attestation Statements:   Reviewed by clinician on day of visit: allergies, medications, problem list, medical history, surgical history, family history, social history, and previous encounter notes.   [name], am acting as  Energy manager for [employer], DO.  I have reviewed the above documentation for accuracy and completeness, and I agree with the above. [name], DO

## 2022-05-04 ENCOUNTER — Encounter (INDEPENDENT_AMBULATORY_CARE_PROVIDER_SITE_OTHER): Payer: Self-pay | Admitting: Bariatrics

## 2022-05-09 DIAGNOSIS — E1142 Type 2 diabetes mellitus with diabetic polyneuropathy: Secondary | ICD-10-CM | POA: Diagnosis not present

## 2022-05-09 DIAGNOSIS — L03032 Cellulitis of left toe: Secondary | ICD-10-CM | POA: Diagnosis not present

## 2022-05-11 DIAGNOSIS — J301 Allergic rhinitis due to pollen: Secondary | ICD-10-CM | POA: Diagnosis not present

## 2022-05-16 DIAGNOSIS — J3089 Other allergic rhinitis: Secondary | ICD-10-CM | POA: Diagnosis not present

## 2022-05-16 DIAGNOSIS — J3081 Allergic rhinitis due to animal (cat) (dog) hair and dander: Secondary | ICD-10-CM | POA: Diagnosis not present

## 2022-05-16 DIAGNOSIS — J301 Allergic rhinitis due to pollen: Secondary | ICD-10-CM | POA: Diagnosis not present

## 2022-06-01 DIAGNOSIS — J301 Allergic rhinitis due to pollen: Secondary | ICD-10-CM | POA: Diagnosis not present

## 2022-06-06 DIAGNOSIS — J301 Allergic rhinitis due to pollen: Secondary | ICD-10-CM | POA: Diagnosis not present

## 2022-06-13 DIAGNOSIS — J3081 Allergic rhinitis due to animal (cat) (dog) hair and dander: Secondary | ICD-10-CM | POA: Diagnosis not present

## 2022-06-13 DIAGNOSIS — J3089 Other allergic rhinitis: Secondary | ICD-10-CM | POA: Diagnosis not present

## 2022-06-13 DIAGNOSIS — J301 Allergic rhinitis due to pollen: Secondary | ICD-10-CM | POA: Diagnosis not present

## 2022-06-15 ENCOUNTER — Encounter (INDEPENDENT_AMBULATORY_CARE_PROVIDER_SITE_OTHER): Payer: Self-pay

## 2022-06-16 ENCOUNTER — Ambulatory Visit (INDEPENDENT_AMBULATORY_CARE_PROVIDER_SITE_OTHER): Payer: Medicare PPO | Admitting: Bariatrics

## 2022-06-16 ENCOUNTER — Encounter (INDEPENDENT_AMBULATORY_CARE_PROVIDER_SITE_OTHER): Payer: Self-pay | Admitting: Bariatrics

## 2022-06-16 VITALS — BP 121/78 | HR 92 | Temp 97.6°F | Ht 70.0 in | Wt 235.0 lb

## 2022-06-16 DIAGNOSIS — E1151 Type 2 diabetes mellitus with diabetic peripheral angiopathy without gangrene: Secondary | ICD-10-CM | POA: Diagnosis not present

## 2022-06-16 DIAGNOSIS — E669 Obesity, unspecified: Secondary | ICD-10-CM

## 2022-06-16 DIAGNOSIS — I10 Essential (primary) hypertension: Secondary | ICD-10-CM | POA: Diagnosis not present

## 2022-06-16 DIAGNOSIS — Z6833 Body mass index (BMI) 33.0-33.9, adult: Secondary | ICD-10-CM | POA: Diagnosis not present

## 2022-06-16 DIAGNOSIS — E559 Vitamin D deficiency, unspecified: Secondary | ICD-10-CM | POA: Diagnosis not present

## 2022-06-16 DIAGNOSIS — D485 Neoplasm of uncertain behavior of skin: Secondary | ICD-10-CM | POA: Diagnosis not present

## 2022-06-16 DIAGNOSIS — L578 Other skin changes due to chronic exposure to nonionizing radiation: Secondary | ICD-10-CM | POA: Diagnosis not present

## 2022-06-16 DIAGNOSIS — I70203 Unspecified atherosclerosis of native arteries of extremities, bilateral legs: Secondary | ICD-10-CM | POA: Diagnosis not present

## 2022-06-16 DIAGNOSIS — L814 Other melanin hyperpigmentation: Secondary | ICD-10-CM | POA: Diagnosis not present

## 2022-06-16 DIAGNOSIS — D225 Melanocytic nevi of trunk: Secondary | ICD-10-CM | POA: Diagnosis not present

## 2022-06-16 DIAGNOSIS — L821 Other seborrheic keratosis: Secondary | ICD-10-CM | POA: Diagnosis not present

## 2022-06-16 DIAGNOSIS — E1142 Type 2 diabetes mellitus with diabetic polyneuropathy: Secondary | ICD-10-CM | POA: Diagnosis not present

## 2022-06-16 DIAGNOSIS — D1801 Hemangioma of skin and subcutaneous tissue: Secondary | ICD-10-CM | POA: Diagnosis not present

## 2022-06-16 DIAGNOSIS — L820 Inflamed seborrheic keratosis: Secondary | ICD-10-CM | POA: Diagnosis not present

## 2022-06-16 MED ORDER — VITAMIN D (ERGOCALCIFEROL) 1.25 MG (50000 UNIT) PO CAPS: ORAL_CAPSULE | ORAL | 0 refills | Status: DC

## 2022-06-20 DIAGNOSIS — J301 Allergic rhinitis due to pollen: Secondary | ICD-10-CM | POA: Diagnosis not present

## 2022-06-27 DIAGNOSIS — J3089 Other allergic rhinitis: Secondary | ICD-10-CM | POA: Diagnosis not present

## 2022-06-27 DIAGNOSIS — J301 Allergic rhinitis due to pollen: Secondary | ICD-10-CM | POA: Diagnosis not present

## 2022-06-27 DIAGNOSIS — J3081 Allergic rhinitis due to animal (cat) (dog) hair and dander: Secondary | ICD-10-CM | POA: Diagnosis not present

## 2022-06-27 NOTE — Progress Notes (Unsigned)
Chief Complaint:   OBESITY Marc Gates is here to discuss his progress with his obesity treatment plan along with follow-up of his obesity related diagnoses. Marc Gates is on keeping a food journal and adhering to recommended goals of 1700-1800 calories and 100 grams of protein daily and states he is following his eating plan approximately 0% of the time. Marc Gates states he is walking for 60 minutes 5 times per week.  Today's visit was #: 27 Starting weight: 263 lbs Starting date: 05/27/2020 Today's weight: 235 lbs Today's date: 06/16/2022 Total lbs lost to date: 28 Total lbs lost since last in-office visit: 0  Interim History: Marc Gates is up 2 lbs  since his last visit. He has been in [place] for 2 weeks. He has been in a hotel.   Subjective:   1. Vitamin D deficiency Marc Gates is taking prescription vitamin D as directed.  2. Essential hypertension Marc Gates's blood pressure is currently controlled.  Assessment/Plan:   1. Vitamin D deficiency Marc Gates will continue prescription vitamin D 50,000 units once every 14 days, and we will refill for 90 days.  - Vitamin D, Ergocalciferol, (DRISDOL) 1.25 MG (50000 UNIT) CAPS capsule; TAKE 1 CAPSULE BY MOUTH EVERY 14 DAYS  Dispense: 6 capsule; Refill: 0  2. Essential hypertension Marc Gates will continue his medications as directed.   3. Obesity, Current BMI 33.7 Marc Gates is currently in the action stage of change. As such, his goal is to continue with weight loss efforts. He has agreed to keeping a food journal and adhering to recommended goals of 1700-1800 calories and 100 grams of protein daily.   Meal planning and intentional eating were discussed.  Exercise goals: As is.   Behavioral modification strategies: increasing lean protein intake, decreasing simple carbohydrates, increasing vegetables, increasing water intake, decreasing eating out, no skipping meals, meal planning and cooking strategies, keeping healthy foods in the home, and planning for  success.  Marc Gates has agreed to follow-up with our clinic in 6 weeks. He was informed of the importance of frequent follow-up visits to maximize his success with intensive lifestyle modifications for his multiple health conditions.   Objective:   Blood pressure 121/78, pulse 92, temperature 97.6 F (36.4 C), height 5\' 10"  (1.778 m), weight 235 lb (106.6 kg), SpO2 95 %. Body mass index is 33.72 kg/m.  General: Cooperative, alert, well developed, in no acute distress. HEENT: Conjunctivae and lids unremarkable. Cardiovascular: Regular rhythm.  Lungs: Normal work of breathing. Neurologic: No focal deficits.   Lab Results  Component Value Date   CREATININE 0.87 09/09/2021   BUN 14 09/09/2021   NA 141 09/09/2021   K 4.3 09/09/2021   CL 107 (H) 09/09/2021   CO2 19 (L) 09/09/2021   Lab Results  Component Value Date   ALT 15 09/09/2021   AST 15 09/09/2021   ALKPHOS 91 09/09/2021   BILITOT 0.5 09/09/2021   Lab Results  Component Value Date   HGBA1C 5.9 (H) 09/09/2021   HGBA1C 5.9 (H) 12/24/2020   HGBA1C 6.2 (H) 07/20/2020   HGBA1C 7.5 (H) 04/03/2019   Lab Results  Component Value Date   INSULIN 16.2 09/09/2021   INSULIN 12.0 12/24/2020   INSULIN 40.2 (H) 07/20/2020   Lab Results  Component Value Date   TSH 1.260 05/27/2020   Lab Results  Component Value Date   CHOL 133 09/09/2021   HDL 45 09/09/2021   LDLCALC 58 09/09/2021   TRIG 178 (H) 09/09/2021   Lab Results  Component Value Date  VD25OH 47.1 09/09/2021   VD25OH 60.3 12/24/2020   VD25OH 40.0 05/27/2020   Lab Results  Component Value Date   WBC 9.8 03/09/2021   HGB 14.3 04/06/2021   HCT 42.0 04/06/2021   MCV 91 03/09/2021   PLT 226 03/09/2021   No results found for: "IRON", "TIBC", "FERRITIN"  Attestation Statements:   Reviewed by clinician on day of visit: allergies, medications, problem list, medical history, surgical history, family history, social history, and previous encounter notes.   [name], am acting as Energy manager for [employer], DO.  I have reviewed the above documentation for accuracy and completeness, and I agree with the above. [name], DO

## 2022-06-28 ENCOUNTER — Encounter (INDEPENDENT_AMBULATORY_CARE_PROVIDER_SITE_OTHER): Payer: Self-pay | Admitting: Bariatrics

## 2022-06-30 DIAGNOSIS — M65311 Trigger thumb, right thumb: Secondary | ICD-10-CM | POA: Diagnosis not present

## 2022-07-04 DIAGNOSIS — J301 Allergic rhinitis due to pollen: Secondary | ICD-10-CM | POA: Diagnosis not present

## 2022-07-04 DIAGNOSIS — J3081 Allergic rhinitis due to animal (cat) (dog) hair and dander: Secondary | ICD-10-CM | POA: Diagnosis not present

## 2022-07-04 DIAGNOSIS — J3089 Other allergic rhinitis: Secondary | ICD-10-CM | POA: Diagnosis not present

## 2022-07-12 DIAGNOSIS — J3081 Allergic rhinitis due to animal (cat) (dog) hair and dander: Secondary | ICD-10-CM | POA: Diagnosis not present

## 2022-07-12 DIAGNOSIS — J3089 Other allergic rhinitis: Secondary | ICD-10-CM | POA: Diagnosis not present

## 2022-07-12 DIAGNOSIS — J301 Allergic rhinitis due to pollen: Secondary | ICD-10-CM | POA: Diagnosis not present

## 2022-07-18 DIAGNOSIS — J301 Allergic rhinitis due to pollen: Secondary | ICD-10-CM | POA: Diagnosis not present

## 2022-07-18 DIAGNOSIS — J3081 Allergic rhinitis due to animal (cat) (dog) hair and dander: Secondary | ICD-10-CM | POA: Diagnosis not present

## 2022-07-18 DIAGNOSIS — J3089 Other allergic rhinitis: Secondary | ICD-10-CM | POA: Diagnosis not present

## 2022-07-21 ENCOUNTER — Ambulatory Visit: Payer: Medicare PPO | Admitting: Bariatrics

## 2022-07-21 ENCOUNTER — Encounter: Payer: Self-pay | Admitting: Bariatrics

## 2022-07-21 VITALS — BP 114/69 | HR 60 | Temp 97.8°F | Ht 70.0 in | Wt 237.0 lb

## 2022-07-21 DIAGNOSIS — E669 Obesity, unspecified: Secondary | ICD-10-CM | POA: Diagnosis not present

## 2022-07-21 DIAGNOSIS — E7849 Other hyperlipidemia: Secondary | ICD-10-CM | POA: Diagnosis not present

## 2022-07-21 DIAGNOSIS — Z6834 Body mass index (BMI) 34.0-34.9, adult: Secondary | ICD-10-CM | POA: Diagnosis not present

## 2022-07-21 DIAGNOSIS — E559 Vitamin D deficiency, unspecified: Secondary | ICD-10-CM

## 2022-07-21 MED ORDER — VITAMIN D (ERGOCALCIFEROL) 1.25 MG (50000 UNIT) PO CAPS: ORAL_CAPSULE | ORAL | 0 refills | Status: AC

## 2022-07-25 DIAGNOSIS — E7849 Other hyperlipidemia: Secondary | ICD-10-CM | POA: Insufficient documentation

## 2022-07-25 DIAGNOSIS — M25551 Pain in right hip: Secondary | ICD-10-CM | POA: Diagnosis not present

## 2022-07-25 DIAGNOSIS — R0781 Pleurodynia: Secondary | ICD-10-CM | POA: Diagnosis not present

## 2022-07-25 DIAGNOSIS — E559 Vitamin D deficiency, unspecified: Secondary | ICD-10-CM | POA: Insufficient documentation

## 2022-07-25 DIAGNOSIS — J3089 Other allergic rhinitis: Secondary | ICD-10-CM | POA: Diagnosis not present

## 2022-07-25 DIAGNOSIS — M545 Low back pain, unspecified: Secondary | ICD-10-CM | POA: Diagnosis not present

## 2022-07-25 DIAGNOSIS — W19XXXA Unspecified fall, initial encounter: Secondary | ICD-10-CM | POA: Diagnosis not present

## 2022-07-25 DIAGNOSIS — J3081 Allergic rhinitis due to animal (cat) (dog) hair and dander: Secondary | ICD-10-CM | POA: Diagnosis not present

## 2022-07-25 DIAGNOSIS — J301 Allergic rhinitis due to pollen: Secondary | ICD-10-CM | POA: Diagnosis not present

## 2022-07-27 ENCOUNTER — Encounter: Payer: Self-pay | Admitting: Bariatrics

## 2022-07-27 NOTE — Progress Notes (Signed)
Chief Complaint:   OBESITY Marc Gates is here to discuss his progress with his obesity treatment plan along with follow-up of his obesity related diagnoses. Marc Gates is on keeping a food journal and adhering to recommended goals of 1700-1800 calories and 100 grams of protein daily and states he is following his eating plan approximately 0% of the time. Marc Gates states he is walking 8,000 steps daily 5 times per week.    Today's visit was #: 28 Starting weight: 263 lbs Starting date: 05/27/2020 Today's weight: 237 lbs Today's date: 07/21/2022 Total lbs lost to date: 26 Total lbs lost since last in-office visit: 0  Interim History: Marc Gates is up 2 lbs since his last visit. He has been using Trulicity temporarily instead of Ozempic.   Subjective:   1. Vitamin D deficiency Marc Gates is taking Vitamin D.   2. Other hyperlipidemia Marc Gates is taking Lipitor. She had RSV, COVID and Flu on Saturday.   Assessment/Plan:   1. Vitamin D deficiency We will refill prescription Vitamin D for 90 days. Marc Gates will follow-up for routine testing of Vitamin D, at least 2-3 times per year to avoid over-replacement.  - Vitamin D, Ergocalciferol, (DRISDOL) 1.25 MG (50000 UNIT) CAPS capsule; TAKE 1 CAPSULE BY MOUTH EVERY 14 DAYS  Dispense: 6 capsule; Refill: 0  2. Other hyperlipidemia Marc Gates will continue his medications, and he will decrease trans fats.   3. Obesity, Current BMI 34.0 Marc Gates is currently in the action stage of change. As such, his goal is to continue with weight loss efforts. He has agreed to keeping a food journal and adhering to recommended goals of 1700-1800 calories and 100 grams of protein daily.   Meal planning and intentional eating were discussed. Decrease carbohydrate and increase protein.   Exercise goals: As is.   Behavioral modification strategies: increasing lean protein intake, decreasing simple carbohydrates, increasing vegetables, increasing water intake, decreasing eating out, no  skipping meals, meal planning and cooking strategies, keeping healthy foods in the home, and planning for success.  Marc Gates has agreed to follow-up with our clinic in 4 weeks. He was informed of the importance of frequent follow-up visits to maximize his success with intensive lifestyle modifications for his multiple health conditions.   Objective:   Blood pressure 114/69, pulse 60, temperature 97.8 F (36.6 C), height 5\' 10"  (1.778 m), weight 237 lb (107.5 kg), SpO2 96 %. Body mass index is 34.01 kg/m.  General: Cooperative, alert, well developed, in no acute distress. HEENT: Conjunctivae and lids unremarkable. Cardiovascular: Regular rhythm.  Lungs: Normal work of breathing. Neurologic: No focal deficits.   Lab Results  Component Value Date   CREATININE 0.87 09/09/2021   BUN 14 09/09/2021   NA 141 09/09/2021   K 4.3 09/09/2021   CL 107 (H) 09/09/2021   CO2 19 (L) 09/09/2021   Lab Results  Component Value Date   ALT 15 09/09/2021   AST 15 09/09/2021   ALKPHOS 91 09/09/2021   BILITOT 0.5 09/09/2021   Lab Results  Component Value Date   HGBA1C 5.9 (H) 09/09/2021   HGBA1C 5.9 (H) 12/24/2020   HGBA1C 6.2 (H) 07/20/2020   HGBA1C 7.5 (H) 04/03/2019   Lab Results  Component Value Date   INSULIN 16.2 09/09/2021   INSULIN 12.0 12/24/2020   INSULIN 40.2 (H) 07/20/2020   Lab Results  Component Value Date   TSH 1.260 05/27/2020   Lab Results  Component Value Date   CHOL 133 09/09/2021   HDL 45 09/09/2021  LDLCALC 58 09/09/2021   TRIG 178 (H) 09/09/2021   Lab Results  Component Value Date   VD25OH 47.1 09/09/2021   VD25OH 60.3 12/24/2020   VD25OH 40.0 05/27/2020   Lab Results  Component Value Date   WBC 9.8 03/09/2021   HGB 14.3 04/06/2021   HCT 42.0 04/06/2021   MCV 91 03/09/2021   PLT 226 03/09/2021   No results found for: "IRON", "TIBC", "FERRITIN"  Attestation Statements:   Reviewed by clinician on day of visit: allergies, medications, problem list,  medical history, surgical history, family history, social history, and previous encounter notes.   Wilhemena Durie, am acting as Location manager for [employer], DO.  I have reviewed the above documentation for accuracy and completeness, and I agree with the above. Jearld Lesch, DO

## 2022-08-01 DIAGNOSIS — J301 Allergic rhinitis due to pollen: Secondary | ICD-10-CM | POA: Diagnosis not present

## 2022-08-08 DIAGNOSIS — J301 Allergic rhinitis due to pollen: Secondary | ICD-10-CM | POA: Diagnosis not present

## 2022-08-15 DIAGNOSIS — J301 Allergic rhinitis due to pollen: Secondary | ICD-10-CM | POA: Diagnosis not present

## 2022-08-22 DIAGNOSIS — J301 Allergic rhinitis due to pollen: Secondary | ICD-10-CM | POA: Diagnosis not present

## 2022-08-22 DIAGNOSIS — Z9849 Cataract extraction status, unspecified eye: Secondary | ICD-10-CM | POA: Diagnosis not present

## 2022-08-22 DIAGNOSIS — H524 Presbyopia: Secondary | ICD-10-CM | POA: Diagnosis not present

## 2022-08-22 DIAGNOSIS — J3081 Allergic rhinitis due to animal (cat) (dog) hair and dander: Secondary | ICD-10-CM | POA: Diagnosis not present

## 2022-08-22 DIAGNOSIS — Z961 Presence of intraocular lens: Secondary | ICD-10-CM | POA: Diagnosis not present

## 2022-08-22 DIAGNOSIS — H35373 Puckering of macula, bilateral: Secondary | ICD-10-CM | POA: Diagnosis not present

## 2022-08-22 DIAGNOSIS — H52222 Regular astigmatism, left eye: Secondary | ICD-10-CM | POA: Diagnosis not present

## 2022-08-22 DIAGNOSIS — J3089 Other allergic rhinitis: Secondary | ICD-10-CM | POA: Diagnosis not present

## 2022-08-22 DIAGNOSIS — H5213 Myopia, bilateral: Secondary | ICD-10-CM | POA: Diagnosis not present

## 2022-08-29 DIAGNOSIS — J3081 Allergic rhinitis due to animal (cat) (dog) hair and dander: Secondary | ICD-10-CM | POA: Diagnosis not present

## 2022-08-29 DIAGNOSIS — J301 Allergic rhinitis due to pollen: Secondary | ICD-10-CM | POA: Diagnosis not present

## 2022-08-29 DIAGNOSIS — J3089 Other allergic rhinitis: Secondary | ICD-10-CM | POA: Diagnosis not present

## 2022-09-01 ENCOUNTER — Ambulatory Visit: Payer: Medicare PPO | Admitting: Nurse Practitioner

## 2022-09-05 DIAGNOSIS — J301 Allergic rhinitis due to pollen: Secondary | ICD-10-CM | POA: Diagnosis not present

## 2022-09-05 DIAGNOSIS — J3081 Allergic rhinitis due to animal (cat) (dog) hair and dander: Secondary | ICD-10-CM | POA: Diagnosis not present

## 2022-09-05 DIAGNOSIS — J3089 Other allergic rhinitis: Secondary | ICD-10-CM | POA: Diagnosis not present

## 2022-09-12 DIAGNOSIS — J301 Allergic rhinitis due to pollen: Secondary | ICD-10-CM | POA: Diagnosis not present

## 2022-09-12 DIAGNOSIS — J3089 Other allergic rhinitis: Secondary | ICD-10-CM | POA: Diagnosis not present

## 2022-09-12 DIAGNOSIS — J3081 Allergic rhinitis due to animal (cat) (dog) hair and dander: Secondary | ICD-10-CM | POA: Diagnosis not present

## 2022-09-19 DIAGNOSIS — J301 Allergic rhinitis due to pollen: Secondary | ICD-10-CM | POA: Diagnosis not present

## 2022-09-19 DIAGNOSIS — J3089 Other allergic rhinitis: Secondary | ICD-10-CM | POA: Diagnosis not present

## 2022-09-19 DIAGNOSIS — J3081 Allergic rhinitis due to animal (cat) (dog) hair and dander: Secondary | ICD-10-CM | POA: Diagnosis not present

## 2022-09-20 DIAGNOSIS — B351 Tinea unguium: Secondary | ICD-10-CM | POA: Diagnosis not present

## 2022-09-20 DIAGNOSIS — M792 Neuralgia and neuritis, unspecified: Secondary | ICD-10-CM | POA: Diagnosis not present

## 2022-09-20 DIAGNOSIS — L851 Acquired keratosis [keratoderma] palmaris et plantaris: Secondary | ICD-10-CM | POA: Diagnosis not present

## 2022-09-20 DIAGNOSIS — E1142 Type 2 diabetes mellitus with diabetic polyneuropathy: Secondary | ICD-10-CM | POA: Diagnosis not present

## 2022-09-20 DIAGNOSIS — I70203 Unspecified atherosclerosis of native arteries of extremities, bilateral legs: Secondary | ICD-10-CM | POA: Diagnosis not present

## 2022-09-20 DIAGNOSIS — E1151 Type 2 diabetes mellitus with diabetic peripheral angiopathy without gangrene: Secondary | ICD-10-CM | POA: Diagnosis not present

## 2022-09-20 DIAGNOSIS — L603 Nail dystrophy: Secondary | ICD-10-CM | POA: Diagnosis not present

## 2022-09-22 DIAGNOSIS — J301 Allergic rhinitis due to pollen: Secondary | ICD-10-CM | POA: Diagnosis not present

## 2022-09-26 DIAGNOSIS — J3081 Allergic rhinitis due to animal (cat) (dog) hair and dander: Secondary | ICD-10-CM | POA: Diagnosis not present

## 2022-09-26 DIAGNOSIS — J3089 Other allergic rhinitis: Secondary | ICD-10-CM | POA: Diagnosis not present

## 2022-09-26 DIAGNOSIS — J301 Allergic rhinitis due to pollen: Secondary | ICD-10-CM | POA: Diagnosis not present

## 2022-10-03 DIAGNOSIS — J301 Allergic rhinitis due to pollen: Secondary | ICD-10-CM | POA: Diagnosis not present

## 2022-10-03 DIAGNOSIS — J3089 Other allergic rhinitis: Secondary | ICD-10-CM | POA: Diagnosis not present

## 2022-10-03 DIAGNOSIS — J3081 Allergic rhinitis due to animal (cat) (dog) hair and dander: Secondary | ICD-10-CM | POA: Diagnosis not present

## 2022-10-10 DIAGNOSIS — J301 Allergic rhinitis due to pollen: Secondary | ICD-10-CM | POA: Diagnosis not present

## 2022-10-10 DIAGNOSIS — J3089 Other allergic rhinitis: Secondary | ICD-10-CM | POA: Diagnosis not present

## 2022-10-10 DIAGNOSIS — J3081 Allergic rhinitis due to animal (cat) (dog) hair and dander: Secondary | ICD-10-CM | POA: Diagnosis not present

## 2022-10-13 ENCOUNTER — Other Ambulatory Visit: Payer: Self-pay | Admitting: Bariatrics

## 2022-10-13 DIAGNOSIS — E559 Vitamin D deficiency, unspecified: Secondary | ICD-10-CM

## 2022-10-17 DIAGNOSIS — L821 Other seborrheic keratosis: Secondary | ICD-10-CM | POA: Diagnosis not present

## 2022-10-17 DIAGNOSIS — J3089 Other allergic rhinitis: Secondary | ICD-10-CM | POA: Diagnosis not present

## 2022-10-17 DIAGNOSIS — L820 Inflamed seborrheic keratosis: Secondary | ICD-10-CM | POA: Diagnosis not present

## 2022-10-17 DIAGNOSIS — J301 Allergic rhinitis due to pollen: Secondary | ICD-10-CM | POA: Diagnosis not present

## 2022-10-17 DIAGNOSIS — J3081 Allergic rhinitis due to animal (cat) (dog) hair and dander: Secondary | ICD-10-CM | POA: Diagnosis not present

## 2022-11-01 DIAGNOSIS — J3089 Other allergic rhinitis: Secondary | ICD-10-CM | POA: Diagnosis not present

## 2022-11-01 DIAGNOSIS — J301 Allergic rhinitis due to pollen: Secondary | ICD-10-CM | POA: Diagnosis not present

## 2022-11-01 DIAGNOSIS — J3081 Allergic rhinitis due to animal (cat) (dog) hair and dander: Secondary | ICD-10-CM | POA: Diagnosis not present

## 2022-11-08 DIAGNOSIS — J3081 Allergic rhinitis due to animal (cat) (dog) hair and dander: Secondary | ICD-10-CM | POA: Diagnosis not present

## 2022-11-08 DIAGNOSIS — J301 Allergic rhinitis due to pollen: Secondary | ICD-10-CM | POA: Diagnosis not present

## 2022-11-08 DIAGNOSIS — J3089 Other allergic rhinitis: Secondary | ICD-10-CM | POA: Diagnosis not present

## 2022-11-14 DIAGNOSIS — J3089 Other allergic rhinitis: Secondary | ICD-10-CM | POA: Diagnosis not present

## 2022-11-14 DIAGNOSIS — N401 Enlarged prostate with lower urinary tract symptoms: Secondary | ICD-10-CM | POA: Diagnosis not present

## 2022-11-14 DIAGNOSIS — G4733 Obstructive sleep apnea (adult) (pediatric): Secondary | ICD-10-CM | POA: Diagnosis not present

## 2022-11-14 DIAGNOSIS — I2510 Atherosclerotic heart disease of native coronary artery without angina pectoris: Secondary | ICD-10-CM | POA: Diagnosis not present

## 2022-11-14 DIAGNOSIS — Z79899 Other long term (current) drug therapy: Secondary | ICD-10-CM | POA: Diagnosis not present

## 2022-11-14 DIAGNOSIS — Z1331 Encounter for screening for depression: Secondary | ICD-10-CM | POA: Diagnosis not present

## 2022-11-14 DIAGNOSIS — I700 Atherosclerosis of aorta: Secondary | ICD-10-CM | POA: Diagnosis not present

## 2022-11-14 DIAGNOSIS — J3081 Allergic rhinitis due to animal (cat) (dog) hair and dander: Secondary | ICD-10-CM | POA: Diagnosis not present

## 2022-11-14 DIAGNOSIS — E1169 Type 2 diabetes mellitus with other specified complication: Secondary | ICD-10-CM | POA: Diagnosis not present

## 2022-11-14 DIAGNOSIS — E781 Pure hyperglyceridemia: Secondary | ICD-10-CM | POA: Diagnosis not present

## 2022-11-14 DIAGNOSIS — E559 Vitamin D deficiency, unspecified: Secondary | ICD-10-CM | POA: Diagnosis not present

## 2022-11-14 DIAGNOSIS — I10 Essential (primary) hypertension: Secondary | ICD-10-CM | POA: Diagnosis not present

## 2022-11-14 DIAGNOSIS — J301 Allergic rhinitis due to pollen: Secondary | ICD-10-CM | POA: Diagnosis not present

## 2022-11-14 DIAGNOSIS — Z0000 Encounter for general adult medical examination without abnormal findings: Secondary | ICD-10-CM | POA: Diagnosis not present

## 2022-11-14 DIAGNOSIS — Z125 Encounter for screening for malignant neoplasm of prostate: Secondary | ICD-10-CM | POA: Diagnosis not present

## 2022-11-15 ENCOUNTER — Ambulatory Visit
Admission: RE | Admit: 2022-11-15 | Discharge: 2022-11-15 | Disposition: A | Discharge status: Home / Self Care | Payer: Medicare PPO | Source: Ambulatory Visit | Attending: Internal Medicine | Admitting: Internal Medicine

## 2022-11-15 ENCOUNTER — Other Ambulatory Visit: Payer: Self-pay | Admitting: Internal Medicine

## 2022-11-15 DIAGNOSIS — R0609 Other forms of dyspnea: Secondary | ICD-10-CM

## 2022-11-15 DIAGNOSIS — R059 Cough, unspecified: Secondary | ICD-10-CM | POA: Diagnosis not present

## 2022-11-21 DIAGNOSIS — J3089 Other allergic rhinitis: Secondary | ICD-10-CM | POA: Diagnosis not present

## 2022-11-21 DIAGNOSIS — J3081 Allergic rhinitis due to animal (cat) (dog) hair and dander: Secondary | ICD-10-CM | POA: Diagnosis not present

## 2022-11-21 DIAGNOSIS — J301 Allergic rhinitis due to pollen: Secondary | ICD-10-CM | POA: Diagnosis not present

## 2022-11-22 DIAGNOSIS — J029 Acute pharyngitis, unspecified: Secondary | ICD-10-CM | POA: Diagnosis not present

## 2022-11-22 DIAGNOSIS — Z03818 Encounter for observation for suspected exposure to other biological agents ruled out: Secondary | ICD-10-CM | POA: Diagnosis not present

## 2022-11-28 DIAGNOSIS — J301 Allergic rhinitis due to pollen: Secondary | ICD-10-CM | POA: Diagnosis not present

## 2022-11-28 DIAGNOSIS — J3089 Other allergic rhinitis: Secondary | ICD-10-CM | POA: Diagnosis not present

## 2022-11-28 DIAGNOSIS — J3081 Allergic rhinitis due to animal (cat) (dog) hair and dander: Secondary | ICD-10-CM | POA: Diagnosis not present

## 2022-11-30 DIAGNOSIS — R0609 Other forms of dyspnea: Secondary | ICD-10-CM | POA: Diagnosis not present

## 2022-12-05 DIAGNOSIS — J3089 Other allergic rhinitis: Secondary | ICD-10-CM | POA: Diagnosis not present

## 2022-12-05 DIAGNOSIS — J3081 Allergic rhinitis due to animal (cat) (dog) hair and dander: Secondary | ICD-10-CM | POA: Diagnosis not present

## 2022-12-05 DIAGNOSIS — J301 Allergic rhinitis due to pollen: Secondary | ICD-10-CM | POA: Diagnosis not present

## 2022-12-12 DIAGNOSIS — J3089 Other allergic rhinitis: Secondary | ICD-10-CM | POA: Diagnosis not present

## 2022-12-12 DIAGNOSIS — J3081 Allergic rhinitis due to animal (cat) (dog) hair and dander: Secondary | ICD-10-CM | POA: Diagnosis not present

## 2022-12-12 DIAGNOSIS — J301 Allergic rhinitis due to pollen: Secondary | ICD-10-CM | POA: Diagnosis not present

## 2022-12-16 DIAGNOSIS — M170 Bilateral primary osteoarthritis of knee: Secondary | ICD-10-CM | POA: Diagnosis not present

## 2022-12-16 DIAGNOSIS — M65311 Trigger thumb, right thumb: Secondary | ICD-10-CM | POA: Diagnosis not present

## 2022-12-16 DIAGNOSIS — H40053 Ocular hypertension, bilateral: Secondary | ICD-10-CM | POA: Diagnosis not present

## 2022-12-16 DIAGNOSIS — M1711 Unilateral primary osteoarthritis, right knee: Secondary | ICD-10-CM | POA: Diagnosis not present

## 2022-12-16 DIAGNOSIS — M1712 Unilateral primary osteoarthritis, left knee: Secondary | ICD-10-CM | POA: Diagnosis not present

## 2022-12-19 DIAGNOSIS — J301 Allergic rhinitis due to pollen: Secondary | ICD-10-CM | POA: Diagnosis not present

## 2022-12-19 DIAGNOSIS — J3081 Allergic rhinitis due to animal (cat) (dog) hair and dander: Secondary | ICD-10-CM | POA: Diagnosis not present

## 2022-12-19 DIAGNOSIS — J3089 Other allergic rhinitis: Secondary | ICD-10-CM | POA: Diagnosis not present

## 2022-12-21 DIAGNOSIS — K219 Gastro-esophageal reflux disease without esophagitis: Secondary | ICD-10-CM | POA: Diagnosis not present

## 2022-12-21 DIAGNOSIS — R053 Chronic cough: Secondary | ICD-10-CM | POA: Diagnosis not present

## 2022-12-21 DIAGNOSIS — J309 Allergic rhinitis, unspecified: Secondary | ICD-10-CM | POA: Diagnosis not present

## 2022-12-22 DIAGNOSIS — B351 Tinea unguium: Secondary | ICD-10-CM | POA: Diagnosis not present

## 2022-12-22 DIAGNOSIS — L609 Nail disorder, unspecified: Secondary | ICD-10-CM | POA: Diagnosis not present

## 2022-12-22 DIAGNOSIS — M792 Neuralgia and neuritis, unspecified: Secondary | ICD-10-CM | POA: Diagnosis not present

## 2022-12-22 DIAGNOSIS — L603 Nail dystrophy: Secondary | ICD-10-CM | POA: Diagnosis not present

## 2022-12-22 DIAGNOSIS — I70203 Unspecified atherosclerosis of native arteries of extremities, bilateral legs: Secondary | ICD-10-CM | POA: Diagnosis not present

## 2022-12-22 DIAGNOSIS — L851 Acquired keratosis [keratoderma] palmaris et plantaris: Secondary | ICD-10-CM | POA: Diagnosis not present

## 2022-12-22 DIAGNOSIS — E1142 Type 2 diabetes mellitus with diabetic polyneuropathy: Secondary | ICD-10-CM | POA: Diagnosis not present

## 2022-12-22 DIAGNOSIS — E1151 Type 2 diabetes mellitus with diabetic peripheral angiopathy without gangrene: Secondary | ICD-10-CM | POA: Diagnosis not present

## 2022-12-28 DIAGNOSIS — L601 Onycholysis: Secondary | ICD-10-CM | POA: Diagnosis not present

## 2023-01-02 DIAGNOSIS — J3081 Allergic rhinitis due to animal (cat) (dog) hair and dander: Secondary | ICD-10-CM | POA: Diagnosis not present

## 2023-01-02 DIAGNOSIS — J3089 Other allergic rhinitis: Secondary | ICD-10-CM | POA: Diagnosis not present

## 2023-01-02 DIAGNOSIS — J301 Allergic rhinitis due to pollen: Secondary | ICD-10-CM | POA: Diagnosis not present

## 2023-01-09 DIAGNOSIS — J3089 Other allergic rhinitis: Secondary | ICD-10-CM | POA: Diagnosis not present

## 2023-01-09 DIAGNOSIS — R053 Chronic cough: Secondary | ICD-10-CM | POA: Diagnosis not present

## 2023-01-09 DIAGNOSIS — J3081 Allergic rhinitis due to animal (cat) (dog) hair and dander: Secondary | ICD-10-CM | POA: Diagnosis not present

## 2023-01-09 DIAGNOSIS — J301 Allergic rhinitis due to pollen: Secondary | ICD-10-CM | POA: Diagnosis not present

## 2023-01-09 DIAGNOSIS — K219 Gastro-esophageal reflux disease without esophagitis: Secondary | ICD-10-CM | POA: Diagnosis not present

## 2023-01-11 DIAGNOSIS — G4733 Obstructive sleep apnea (adult) (pediatric): Secondary | ICD-10-CM | POA: Diagnosis not present

## 2023-01-13 DIAGNOSIS — K2100 Gastro-esophageal reflux disease with esophagitis, without bleeding: Secondary | ICD-10-CM | POA: Diagnosis not present

## 2023-01-13 DIAGNOSIS — K2270 Barrett's esophagus without dysplasia: Secondary | ICD-10-CM | POA: Diagnosis not present

## 2023-01-13 DIAGNOSIS — K2930 Chronic superficial gastritis without bleeding: Secondary | ICD-10-CM | POA: Diagnosis not present

## 2023-01-13 DIAGNOSIS — R053 Chronic cough: Secondary | ICD-10-CM | POA: Diagnosis not present

## 2023-01-13 DIAGNOSIS — K2970 Gastritis, unspecified, without bleeding: Secondary | ICD-10-CM | POA: Diagnosis not present

## 2023-01-13 DIAGNOSIS — K2289 Other specified disease of esophagus: Secondary | ICD-10-CM | POA: Diagnosis not present

## 2023-01-15 NOTE — Progress Notes (Unsigned)
Cardiology Office Note:   Date:  01/16/2023  NAME:  Marc Gates    MRN: [id number] DOB:  1951/10/27   PCP:  Lavone Orn, MD  Cardiologist:  None  Electrophysiologist:  None   Referring MD: Lavone Orn, MD   Chief Complaint  Patient presents with   Follow-up    History of Present Illness:   Marc Gates is a 72 y.o. male with a hx of non-obstructive CAD, obesity, OSA who presents for follow-up.  He reports he is doing well.  Denies any chest pain or trouble breathing.  He is exercising.  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  Overall doing well.  No heart racing.  He is working on losing weight.  BP is stable.  He struggling with dizziness.  Appears to be positional.  Worsened with head movement.  Can also occur with standing.  Appear to be inner ear related.  Blood pressure has not been too low.  Denies any symptoms of angina.  Cholesterol levels were acceptable.   Problem List 1. DM -A1c 5.7 2. HTN 3. HLD -T chol 102, HDL 47, TG 146, LDL 30 4. Obesity -BMI 33 5. OSA 6. R empyema s/p thoracotomy (PNA) 7. Former tobacco abuse  -20 pack years  63. CAD -CAC 823 (83rd percentile) -abnormal MPI -LHC 25% pLAD 04/06/2021 -LVEDP normal 04/06/2021  Past Medical History: Past Medical History:  Diagnosis Date   Allergies    Anxiety    CPAP (continuous positive airway pressure) dependence    Diabetes mellitus without complication (HCC)    Edema of both lower extremities    Empyema (HCC)    GERD (gastroesophageal reflux disease)    Hepatitis 1980'S   HEPATITIS B   High cholesterol    Hypertension    Joint pain    Pneumonia    Sleep apnea    SOB (shortness of breath)     Past Surgical History: Past Surgical History:  Procedure Laterality Date   APPENDECTOMY  AGE 10 OR 13   COLONOSCOPY WITH PROPOFOL N/A 08/13/2013   Procedure: COLONOSCOPY WITH PROPOFOL;  Surgeon: Garlan Fair, MD;  Location: [facility];  Service: Endoscopy;   Laterality: N/A;   DECORTICATION Right 04/03/2019   Procedure: DECORTICATION OF RIGHT LUNG;  Surgeon: Gaye Pollack, MD;  Location: [facility];  Service: Thoracic;  Laterality: Right;   EMPYEMA DRAINAGE Right 04/03/2019   Procedure: EMPYEMA DRAINAGE;  Surgeon: Gaye Pollack, MD;  Location: [facility];  Service: Thoracic;  Laterality: Right;   EYE SURGERY Left    retinal detachment and cataract surgery   HERNIA REPAIR  50 WEEKS OLD   IR THORACENTESIS ASP PLEURAL SPACE W/IMG GUIDE  03/07/2019   LEFT HEART CATH AND CORONARY ANGIOGRAPHY N/A 04/06/2021   Procedure: LEFT HEART CATH AND CORONARY ANGIOGRAPHY;  Surgeon: Martinique, Peter M, MD;  Location: [facility];  Service: Cardiovascular;  Laterality: N/A;   PAIN PUMP IMPLANTATION  04/03/2019   Procedure: PLACEMENT OF ON-Q PAIN PUMP;  Surgeon: Gaye Pollack, MD;  Location: [facility];  Service: Thoracic;;   THORACOTOMY Right 04/03/2019   Procedure: THORACOTOMY MAJOR;  Surgeon: Gaye Pollack, MD;  Location: [facility];  Service: Thoracic;  Laterality: Right;    Current Medications: Current Meds  Medication Sig   albuterol (VENTOLIN HFA) 108 (90 Base) MCG/ACT inhaler Inhale 1-2 puffs into the lungs every 4 (four) hours as needed for shortness of breath.   ALPRAZolam Duanne Moron)  0.25 MG tablet Take 1 tablet (0.25 mg total) by mouth 2 (two) times daily as needed for anxiety.   amLODipine-valsartan (EXFORGE) 5-160 MG tablet Take 1 tablet by mouth daily.   aspirin EC 81 MG tablet Take 81 mg by mouth daily.   atorvastatin (LIPITOR) 20 MG tablet Take 20 mg by mouth every evening.   azelastine (ASTELIN) 0.1 % nasal spray Place 1 spray into both nostrils 2 (two) times daily as needed for rhinitis. Use in each nostril as directed   chlorpheniramine (CHLOR-TRIMETON) 4 MG tablet Take 4 mg by mouth 3 (three) times daily as needed for allergies.   EPINEPHrine 0.3 mg/0.3 mL IJ SOAJ injection Inject 0.3 mg into the muscle as needed for anaphylaxis.   famotidine (PEPCID) 20 MG  tablet Take 20 mg by mouth 2 (two) times daily.   finasteride (PROSCAR) 5 MG tablet Take 5 mg by mouth daily.   fluticasone (FLONASE) 50 MCG/ACT nasal spray Place 2 sprays into both nostrils daily as needed for allergies.   glucose blood test strip 1 each by Other route as needed for other. Use as instructed   levocetirizine (XYZAL) 5 MG tablet Take 5 mg by mouth every evening.   metFORMIN (GLUCOPHAGE-XR) 500 MG 24 hr tablet Take 2 tablets (1,000 mg total) by mouth 2 (two) times daily.   mirabegron ER (MYRBETRIQ) 50 MG TB24 tablet Take 50 mg by mouth daily.   montelukast (SINGULAIR) 10 MG tablet Take 10 mg by mouth at bedtime.   MOUNJARO 2.5 MG/0.5ML Pen Inject 2.5 mg into the skin once a week.   Multiple Vitamin (MULTIVITAMIN WITH MINERALS) TABS tablet Take 1 tablet by mouth daily.   nitroGLYCERIN (NITROSTAT) 0.4 MG SL tablet Place 1 tablet (0.4 mg total) under the tongue every 5 (five) minutes as needed for chest pain.   ondansetron (ZOFRAN) 4 MG tablet Take 4 mg by mouth every 8 (eight) hours as needed for nausea or vomiting.   pantoprazole (PROTONIX) 40 MG tablet Take 40 mg by mouth 2 (two) times daily.    sucralfate (CARAFATE) 1 g tablet Take 1 g by mouth 2 (two) times daily.   tadalafil (CIALIS) 5 MG tablet Take 5 mg by mouth daily as needed.   Vitamin D, Ergocalciferol, (DRISDOL) 1.25 MG (50000 UNIT) CAPS capsule TAKE 1 CAPSULE BY MOUTH EVERY 14 DAYS   zolpidem (AMBIEN) 10 MG tablet Take 10 mg by mouth at bedtime.      Allergies:    Patient has no known allergies.   Social History: Social History   Socioeconomic History   Marital status: Single    Spouse name: Not on file   Number of children: 0   Years of education: Not on file   Highest education level: Not on file  Occupational History   Occupation: Retired Press photographer Professor  Tobacco Use   Smoking status: Former    Packs/day: 1.50    Years: 15.00    Total pack years: 22.50    Types: Cigarettes    Passive exposure:  Past   Smokeless tobacco: Never  Vaping Use   Vaping Use: Never used  Substance and Sexual Activity   Alcohol use: Yes    Comment: rare   Drug use: No    Comment: QUIT SMOKING 25 YRS AGO   Sexual activity: Not on file  Other Topics Concern   Not on file  Social History Narrative   Not on file   Social Determinants of Radio broadcast assistant  Strain: Not on file  Food Insecurity: Not on file  Transportation Needs: Not on file  Physical Activity: Not on file  Stress: Not on file  Social Connections: Not on file     Family History: The patient's family history includes Anxiety disorder in his mother; Cancer in his father; Diabetes in his father; Heart disease in his mother; High Cholesterol in his father and mother; High blood pressure in his mother; Obesity in his mother.  ROS:   All other ROS reviewed and negative. Pertinent positives noted in the HPI.     EKGs/Labs/Other Studies Reviewed:   The following studies were personally reviewed by me today:  EKG:  EKG is ordered today.  The ekg ordered today demonstrates normal sinus rhythm heart rate 83, PAC noted, left axis deviation, and was personally reviewed by me.   LHC 04/06/2021 Prox LAD to Mid LAD lesion is 25% stenosed. LV end diastolic pressure is normal.   1. Mild nonobstructive CAD 2. Normal LVEDP  Recent Labs: No results found for requested labs within last 365 days.   Recent Lipid Panel    Component Value Date/Time   CHOL 133 09/09/2021 0825   TRIG 178 (H) 09/09/2021 0825   HDL 45 09/09/2021 0825   LDLCALC 58 09/09/2021 0825    Physical Exam:   VS:  BP 116/78 (BP Location: Left Arm, Patient Position: Sitting, Cuff Size: Large)   Pulse 83   Ht '5\' 10"'$  (1.778 m)   Wt 246 lb 3.2 oz (111.7 kg)   BMI 35.33 kg/m    Wt Readings from Last 3 Encounters:  01/16/23 246 lb 3.2 oz (111.7 kg)  07/21/22 237 lb (107.5 kg)  06/16/22 235 lb (106.6 kg)    General: Well nourished, well developed, in no acute  distress Head: Atraumatic, normal size  Eyes: PEERLA, EOMI  Neck: Supple, no JVD Endocrine: No thryomegaly Cardiac: Normal S1, S2; RRR; no murmurs, rubs, or gallops Lungs: Clear to auscultation bilaterally, no wheezing, rhonchi or rales  Abd: Soft, nontender, no hepatomegaly  Ext: No edema, pulses 2+ Musculoskeletal: No deformities, BUE and BLE strength normal and equal Skin: Warm and dry, no rashes   Neuro: Alert and oriented to person, place, time, and situation, CNII-XII grossly intact, no focal deficits  Psych: Normal mood and affect   ASSESSMENT:   Marc Gates is a 72 y.o. male who presents for the following: 1. Coronary artery disease involving native coronary artery of native heart without angina pectoris   2. Agatston coronary artery calcium score greater than 400   3. Mixed hyperlipidemia     PLAN:   1. Coronary artery disease involving native coronary artery of native heart without angina pectoris 2. Agatston coronary artery calcium score greater than 400 3. Mixed hyperlipidemia -Elevated coronary calcium score.  Left heart catheterization with nonobstructive disease.  Continue aspirin 81 mg daily.  On Lipitor 20 mg daily. Lipid profile was not at goal but he was not fasting.  Will continue Lipitor 20 mg daily.  Blood pressure seems to be acceptable.  No change in medications.  He does report dizziness today but this appears to be vertigo related.  His EKG is unchanged.  He will continue to see [name] yearly.      Disposition: Return in about 1 year (around 01/16/2024).  Medication Adjustments/Labs and Tests Ordered: Current medicines are reviewed at length with the patient today.  Concerns regarding medicines are outlined above.  Orders Placed This Encounter  Procedures  EKG 12-Lead   No orders of the defined types were placed in this encounter.   Patient Instructions  Medication Instructions:  The current medical regimen is effective;  continue present plan and  medications.  *If you need a refill on your cardiac medications before your next appointment, please call your pharmacy*   Follow-Up: At [facility], you and your health needs are our priority.  As part of our continuing mission to provide you with exceptional heart care, we have created designated Provider Care Teams.  These Care Teams include your primary Cardiologist (physician) and Advanced Practice Providers (APPs -  Physician Assistants and Nurse Practitioners) who all work together to provide you with the care you need, when you need it.  We recommend signing up for the patient portal called "MyChart".  Sign up information is provided on this After Visit Summary.  MyChart is used to connect with patients for Virtual Visits (Telemedicine).  Patients are able to view lab/test results, encounter notes, upcoming appointments, etc.  Non-urgent messages can be sent to your provider as well.   To learn more about what you can do with MyChart, go to [email or web address].    Your next appointment:   12 month(s)  Provider:   Eleonore Chiquito, MD  Other Instructions Primary Care Provider information:   [facility]  Garret Reddish, MD  Penni Homans, MD     Time Spent with Patient: I have spent a total of 25 minutes with patient reviewing hospital notes, telemetry, EKGs, labs and examining the patient as well as establishing an assessment and plan that was discussed with the patient.  > 50% of time was spent in direct patient care.  Signed, Addison Naegeli. Audie Box, MD, [place]  [address], [place], [place] [address] [phone]  01/16/2023 9:46 AM

## 2023-01-16 ENCOUNTER — Ambulatory Visit: Payer: Medicare PPO | Attending: Cardiovascular Disease | Admitting: Cardiovascular Disease

## 2023-01-16 ENCOUNTER — Encounter: Payer: Self-pay | Admitting: Cardiovascular Disease

## 2023-01-16 VITALS — BP 116/78 | HR 83 | Ht 70.0 in | Wt 246.2 lb

## 2023-01-16 DIAGNOSIS — M1712 Unilateral primary osteoarthritis, left knee: Secondary | ICD-10-CM | POA: Diagnosis not present

## 2023-01-16 DIAGNOSIS — E782 Mixed hyperlipidemia: Secondary | ICD-10-CM | POA: Diagnosis not present

## 2023-01-16 DIAGNOSIS — H8111 Benign paroxysmal vertigo, right ear: Secondary | ICD-10-CM | POA: Diagnosis not present

## 2023-01-16 DIAGNOSIS — I2510 Atherosclerotic heart disease of native coronary artery without angina pectoris: Secondary | ICD-10-CM

## 2023-01-16 DIAGNOSIS — J3081 Allergic rhinitis due to animal (cat) (dog) hair and dander: Secondary | ICD-10-CM | POA: Diagnosis not present

## 2023-01-16 DIAGNOSIS — J3089 Other allergic rhinitis: Secondary | ICD-10-CM | POA: Diagnosis not present

## 2023-01-16 DIAGNOSIS — R931 Abnormal findings on diagnostic imaging of heart and coronary circulation: Secondary | ICD-10-CM

## 2023-01-16 DIAGNOSIS — M1711 Unilateral primary osteoarthritis, right knee: Secondary | ICD-10-CM | POA: Diagnosis not present

## 2023-01-16 DIAGNOSIS — J301 Allergic rhinitis due to pollen: Secondary | ICD-10-CM | POA: Diagnosis not present

## 2023-01-16 DIAGNOSIS — E119 Type 2 diabetes mellitus without complications: Secondary | ICD-10-CM | POA: Diagnosis not present

## 2023-01-16 NOTE — Patient Instructions (Addendum)
Medication Instructions:  The current medical regimen is effective;  continue present plan and medications.  *If you need a refill on your cardiac medications before your next appointment, please call your pharmacy*   Follow-Up: At [facility], you and your health needs are our priority.  As part of our continuing mission to provide you with exceptional heart care, we have created designated Provider Care Teams.  These Care Teams include your primary Cardiologist (physician) and Advanced Practice Providers (APPs -  Physician Assistants and Nurse Practitioners) who all work together to provide you with the care you need, when you need it.  We recommend signing up for the patient portal called "MyChart".  Sign up information is provided on this After Visit Summary.  MyChart is used to connect with patients for Virtual Visits (Telemedicine).  Patients are able to view lab/test results, encounter notes, upcoming appointments, etc.  Non-urgent messages can be sent to your provider as well.   To learn more about what you can do with MyChart, go to [email or web address].    Your next appointment:   12 month(s)  Provider:   Eleonore Chiquito, MD  Other Instructions Primary Care Provider information:   [facility]  Garret Reddish, MD  Penni Homans, MD

## 2023-01-19 DIAGNOSIS — M1711 Unilateral primary osteoarthritis, right knee: Secondary | ICD-10-CM | POA: Diagnosis not present

## 2023-01-19 DIAGNOSIS — M1712 Unilateral primary osteoarthritis, left knee: Secondary | ICD-10-CM | POA: Diagnosis not present

## 2023-01-23 DIAGNOSIS — J3081 Allergic rhinitis due to animal (cat) (dog) hair and dander: Secondary | ICD-10-CM | POA: Diagnosis not present

## 2023-01-23 DIAGNOSIS — J301 Allergic rhinitis due to pollen: Secondary | ICD-10-CM | POA: Diagnosis not present

## 2023-01-23 DIAGNOSIS — J3089 Other allergic rhinitis: Secondary | ICD-10-CM | POA: Diagnosis not present

## 2023-01-23 DIAGNOSIS — M1712 Unilateral primary osteoarthritis, left knee: Secondary | ICD-10-CM | POA: Diagnosis not present

## 2023-01-24 DIAGNOSIS — K2270 Barrett's esophagus without dysplasia: Secondary | ICD-10-CM | POA: Diagnosis not present

## 2023-01-24 DIAGNOSIS — K2930 Chronic superficial gastritis without bleeding: Secondary | ICD-10-CM | POA: Diagnosis not present

## 2023-01-24 DIAGNOSIS — K2100 Gastro-esophageal reflux disease with esophagitis, without bleeding: Secondary | ICD-10-CM | POA: Diagnosis not present

## 2023-01-26 DIAGNOSIS — M1711 Unilateral primary osteoarthritis, right knee: Secondary | ICD-10-CM | POA: Diagnosis not present

## 2023-01-30 DIAGNOSIS — J3081 Allergic rhinitis due to animal (cat) (dog) hair and dander: Secondary | ICD-10-CM | POA: Diagnosis not present

## 2023-01-30 DIAGNOSIS — J3089 Other allergic rhinitis: Secondary | ICD-10-CM | POA: Diagnosis not present

## 2023-01-30 DIAGNOSIS — M1712 Unilateral primary osteoarthritis, left knee: Secondary | ICD-10-CM | POA: Diagnosis not present

## 2023-01-30 DIAGNOSIS — J301 Allergic rhinitis due to pollen: Secondary | ICD-10-CM | POA: Diagnosis not present

## 2023-02-02 DIAGNOSIS — M1711 Unilateral primary osteoarthritis, right knee: Secondary | ICD-10-CM | POA: Diagnosis not present

## 2023-02-06 DIAGNOSIS — J301 Allergic rhinitis due to pollen: Secondary | ICD-10-CM | POA: Diagnosis not present

## 2023-02-06 DIAGNOSIS — J3081 Allergic rhinitis due to animal (cat) (dog) hair and dander: Secondary | ICD-10-CM | POA: Diagnosis not present

## 2023-02-06 DIAGNOSIS — J3089 Other allergic rhinitis: Secondary | ICD-10-CM | POA: Diagnosis not present

## 2023-02-09 IMAGING — CT CT CARDIAC CORONARY ARTERY CALCIUM SCORE
3 series · 14 of 20 positions shown, 15 images · non-contrast
Comparison: Chest CT 01/11/2021 and other priors.
COMPARISON: Chest CT 01/11/2021 and other priors.

Addendum:
EXAM:
OVER-READ INTERPRETATION  CT CHEST

The following report is an over-read performed by radiologist Dr.
Djawida Viki [REDACTED] on 03/05/2021. This
over-read does not include interpretation of cardiac or coronary
anatomy or pathology. The coronary calcium score interpretation by
the cardiologist is attached.
CLINICAL DATA: Cardiovascular Disease Risk stratification
Coronary Calcium Score
TECHNIQUE: A gated, non-contrast computed tomography scan of the heart was
performed using 3mm slice thickness. Axial images were analyzed on a
dedicated workstation. Calcium scoring of the coronary arteries was
performed using the Agatston method.

[Series 2: casc 3.0 bv41 2 bestdiast 74 % · axial · 0.42mm/px · z∈[-242,-152]mm · 4 of 52 slices shown, 5 images]
[im 11/52  vessel]
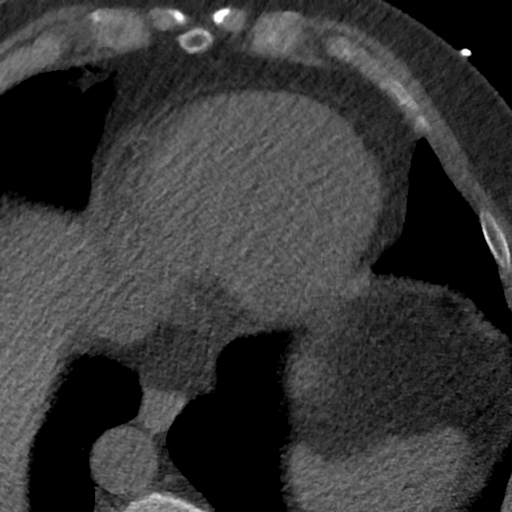
[im 11/52  lung]
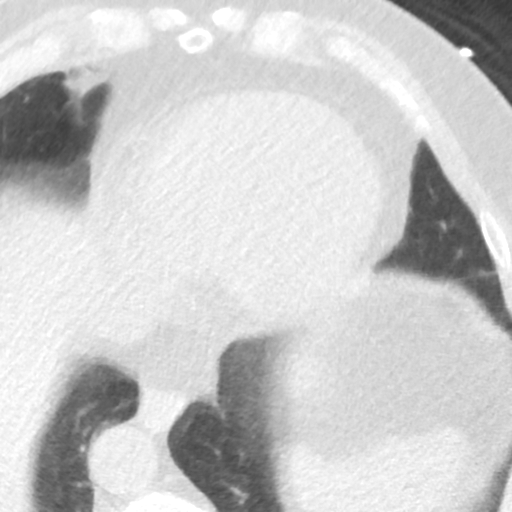
[im 21/52  vessel]
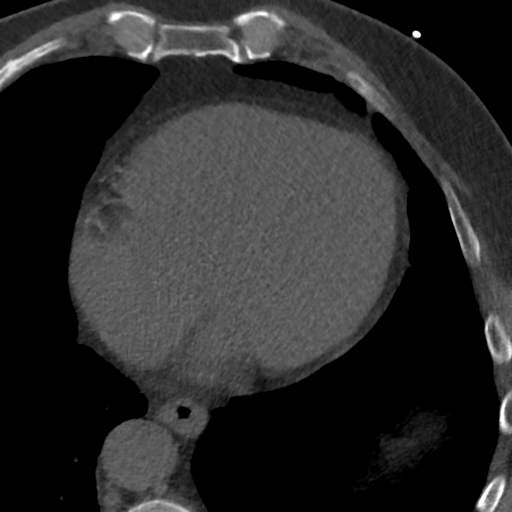
[im 31/52  vessel]
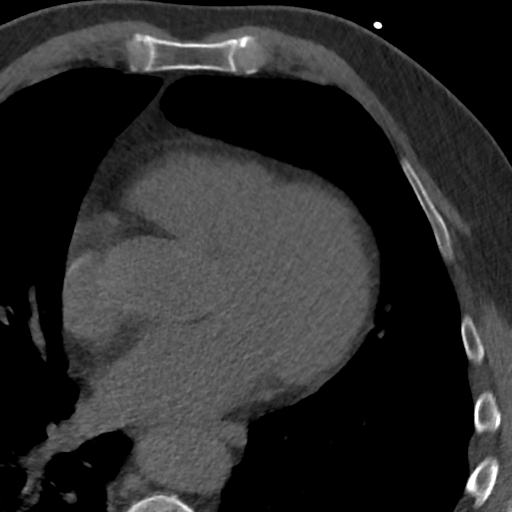
[im 41/52  vessel]
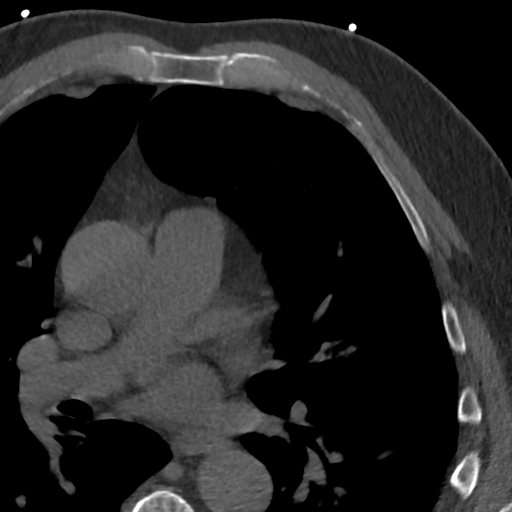

[Series 3: lung 76 % · axial · 0.73mm/px · z∈[-248,-146]mm · 5 of 52 slices shown]
[im 9/52  lung]
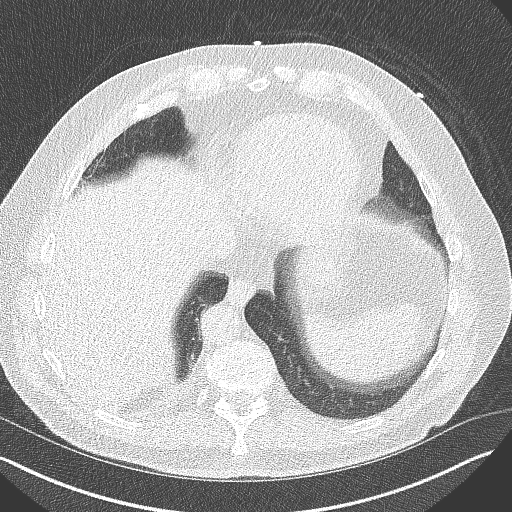
[im 18/52  lung]
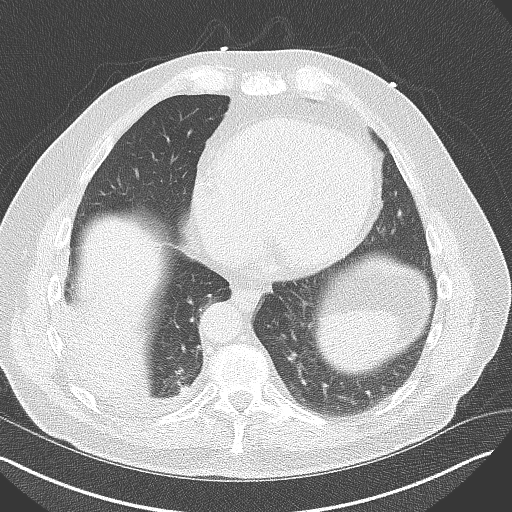
[im 26/52  lung]
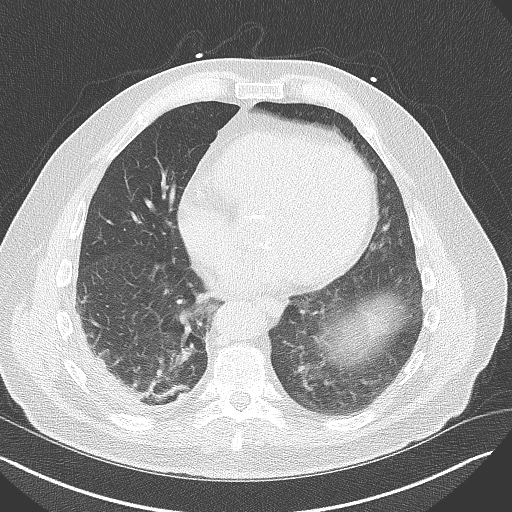
[im 35/52  lung]
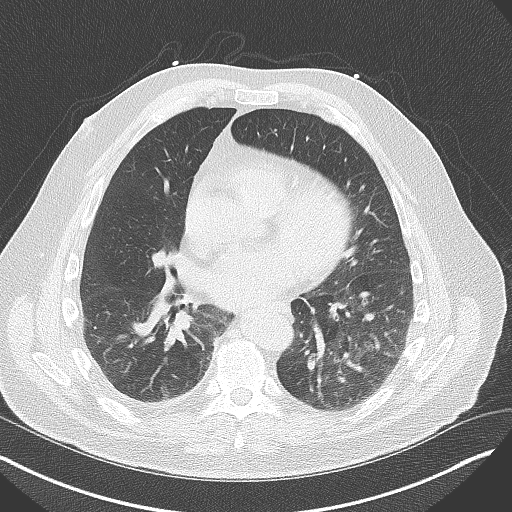
[im 43/52  lung]
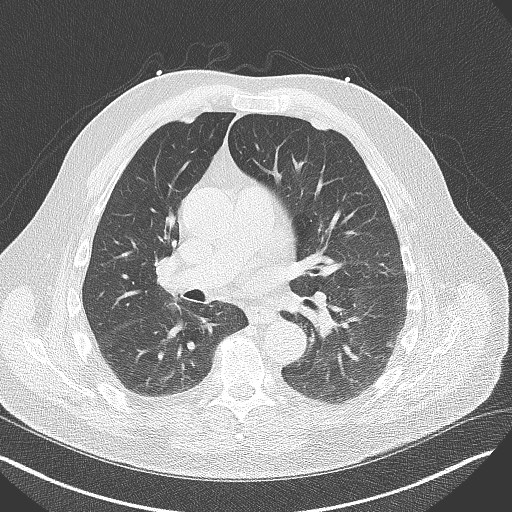

[Series 4: lung st 76 % · axial · 0.73mm/px · z∈[-248,-146]mm · 5 of 52 slices shown]
[im 9/52  lung]
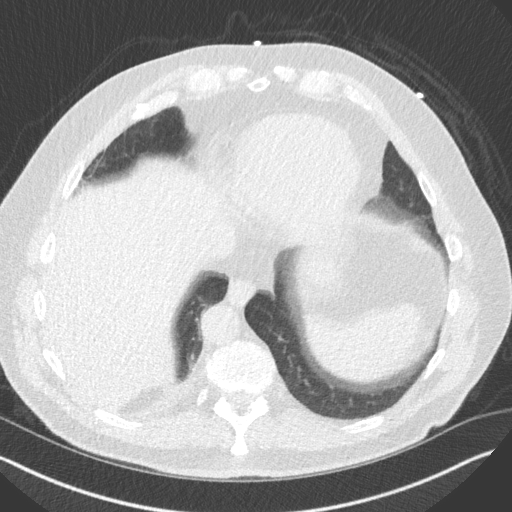
[im 18/52  lung]
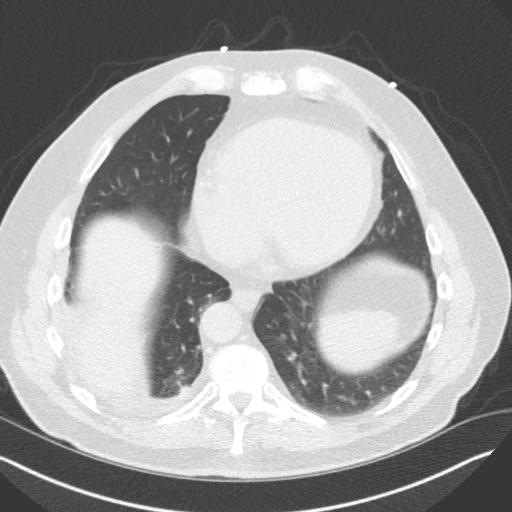
[im 26/52  lung]
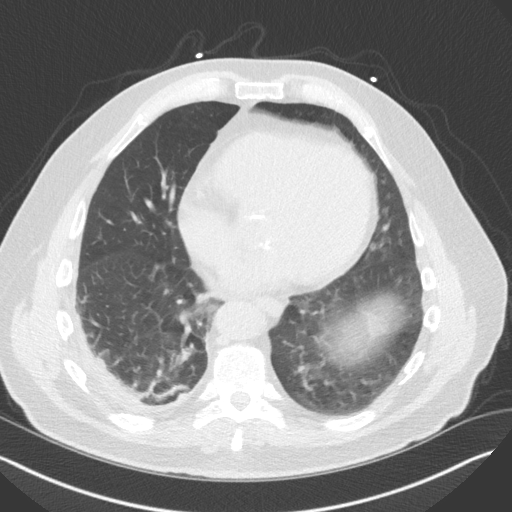
[im 35/52  lung]
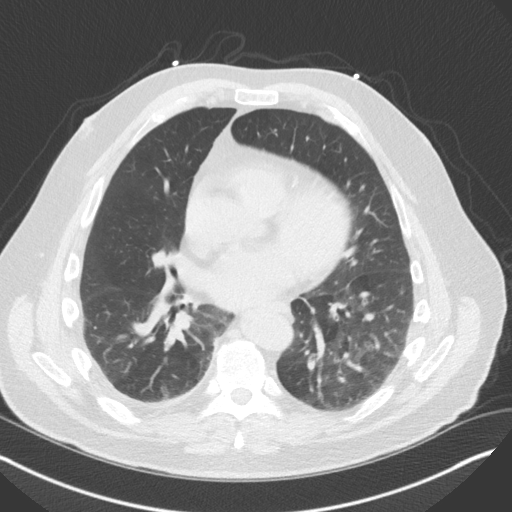
[im 43/52  lung]
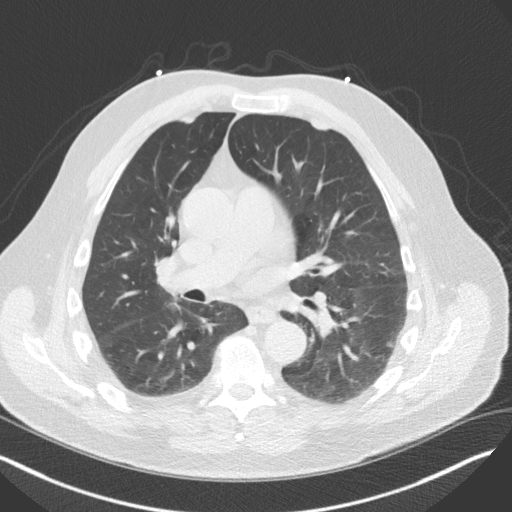

[14 of 20 positions shown; findings below may reference images not displayed]

FINDINGS: Several small 2-4 mm pulmonary nodules are again noted in the lungs
bilaterally, stable compared to prior examinations, considered
definitively benign. Trace right pleural effusion lying dependently
with some chronic pleuroparenchymal scarring in the base of the
right lower lobe. Within the visualized portions of the thorax there
are no other suspicious appearing pulmonary nodules or masses, there
is no acute consolidative airspace disease, no left pleural
effusion, no pneumothorax and no lymphadenopathy. Visualized
portions of the upper abdomen are unremarkable. There are no
aggressive appearing lytic or blastic lesions noted in the
visualized portions of the skeleton.
IMPRESSION: 1. Trace chronic right pleural effusion and mild scarring in the
base of the right lung.
2. Small pulmonary nodules, stable compared to prior examinations,
considered definitively benign.
FINDINGS: Coronary Calcium Score:

Left main: 0

Left anterior descending artery: 613

Left circumflex artery: 143

Right coronary artery: 67

Total: 823

Percentile: 83rd

Pericardium: Normal.

Ascending Aorta: Normal caliber.

Non-cardiac: See separate report from [REDACTED].
IMPRESSION: Coronary calcium score of 823. This was 83rd percentile for age-,
race-, and sex-matched controls.



If CAC=0, it is reasonable to withhold statin therapy and reassess
in 5 to 10 years, as long as higher risk conditions are absent
(diabetes mellitus, family history of premature CHD in first degree
relatives (males <55 years; females <65 years), cigarette smoking,
or LDL >=190 mg/dL).

If CAC is 1 to 99, it is reasonable to initiate statin therapy for
patients >=55 years of age.

If CAC is >=100 or >=75th percentile, it is reasonable to initiate
statin therapy at any age.

Cardiology referral should be considered for patients with CAC
scores >=400 or >=75th percentile.

*6988 AHA/ACC/AACVPR/AAPA/ABC/LARES/TENG/GEOVANY EFRAIN/Hui/LARHONDA/AUBREE/EMIRXAN
Guideline on the Management of Blood Cholesterol: A Report of the
American College of Cardiology/American Heart Association Task Force
on Clinical Practice Guidelines. J Am Coll Cardiol.
0530;73(24):7818-7886.

*** End of Addendum ***
EXAM:
OVER-READ INTERPRETATION  CT CHEST

The following report is an over-read performed by radiologist Dr.
Djawida Viki [REDACTED] on 03/05/2021. This
over-read does not include interpretation of cardiac or coronary
anatomy or pathology. The coronary calcium score interpretation by
the cardiologist is attached.
FINDINGS: Several small 2-4 mm pulmonary nodules are again noted in the lungs
bilaterally, stable compared to prior examinations, considered
definitively benign. Trace right pleural effusion lying dependently
with some chronic pleuroparenchymal scarring in the base of the
right lower lobe. Within the visualized portions of the thorax there
are no other suspicious appearing pulmonary nodules or masses, there
is no acute consolidative airspace disease, no left pleural
effusion, no pneumothorax and no lymphadenopathy. Visualized
portions of the upper abdomen are unremarkable. There are no
aggressive appearing lytic or blastic lesions noted in the
visualized portions of the skeleton.
IMPRESSION: 1. Trace chronic right pleural effusion and mild scarring in the
base of the right lung.
2. Small pulmonary nodules, stable compared to prior examinations,
considered definitively benign.

## 2023-02-13 DIAGNOSIS — J301 Allergic rhinitis due to pollen: Secondary | ICD-10-CM | POA: Diagnosis not present

## 2023-02-13 DIAGNOSIS — J3089 Other allergic rhinitis: Secondary | ICD-10-CM | POA: Diagnosis not present

## 2023-02-13 DIAGNOSIS — J3081 Allergic rhinitis due to animal (cat) (dog) hair and dander: Secondary | ICD-10-CM | POA: Diagnosis not present

## 2023-02-20 DIAGNOSIS — J3089 Other allergic rhinitis: Secondary | ICD-10-CM | POA: Diagnosis not present

## 2023-02-20 DIAGNOSIS — I10 Essential (primary) hypertension: Secondary | ICD-10-CM | POA: Diagnosis not present

## 2023-02-20 DIAGNOSIS — J3081 Allergic rhinitis due to animal (cat) (dog) hair and dander: Secondary | ICD-10-CM | POA: Diagnosis not present

## 2023-02-20 DIAGNOSIS — E1169 Type 2 diabetes mellitus with other specified complication: Secondary | ICD-10-CM | POA: Diagnosis not present

## 2023-02-20 DIAGNOSIS — Z6835 Body mass index (BMI) 35.0-35.9, adult: Secondary | ICD-10-CM | POA: Diagnosis not present

## 2023-02-20 DIAGNOSIS — J301 Allergic rhinitis due to pollen: Secondary | ICD-10-CM | POA: Diagnosis not present

## 2023-02-20 DIAGNOSIS — I700 Atherosclerosis of aorta: Secondary | ICD-10-CM | POA: Diagnosis not present

## 2023-02-20 DIAGNOSIS — E669 Obesity, unspecified: Secondary | ICD-10-CM | POA: Diagnosis not present

## 2023-02-21 ENCOUNTER — Telehealth: Payer: Self-pay | Admitting: Gastroenterology

## 2023-02-21 NOTE — Telephone Encounter (Signed)
Hi Dr. Meridee Score,   Supervising Provider: 02/21/23-PM  We received a referral for patient pt had recent EGD that showed gastric nodule and Barrett's esophagus; path benign. Was recommended for EUS. Records were obtained and scanned into Media for you to review and advise on scheduling.  Thanks

## 2023-02-22 NOTE — Telephone Encounter (Signed)
Even though there is mention of benign pathology I have no access to those records. The actual pathology report must be obtained so that I can review it. I am also not sure if this is a transfer of care versus a referral because you have noted at the top of this message as transfer of care but in the message it says EUS referral. What is actually happening with this patient? With reported benign pathology, he will be tentatively scheduled for a nonurgent EUS of the gastric nodule which I am scheduling into June at this point due to availability.  Shanda Bumps, please get the pathology report ASAP (this week faxed) and update Rovonda when it is available and clarify referral versus transfer of care. Rovonda, tentatively save an EUS slot for June and if we review pathology and something changes then we can go from there.

## 2023-02-22 NOTE — Telephone Encounter (Signed)
Good morning,  I do apologize I can see how that can be confusing, we received a referral from the patients PCP for EUS and the patient is requesting a transfer of care from [facility]. He stated he noticed they have a lot of turn around with providers and he has also seen Quentin Mulling PA-C in the past and is pleased with her bedside manner. The pathology report has been scanned into Media for you. FYI: there is also Prof[facility] notes in Media available.   Thank you please let me know if you need anything else.

## 2023-02-23 ENCOUNTER — Encounter: Payer: Self-pay | Admitting: Physician Assistant

## 2023-02-23 NOTE — Telephone Encounter (Signed)
Patient scheduled to see Quentin Mulling PA-C 04/27/23 at 11:30 am.   Shanda Bumps can you send Mr Probert the new patient paperwork via the mail?   Thank you,   Ro

## 2023-02-23 NOTE — Telephone Encounter (Signed)
Done

## 2023-02-23 NOTE — Telephone Encounter (Signed)
Thank you for this clarity. 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 If he is going to transition his care to [facility], I am okay for him to be excepted to the practice.  However we need his colonoscopy records to understand is a true full GI history, so please obtain those records for review as well.  At this point, the patient can be scheduled for a nonurgent endoscopic ultrasound in the next 2 to 3 months.  If he wants to be seen in clinic, then that can be scheduled with any of the APP's (okay with [facility] since he knows her previously) or myself (do not use a held or overbook slot).  Thanks. GM

## 2023-02-27 DIAGNOSIS — J3089 Other allergic rhinitis: Secondary | ICD-10-CM | POA: Diagnosis not present

## 2023-02-27 DIAGNOSIS — J3081 Allergic rhinitis due to animal (cat) (dog) hair and dander: Secondary | ICD-10-CM | POA: Diagnosis not present

## 2023-02-27 DIAGNOSIS — J301 Allergic rhinitis due to pollen: Secondary | ICD-10-CM | POA: Diagnosis not present

## 2023-03-06 ENCOUNTER — Ambulatory Visit
Admission: RE | Admit: 2023-03-06 | Discharge: 2023-03-06 | Disposition: A | Discharge status: Home / Self Care | Payer: Medicare PPO | Source: Ambulatory Visit | Attending: Internal Medicine | Admitting: Internal Medicine

## 2023-03-06 ENCOUNTER — Other Ambulatory Visit: Payer: Self-pay | Admitting: Internal Medicine

## 2023-03-06 DIAGNOSIS — J40 Bronchitis, not specified as acute or chronic: Secondary | ICD-10-CM

## 2023-03-06 DIAGNOSIS — Z03818 Encounter for observation for suspected exposure to other biological agents ruled out: Secondary | ICD-10-CM | POA: Diagnosis not present

## 2023-03-06 DIAGNOSIS — R053 Chronic cough: Secondary | ICD-10-CM | POA: Diagnosis not present

## 2023-03-06 DIAGNOSIS — J219 Acute bronchiolitis, unspecified: Secondary | ICD-10-CM | POA: Diagnosis not present

## 2023-03-06 DIAGNOSIS — J3089 Other allergic rhinitis: Secondary | ICD-10-CM | POA: Diagnosis not present

## 2023-03-06 DIAGNOSIS — N5201 Erectile dysfunction due to arterial insufficiency: Secondary | ICD-10-CM | POA: Diagnosis not present

## 2023-03-06 DIAGNOSIS — I25119 Atherosclerotic heart disease of native coronary artery with unspecified angina pectoris: Secondary | ICD-10-CM | POA: Diagnosis not present

## 2023-03-06 DIAGNOSIS — R058 Other specified cough: Secondary | ICD-10-CM | POA: Diagnosis not present

## 2023-03-06 DIAGNOSIS — J301 Allergic rhinitis due to pollen: Secondary | ICD-10-CM | POA: Diagnosis not present

## 2023-03-06 DIAGNOSIS — R351 Nocturia: Secondary | ICD-10-CM | POA: Diagnosis not present

## 2023-03-06 DIAGNOSIS — J3081 Allergic rhinitis due to animal (cat) (dog) hair and dander: Secondary | ICD-10-CM | POA: Diagnosis not present

## 2023-03-06 DIAGNOSIS — J90 Pleural effusion, not elsewhere classified: Secondary | ICD-10-CM | POA: Diagnosis not present

## 2023-03-08 DIAGNOSIS — J301 Allergic rhinitis due to pollen: Secondary | ICD-10-CM | POA: Diagnosis not present

## 2023-03-08 DIAGNOSIS — R052 Subacute cough: Secondary | ICD-10-CM | POA: Diagnosis not present

## 2023-03-13 DIAGNOSIS — J301 Allergic rhinitis due to pollen: Secondary | ICD-10-CM | POA: Diagnosis not present

## 2023-03-13 DIAGNOSIS — J3081 Allergic rhinitis due to animal (cat) (dog) hair and dander: Secondary | ICD-10-CM | POA: Diagnosis not present

## 2023-03-13 DIAGNOSIS — J3089 Other allergic rhinitis: Secondary | ICD-10-CM | POA: Diagnosis not present

## 2023-03-15 DIAGNOSIS — E1169 Type 2 diabetes mellitus with other specified complication: Secondary | ICD-10-CM | POA: Diagnosis not present

## 2023-03-15 DIAGNOSIS — I2510 Atherosclerotic heart disease of native coronary artery without angina pectoris: Secondary | ICD-10-CM | POA: Diagnosis not present

## 2023-03-15 DIAGNOSIS — I10 Essential (primary) hypertension: Secondary | ICD-10-CM | POA: Diagnosis not present

## 2023-03-15 DIAGNOSIS — K219 Gastro-esophageal reflux disease without esophagitis: Secondary | ICD-10-CM | POA: Diagnosis not present

## 2023-03-15 DIAGNOSIS — E119 Type 2 diabetes mellitus without complications: Secondary | ICD-10-CM | POA: Diagnosis not present

## 2023-03-15 DIAGNOSIS — K3189 Other diseases of stomach and duodenum: Secondary | ICD-10-CM | POA: Diagnosis not present

## 2023-03-15 DIAGNOSIS — Z9989 Dependence on other enabling machines and devices: Secondary | ICD-10-CM | POA: Diagnosis not present

## 2023-03-15 DIAGNOSIS — R053 Chronic cough: Secondary | ICD-10-CM | POA: Diagnosis not present

## 2023-03-15 DIAGNOSIS — I700 Atherosclerosis of aorta: Secondary | ICD-10-CM | POA: Diagnosis not present

## 2023-03-15 DIAGNOSIS — G4733 Obstructive sleep apnea (adult) (pediatric): Secondary | ICD-10-CM | POA: Diagnosis not present

## 2023-03-20 DIAGNOSIS — J3089 Other allergic rhinitis: Secondary | ICD-10-CM | POA: Diagnosis not present

## 2023-03-20 DIAGNOSIS — J301 Allergic rhinitis due to pollen: Secondary | ICD-10-CM | POA: Diagnosis not present

## 2023-03-20 DIAGNOSIS — J3081 Allergic rhinitis due to animal (cat) (dog) hair and dander: Secondary | ICD-10-CM | POA: Diagnosis not present

## 2023-03-24 DIAGNOSIS — J301 Allergic rhinitis due to pollen: Secondary | ICD-10-CM | POA: Diagnosis not present

## 2023-03-27 DIAGNOSIS — J3081 Allergic rhinitis due to animal (cat) (dog) hair and dander: Secondary | ICD-10-CM | POA: Diagnosis not present

## 2023-03-27 DIAGNOSIS — J3089 Other allergic rhinitis: Secondary | ICD-10-CM | POA: Diagnosis not present

## 2023-03-27 DIAGNOSIS — J301 Allergic rhinitis due to pollen: Secondary | ICD-10-CM | POA: Diagnosis not present

## 2023-03-30 DIAGNOSIS — B351 Tinea unguium: Secondary | ICD-10-CM | POA: Diagnosis not present

## 2023-03-30 DIAGNOSIS — I70203 Unspecified atherosclerosis of native arteries of extremities, bilateral legs: Secondary | ICD-10-CM | POA: Diagnosis not present

## 2023-03-30 DIAGNOSIS — L603 Nail dystrophy: Secondary | ICD-10-CM | POA: Diagnosis not present

## 2023-03-30 DIAGNOSIS — E1142 Type 2 diabetes mellitus with diabetic polyneuropathy: Secondary | ICD-10-CM | POA: Diagnosis not present

## 2023-03-30 DIAGNOSIS — M792 Neuralgia and neuritis, unspecified: Secondary | ICD-10-CM | POA: Diagnosis not present

## 2023-03-30 DIAGNOSIS — E1151 Type 2 diabetes mellitus with diabetic peripheral angiopathy without gangrene: Secondary | ICD-10-CM | POA: Diagnosis not present

## 2023-03-30 DIAGNOSIS — L851 Acquired keratosis [keratoderma] palmaris et plantaris: Secondary | ICD-10-CM | POA: Diagnosis not present

## 2023-04-04 DIAGNOSIS — J3089 Other allergic rhinitis: Secondary | ICD-10-CM | POA: Diagnosis not present

## 2023-04-04 DIAGNOSIS — J301 Allergic rhinitis due to pollen: Secondary | ICD-10-CM | POA: Diagnosis not present

## 2023-04-04 DIAGNOSIS — J3081 Allergic rhinitis due to animal (cat) (dog) hair and dander: Secondary | ICD-10-CM | POA: Diagnosis not present

## 2023-04-07 DIAGNOSIS — G4733 Obstructive sleep apnea (adult) (pediatric): Secondary | ICD-10-CM | POA: Diagnosis not present

## 2023-04-12 DIAGNOSIS — J3081 Allergic rhinitis due to animal (cat) (dog) hair and dander: Secondary | ICD-10-CM | POA: Diagnosis not present

## 2023-04-12 DIAGNOSIS — J301 Allergic rhinitis due to pollen: Secondary | ICD-10-CM | POA: Diagnosis not present

## 2023-04-12 DIAGNOSIS — J3089 Other allergic rhinitis: Secondary | ICD-10-CM | POA: Diagnosis not present

## 2023-04-17 DIAGNOSIS — J301 Allergic rhinitis due to pollen: Secondary | ICD-10-CM | POA: Diagnosis not present

## 2023-04-17 DIAGNOSIS — J3081 Allergic rhinitis due to animal (cat) (dog) hair and dander: Secondary | ICD-10-CM | POA: Diagnosis not present

## 2023-04-17 DIAGNOSIS — J3089 Other allergic rhinitis: Secondary | ICD-10-CM | POA: Diagnosis not present

## 2023-04-21 ENCOUNTER — Ambulatory Visit: Payer: Medicare PPO | Admitting: Physician Assistant

## 2023-04-24 DIAGNOSIS — J301 Allergic rhinitis due to pollen: Secondary | ICD-10-CM | POA: Diagnosis not present

## 2023-04-24 DIAGNOSIS — J3089 Other allergic rhinitis: Secondary | ICD-10-CM | POA: Diagnosis not present

## 2023-04-24 DIAGNOSIS — J3081 Allergic rhinitis due to animal (cat) (dog) hair and dander: Secondary | ICD-10-CM | POA: Diagnosis not present

## 2023-04-26 NOTE — Progress Notes (Unsigned)
04/27/2023 DONAHUE MONDOR [id number] 06-04-1951  Referring provider: Kirby Funk, MD Primary GI doctor: Dr. Meridee Score  ASSESSMENT AND PLAN:   Gastric submucosal nodule 01/13/2023 EGD at [facility] 15 mm submucosal papule prepyloric region of the stomach, diffuse mild inflammation gastric body normal fundus and cardia, normal duodenum.   Will set up for EUS with Dr. Meridee Score for further evaluation The risks of an EUS including intestinal perforation, bleeding, infection, aspiration, and medication effects were discussed as was the possibility it may not give a definitive diagnosis if a biopsy is performed.  When a biopsy of the pancreas is done as part of the EUS, there is an additional risk of pancreatitis at the rate of about 1-2%.  It was explained that procedure related pancreatitis is typically mild, although it can be severe and even life threatening, which is why we do not perform random pancreatic biopsies and only biopsy a lesion/area we feel is concerning enough to warrant the risk.  Barrett's esophagus without dysplasia with GERD and chronic cough Family history of mother with esophageal erosion requiring repair, younger brother with barrett's.  Short segment barrett's mucosa negative for dysplasia other 1 fragment of tissue with intestinal metaplasia.  No significnant GERd symptoms but does have chronic cough Patient has been on protonix 40 mg BID without help Information about GERD given to patient, weight loss discussed Avoid NSAIDS, stop mobic Mounjaro may be worsening GERD, had cough prior to med, may need to consider stopping, can talk with PCP With this history, may need to consider switching to dexilant or voquezna if insurance covers this versus pH study to evaluate for reflux while on PPI to consider TIF Will discuss with Dr. Meridee Score Recall EGD 3-5 years, likely 3 with history, needs recall placed.   Diverticulosis of colon without hemorrhage Will call if any  symptoms. Add on fiber supplement, avoid NSAIDS, information given  Due for recall colon 08/2023 Recall placed in computer  Patient Care Team: Kirby Funk, MD (Inactive) as PCP - General (Internal Medicine)  HISTORY OF PRESENT ILLNESS: 72 y.o. male with a past medical history of hypertension, type 2 diabetes, hyperlipidemia, CAD LHC 03/2021 25% proximal LAD, LVEDP normal, history of l right empyema status postthoracotomy, OSA on CPAP and others listed below presents for evaluation of history of gastric nodules, Barrett's esophagus.   01/16/2023 office visit with Dr. Wandra Mannan, no chest pain, no shortness of breath, was complaining of dizziness. 01/09/2023 office visit with Cira Servant at [place] for chronic cough and chronic reflux, patient was already on Protonix 40 mg twice daily famotidine daily, added on Carafate.    01/13/2023 EGD for chronic cough with Dr. Levora Angel at [facility] showed esophageal mucosal changes consistent with short segment Barrett's esophagus 15 mm submucosal papule prepyloric region of the stomach, diffuse mild inflammation gastric body normal fundus and cardia, normal duodenum.   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 pylori, moderate chronic inactive gastritis, Barrett's mucosa negative for dysplasia other 1 fragment of tissue with intestinal metaplasia.  08/13/2013 colonoscopy with Dr. Laural Benes showed good preparation, completely unremarkable colonoscopy other than diverticulosis recall 10 years (08/2023) Brother with history of Barrett's esophagus, father with history of diverticulosis status post partial colectomy, father with history of pancreatic cancer.  He rarely has GERD, he continues to have coughing, has had for years.  Has seen ENT, pulmonary, on nasal spray without help.  He does wear CPAP at night, he has moveable bed.  He is on monjaro but had cough before that, takes mobic rarely.  Denies dysphagia,  melena, no weight loss.  Mom with esophageal erosion causing recurrent pneumonia. Younger brother with barret's .  He denies blood thinner use.  He reports rare NSAID use for aleve, mobic rare.  He denies ETOH use, or very rare.   He denies tobacco use, quit 35-40 years ago but had 22 pack year smoking history  He denies drug use.    He  reports that he has quit smoking. His smoking use included cigarettes. He has a 22.50 pack-year smoking history. He has been exposed to tobacco smoke. He has never used smokeless tobacco. He reports current alcohol use. He reports that he does not use drugs.  RELEVANT LABS AND IMAGING: CBC    Component Value Date/Time   WBC 9.8 03/09/2021 0906   WBC 15.3 (H) 12/31/2019 1230   RBC 5.00 03/09/2021 0906   RBC 5.11 12/31/2019 1230   HGB 14.3 04/06/2021 0743   HGB 15.2 03/09/2021 0906   HCT 42.0 04/06/2021 0743   HCT 45.7 03/09/2021 0906   PLT 226 03/09/2021 0906   MCV 91 03/09/2021 0906   MCH 30.4 03/09/2021 0906   MCH 28.3 04/05/2019 0426   MCHC 33.3 03/09/2021 0906   MCHC 33.0 12/31/2019 1230   RDW 11.9 03/09/2021 0906   LYMPHSABS 3.0 12/31/2019 1230   MONOABS 1.2 (H) 12/31/2019 1230   EOSABS 0.5 12/31/2019 1230   BASOSABS 0.1 12/31/2019 1230   No results for input(s): "HGB" in the last 8760 hours.  CMP     Component Value Date/Time   NA 141 09/09/2021 0825   K 4.3 09/09/2021 0825   CL 107 (H) 09/09/2021 0825   CO2 19 (L) 09/09/2021 0825   GLUCOSE 104 (H) 09/09/2021 0825   GLUCOSE 106 (H) 04/06/2021 0743   BUN 14 09/09/2021 0825   CREATININE 0.87 09/09/2021 0825   CALCIUM 9.4 09/09/2021 0825   PROT 6.7 09/09/2021 0825   ALBUMIN 4.8 09/09/2021 0825   AST 15 09/09/2021 0825   ALT 15 09/09/2021 0825   ALKPHOS 91 09/09/2021 0825   BILITOT 0.5 09/09/2021 0825   GFRNONAA 85 12/24/2020 0822   GFRAA 98 12/24/2020 0822      Latest Ref Rng & Units 09/09/2021    8:25 AM 12/24/2020    8:22 AM 07/20/2020    8:56 AM  Hepatic Function   Total Protein 6.0 - 8.5 g/dL 6.7  6.8  6.9   Albumin 3.8 - 4.8 g/dL 4.8  4.9  4.7   AST 0 - 40 IU/L 15  22  17    ALT 0 - 44 IU/L 15  22  18    Alk Phosphatase 44 - 121 IU/L 91  90  63   Total Bilirubin 0.0 - 1.2 mg/dL 0.5  0.4  0.4       Current Medications:   Current Outpatient Medications (Endocrine & Metabolic):    metFORMIN (GLUCOPHAGE-XR) 500 MG 24 hr tablet, Take 2 tablets (1,000 mg total) by mouth 2 (two) times daily.   MOUNJARO 2.5 MG/0.5ML Pen, Inject 2.5 mg into the skin once a week.  Current Outpatient Medications (Cardiovascular):    amLODipine-valsartan (EXFORGE) 5-160 MG tablet, Take 1 tablet by mouth daily.   atorvastatin (LIPITOR) 20 MG tablet, Take 20 mg by mouth every evening.   EPINEPHrine 0.3 mg/0.3 mL IJ SOAJ injection, Inject 0.3 mg into the muscle as needed for anaphylaxis.   tadalafil (CIALIS) 5 MG  tablet, Take 5 mg by mouth daily as needed.  Current Outpatient Medications (Respiratory):    albuterol (VENTOLIN HFA) 108 (90 Base) MCG/ACT inhaler, Inhale 1-2 puffs into the lungs every 4 (four) hours as needed for shortness of breath.   azelastine (ASTELIN) 0.1 % nasal spray, Place 1 spray into both nostrils 2 (two) times daily as needed for rhinitis. Use in each nostril as directed   chlorpheniramine (CHLOR-TRIMETON) 4 MG tablet, Take 4 mg by mouth 3 (three) times daily as needed for allergies.   fluticasone (FLONASE) 50 MCG/ACT nasal spray, Place 2 sprays into both nostrils daily as needed for allergies.   levocetirizine (XYZAL) 5 MG tablet, Take 5 mg by mouth every evening.   montelukast (SINGULAIR) 10 MG tablet, Take 10 mg by mouth at bedtime.  Current Outpatient Medications (Analgesics):    aspirin EC 81 MG tablet, Take 81 mg by mouth daily.   meloxicam (MOBIC) 15 MG tablet, Take 15 mg by mouth daily as needed.   Current Outpatient Medications (Other):    ALPRAZolam (XANAX) 0.25 MG tablet, Take 1 tablet (0.25 mg total) by mouth 2 (two) times daily as  needed for anxiety.   famotidine (PEPCID) 20 MG tablet, Take 20 mg by mouth 2 (two) times daily.   finasteride (PROSCAR) 5 MG tablet, Take 5 mg by mouth daily.   glucose blood test strip, 1 each by Other route as needed for other. Use as instructed   mirabegron ER (MYRBETRIQ) 50 MG TB24 tablet, Take 50 mg by mouth daily.   Multiple Vitamin (MULTIVITAMIN WITH MINERALS) TABS tablet, Take 1 tablet by mouth daily.   ondansetron (ZOFRAN) 4 MG tablet, Take 4 mg by mouth every 8 (eight) hours as needed for nausea or vomiting.   pantoprazole (PROTONIX) 40 MG tablet, Take 40 mg by mouth 2 (two) times daily.    sucralfate (CARAFATE) 1 g tablet, Take 1 g by mouth 2 (two) times daily.   Vitamin D, Ergocalciferol, (DRISDOL) 1.25 MG (50000 UNIT) CAPS capsule, TAKE 1 CAPSULE BY MOUTH EVERY 14 DAYS   zolpidem (AMBIEN) 10 MG tablet, Take 10 mg by mouth at bedtime.   Medical History:  Past Medical History:  Diagnosis Date   Allergies    Anxiety    CPAP (continuous positive airway pressure) dependence    Diabetes mellitus without complication (HCC)    Edema of both lower extremities    Empyema (HCC)    GERD (gastroesophageal reflux disease)    Hepatitis 1980'S   HEPATITIS B   High cholesterol    Hypertension    Joint pain    Pneumonia    Sleep apnea    SOB (shortness of breath)    Allergies: No Known Allergies   Surgical History:  He  has a past surgical history that includes Hernia repair (28 WEEKS OLD); Appendectomy (AGE 71 OR 13); Colonoscopy with propofol (N/A, 08/13/2013); IR THORACENTESIS ASP PLEURAL SPACE W/IMG GUIDE (03/07/2019); Eye surgery (Left); Thoracotomy (Right, 04/03/2019); Empyema drainage (Right, 04/03/2019); Decortication (Right, 04/03/2019); Pain pump implantation (04/03/2019); and LEFT HEART CATH AND CORONARY ANGIOGRAPHY (N/A, 04/06/2021). Family History:  His family history includes Anxiety disorder in his mother; Cancer in his father; Diabetes in his father; Heart disease in his  mother; High Cholesterol in his father and mother; High blood pressure in his mother; Obesity in his mother; Seizures in his brother.  REVIEW OF SYSTEMS  : All other systems reviewed and negative except where noted in the History of Present Illness.  PHYSICAL  EXAM: BP 136/82   Pulse 80   Ht 5\' 10"  (1.778 m)   Wt 239 lb 6.4 oz (108.6 kg)   SpO2 97%   BMI 34.35 kg/m  General Appearance: Well nourished, in no apparent distress. Head:   Normocephalic and atraumatic. Eyes:  sclerae anicteric,conjunctive pink  Respiratory: Respiratory effort normal, BS equal bilaterally without rales, rhonchi, wheezing. Cardio: RRR with no MRGs. Peripheral pulses intact.  Abdomen: Soft,  Obese ,active bowel sounds. No tenderness . Without guarding and Without rebound. No masses. Rectal: Not evaluated Musculoskeletal: Full ROM, Normal gait. Without edema. Skin:  Dry and intact without significant lesions or rashes Neuro: Alert and  oriented x4;  No focal deficits. Psych:  Cooperative. Normal mood and affect.    Doree Albee, PA-C 11:49 AM

## 2023-04-27 ENCOUNTER — Other Ambulatory Visit: Payer: Self-pay

## 2023-04-27 ENCOUNTER — Telehealth: Payer: Self-pay

## 2023-04-27 ENCOUNTER — Ambulatory Visit: Payer: Medicare PPO | Admitting: Physician Assistant

## 2023-04-27 ENCOUNTER — Encounter: Payer: Self-pay | Admitting: Physician Assistant

## 2023-04-27 VITALS — BP 136/82 | HR 80 | Ht 70.0 in | Wt 239.4 lb

## 2023-04-27 DIAGNOSIS — G4733 Obstructive sleep apnea (adult) (pediatric): Secondary | ICD-10-CM | POA: Diagnosis not present

## 2023-04-27 DIAGNOSIS — R053 Chronic cough: Secondary | ICD-10-CM | POA: Diagnosis not present

## 2023-04-27 DIAGNOSIS — K2270 Barrett's esophagus without dysplasia: Secondary | ICD-10-CM

## 2023-04-27 DIAGNOSIS — K3189 Other diseases of stomach and duodenum: Secondary | ICD-10-CM | POA: Insufficient documentation

## 2023-04-27 DIAGNOSIS — K5730 Diverticulosis of large intestine without perforation or abscess without bleeding: Secondary | ICD-10-CM | POA: Diagnosis not present

## 2023-04-27 DIAGNOSIS — K219 Gastro-esophageal reflux disease without esophagitis: Secondary | ICD-10-CM | POA: Insufficient documentation

## 2023-04-27 NOTE — Progress Notes (Signed)
Attending Physician's Attestation   I have reviewed the chart.   I agree with the Advanced Practitioner's note, impression, and recommendations with any updates as below. Not clear that we would get approval for Voquenza, unless patient has erosive esophagitis.  Could trial another PPI (Dexilant or Aciphex) to see if any difference in regards to cough symptom, otherwise, agree with pH impedence testing to help distinguish and rule out GERD as reason for his chronic cough, could be considered.  EUS reasonable for evaluation of the gastric SEL.   Corliss Parish, MD [facility] Advanced Endoscopy Office # [phone]

## 2023-04-27 NOTE — Patient Instructions (Addendum)
We will contact you with your appointment for your procedure for your EUS with Dr Meridee Score  Generally a cough is either coming from above or from below- so we will treat this OR it can be from irritation/viral cough  To treat the nasal drip: Get on the chlorphenirmine every 6 hours- This medication can make you sleepy but helps with nasal drip- get from over the counter.   Can do a steroid nasal spary 1-2 sparys at night each nostril. Remember to spray each nostril twice towards the outer part of your eye.  Do not sniff but instead pinch your nose and tilt your head back to help the medicine get into your sinuses.  The best time to do this is at bedtime. Stop if you get blurred vision or nose bleeds.   Continue treatment for reflux  To stop irritation: Need to STOP the cough Do sugar free candy Can refer to voice therapy if everything is unremarkable  Go to the ER or call the office if you get any chest pain, shortness of breath, severe  headache, leg swelling.    Common causes of cough OR hoarseness OR sore throat:   Allergies, Viral Infections, Acid Reflux and Bacterial Infections.    Allergies and viral infections cause a cough OR sore throat by post nasal drip and are often worse at night, can also have sneezing, lower grade fevers, clear/yellow mucus. This is best treated with allergy medications or nasal sprays.  Please get on allegra for 1-2 weeks The strongest is allegra or fexafinadine  Cheapest at [employer], [employer], [employer]   Bacterial infections are more severe than allergies or viral infections with fever, teeth pain, fatigue. This can be treated with prednisone and the same over the counter medication and after 7 days can be treated with an antibiotic.   Silent reflux/GERD can cause a cough OR sore throat OR hoarseness WITHOUT heart burn because the esophagus that goes to the stomach and trachea that goes to the lungs are very close and when you lay down the acid can irritate  your throat and lungs. This can cause hoarseness, cough, and wheezing. Please stop any alcohol or anti-inflammatories like aleve/advil/ibuprofen and start an over the counter Prilosec or omeprazole 1-2 times daily before food for 2 weeks, then switch to over the counter zantac/ratinidine or pepcid/famotadine once at night for 2 weeks.    sometimes irritation causes more irritation. Try voice rest, use sugar free cough drops to prevent coughing, and try to stop clearing your throat.   If you ever have a cough that does not go away after trying these things please make a follow up visit for further evaluation or we can refer you to a specialist. Or if you ever have shortness of breath or chest pain go to the ER.    Silent reflux: Not all heartburn burns...Marland KitchenMarland KitchenMarland Kitchen  What is LPR? Laryngopharyngeal reflux (LPR) or silent reflux is a condition in which acid that is made in the stomach travels up the esophagus (swallowing tube) and gets to the throat. Not everyone with reflux has a lot of heartburn or indigestion. In fact, many people with LPR never have heartburn. This is why LPR is called SILENT REFLUX, and the terms "Silent reflux" and "LPR" are often used interchangeably. Because LPR is silent, it is sometimes difficult to diagnose.  How can you tell if you have LPR?  Chronic hoarseness- Some people have hoarseness that comes and goes throat clearing  Cough It can  cause shortness of breath and cause asthma like symptoms. a feeling of a lump in the throat  difficulty swallowing a problem with too much nose and throat drainage.  Some people will feel their esophagus spasm which feels like their heart beating hard and fast, this will usually be after a meal, at rest, or lying down at night.    How do I treat this? Treatment for LPR should be individualized, and your doctor will suggest the best treatment for you. Generally there are several treatments for LPR: changing habits and diet to reduce  reflux,  medications to reduce stomach acid, and  surgery to prevent reflux. Most people with LPR need to modify how and when they eat, as well as take some medication, to get well. Sometimes, nonprescription liquid antacids, such as Maalox, Gelucil and Mylanta are recommended. When used, these antacids should be taken four times each day - one tablespoon one hour after each meal and before bedtime. Dietary and lifestyle changes alone are not often enough to control LPR - medications that reduce stomach acid are also usually needed. These must be prescribed by our doctor.   TIPS FOR REDUCING REFLUX AND LPR Control your LIFE-STYLE and your DIET! If you use tobacco, QUIT.  Smoking makes you reflux. After every cigarette you have some LPR.  Don't wear clothing that is too tight, especially around the waist (trousers, corsets, belts).  Do not lie down just after eating...in fact, do not eat within three hours of bedtime.  You should be on a low-fat diet.  Limit your intake of red meat.  Limit your intake of butter.  Avoid fried foods.  Avoid chocolate  Avoid cheese.  Avoid eggs. Specifically avoid caffeine (especially coffee and tea), soda pop (especially cola) and mints.  Avoid alcoholic beverages, particularly in the evening.  For more information on the TIF procedure please visit the website at [email or web address]://www.[email or web address]/. If you would like to view an informative video regarding the TIF procedure, please visit:  [email or web address].

## 2023-04-27 NOTE — Telephone Encounter (Signed)
Appt for EUS has been scheduled for 07/06/23 at 115 pm with GM at [facility].  I have called and spoken to the pt and gave him all information.  I have also mailed all information to the pt.

## 2023-04-27 NOTE — Addendum Note (Signed)
Addended by: Marlowe Kays on: 04/27/2023 01:32 PM   Modules accepted: Orders

## 2023-05-01 DIAGNOSIS — J301 Allergic rhinitis due to pollen: Secondary | ICD-10-CM | POA: Diagnosis not present

## 2023-05-01 DIAGNOSIS — J3081 Allergic rhinitis due to animal (cat) (dog) hair and dander: Secondary | ICD-10-CM | POA: Diagnosis not present

## 2023-05-01 DIAGNOSIS — J3089 Other allergic rhinitis: Secondary | ICD-10-CM | POA: Diagnosis not present

## 2023-05-08 DIAGNOSIS — J301 Allergic rhinitis due to pollen: Secondary | ICD-10-CM | POA: Diagnosis not present

## 2023-05-08 DIAGNOSIS — J3089 Other allergic rhinitis: Secondary | ICD-10-CM | POA: Diagnosis not present

## 2023-05-08 DIAGNOSIS — J3081 Allergic rhinitis due to animal (cat) (dog) hair and dander: Secondary | ICD-10-CM | POA: Diagnosis not present

## 2023-05-15 DIAGNOSIS — J301 Allergic rhinitis due to pollen: Secondary | ICD-10-CM | POA: Diagnosis not present

## 2023-05-15 DIAGNOSIS — J3089 Other allergic rhinitis: Secondary | ICD-10-CM | POA: Diagnosis not present

## 2023-05-15 DIAGNOSIS — H40013 Open angle with borderline findings, low risk, bilateral: Secondary | ICD-10-CM | POA: Diagnosis not present

## 2023-05-15 DIAGNOSIS — J3081 Allergic rhinitis due to animal (cat) (dog) hair and dander: Secondary | ICD-10-CM | POA: Diagnosis not present

## 2023-05-15 DIAGNOSIS — H40053 Ocular hypertension, bilateral: Secondary | ICD-10-CM | POA: Diagnosis not present

## 2023-05-31 DIAGNOSIS — J3089 Other allergic rhinitis: Secondary | ICD-10-CM | POA: Diagnosis not present

## 2023-05-31 DIAGNOSIS — J3081 Allergic rhinitis due to animal (cat) (dog) hair and dander: Secondary | ICD-10-CM | POA: Diagnosis not present

## 2023-05-31 DIAGNOSIS — J301 Allergic rhinitis due to pollen: Secondary | ICD-10-CM | POA: Diagnosis not present

## 2023-06-02 DIAGNOSIS — B349 Viral infection, unspecified: Secondary | ICD-10-CM | POA: Diagnosis not present

## 2023-06-02 DIAGNOSIS — J8410 Pulmonary fibrosis, unspecified: Secondary | ICD-10-CM | POA: Diagnosis not present

## 2023-06-05 DIAGNOSIS — J3081 Allergic rhinitis due to animal (cat) (dog) hair and dander: Secondary | ICD-10-CM | POA: Diagnosis not present

## 2023-06-05 DIAGNOSIS — J3089 Other allergic rhinitis: Secondary | ICD-10-CM | POA: Diagnosis not present

## 2023-06-05 DIAGNOSIS — J301 Allergic rhinitis due to pollen: Secondary | ICD-10-CM | POA: Diagnosis not present

## 2023-06-12 DIAGNOSIS — J3089 Other allergic rhinitis: Secondary | ICD-10-CM | POA: Diagnosis not present

## 2023-06-12 DIAGNOSIS — J301 Allergic rhinitis due to pollen: Secondary | ICD-10-CM | POA: Diagnosis not present

## 2023-06-12 DIAGNOSIS — J3081 Allergic rhinitis due to animal (cat) (dog) hair and dander: Secondary | ICD-10-CM | POA: Diagnosis not present

## 2023-06-19 DIAGNOSIS — J301 Allergic rhinitis due to pollen: Secondary | ICD-10-CM | POA: Diagnosis not present

## 2023-06-19 DIAGNOSIS — J3081 Allergic rhinitis due to animal (cat) (dog) hair and dander: Secondary | ICD-10-CM | POA: Diagnosis not present

## 2023-06-19 DIAGNOSIS — J3089 Other allergic rhinitis: Secondary | ICD-10-CM | POA: Diagnosis not present

## 2023-06-28 DIAGNOSIS — J301 Allergic rhinitis due to pollen: Secondary | ICD-10-CM | POA: Diagnosis not present

## 2023-07-06 ENCOUNTER — Encounter (HOSPITAL_COMMUNITY): Payer: Self-pay

## 2023-07-06 ENCOUNTER — Ambulatory Visit (HOSPITAL_COMMUNITY): Admit: 2023-07-06 | Payer: Medicare PPO | Admitting: Gastroenterology

## 2023-07-06 SURGERY — UPPER ENDOSCOPIC ULTRASOUND (EUS) RADIAL
Anesthesia: Monitor Anesthesia Care

## 2023-07-14 DIAGNOSIS — J301 Allergic rhinitis due to pollen: Secondary | ICD-10-CM | POA: Diagnosis not present

## 2023-07-18 DIAGNOSIS — J301 Allergic rhinitis due to pollen: Secondary | ICD-10-CM | POA: Diagnosis not present

## 2023-07-24 DIAGNOSIS — J301 Allergic rhinitis due to pollen: Secondary | ICD-10-CM | POA: Diagnosis not present

## 2023-07-24 DIAGNOSIS — J3089 Other allergic rhinitis: Secondary | ICD-10-CM | POA: Diagnosis not present

## 2023-07-24 DIAGNOSIS — J3081 Allergic rhinitis due to animal (cat) (dog) hair and dander: Secondary | ICD-10-CM | POA: Diagnosis not present

## 2023-07-24 NOTE — Progress Notes (Signed)
Attempted to obtain medical history via telephone, unable to reach at this time. HIPAA compliant voicemail message left requesting return call to pre surgical testing department.

## 2023-07-24 NOTE — Progress Notes (Signed)
COVID Vaccine Completed:  Yes  Date of COVID positive in last 90 days:  PCP - Kirby Funk, MD Cardiologist -  Lennie Odor, MD Pulmonologist - Chilton Greathouse, MD  Chest x-ray - 03-06-23 Epic EKG - 01-16-23 Epic Stress Test - 03-04-21 Epic ECHO - 03-05-21 Epic Cardiac Cath - 04-06-21 Epic Pacemaker/ICD device last checked: Spinal Cord Stimulator: Cardiac CT - 03-05-21 Epic  Bowel Prep -   Sleep Study -  Yes, +sleep apnea CPAP -   Fasting Blood Sugar -  Checks Blood Sugar _____ times a day  Last dose of GLP1 agonist-  N/A GLP1 instructions:  N/A   Last dose of SGLT-2 inhibitors-  N/A SGLT-2 instructions: N/A   Blood Thinner Instructions:  Time Aspirin Instructions: Last Dose:  Activity level:  Can go up a flight of stairs and perform activities of daily living without stopping and without symptoms of chest pain or shortness of breath.  Able to exercise without symptoms  Unable to go up a flight of stairs without symptoms of     Anesthesia review:  CAD, HTN, OSA, shortness of breath  Patient denies shortness of breath, fever, cough and chest pain at PAT appointment  Patient verbalized understanding of instructions that were given to them at the PAT appointment. Patient was also instructed that they will need to review over the PAT instructions again at home before surgery.

## 2023-07-25 ENCOUNTER — Encounter (HOSPITAL_COMMUNITY): Payer: Self-pay | Admitting: Gastroenterology

## 2023-07-25 ENCOUNTER — Other Ambulatory Visit: Payer: Self-pay

## 2023-07-26 DIAGNOSIS — N400 Enlarged prostate without lower urinary tract symptoms: Secondary | ICD-10-CM | POA: Diagnosis not present

## 2023-07-26 DIAGNOSIS — K219 Gastro-esophageal reflux disease without esophagitis: Secondary | ICD-10-CM | POA: Diagnosis not present

## 2023-07-26 DIAGNOSIS — I10 Essential (primary) hypertension: Secondary | ICD-10-CM | POA: Diagnosis not present

## 2023-07-26 DIAGNOSIS — E1169 Type 2 diabetes mellitus with other specified complication: Secondary | ICD-10-CM | POA: Diagnosis not present

## 2023-07-26 DIAGNOSIS — K3189 Other diseases of stomach and duodenum: Secondary | ICD-10-CM | POA: Diagnosis not present

## 2023-07-26 DIAGNOSIS — Z9989 Dependence on other enabling machines and devices: Secondary | ICD-10-CM | POA: Diagnosis not present

## 2023-07-26 DIAGNOSIS — E119 Type 2 diabetes mellitus without complications: Secondary | ICD-10-CM | POA: Diagnosis not present

## 2023-07-31 ENCOUNTER — Ambulatory Visit (HOSPITAL_COMMUNITY): Payer: Medicare PPO | Admitting: Physician Assistant

## 2023-07-31 ENCOUNTER — Ambulatory Visit (HOSPITAL_COMMUNITY)
Admission: RE | Admit: 2023-07-31 | Discharge: 2023-07-31 | Disposition: A | Discharge status: Home / Self Care | Payer: Medicare PPO | Attending: Gastroenterology | Admitting: Gastroenterology

## 2023-07-31 ENCOUNTER — Other Ambulatory Visit: Payer: Self-pay

## 2023-07-31 ENCOUNTER — Encounter (HOSPITAL_COMMUNITY)
Admission: RE | Disposition: A | Discharge status: Home / Self Care | Payer: Self-pay | Source: Home / Self Care | Attending: Gastroenterology

## 2023-07-31 ENCOUNTER — Encounter (HOSPITAL_COMMUNITY): Payer: Self-pay | Admitting: Gastroenterology

## 2023-07-31 DIAGNOSIS — Z7984 Long term (current) use of oral hypoglycemic drugs: Secondary | ICD-10-CM | POA: Insufficient documentation

## 2023-07-31 DIAGNOSIS — K2270 Barrett's esophagus without dysplasia: Secondary | ICD-10-CM

## 2023-07-31 DIAGNOSIS — Z79899 Other long term (current) drug therapy: Secondary | ICD-10-CM | POA: Diagnosis not present

## 2023-07-31 DIAGNOSIS — I2510 Atherosclerotic heart disease of native coronary artery without angina pectoris: Secondary | ICD-10-CM

## 2023-07-31 DIAGNOSIS — K2970 Gastritis, unspecified, without bleeding: Secondary | ICD-10-CM

## 2023-07-31 DIAGNOSIS — K449 Diaphragmatic hernia without obstruction or gangrene: Secondary | ICD-10-CM | POA: Diagnosis not present

## 2023-07-31 DIAGNOSIS — I10 Essential (primary) hypertension: Secondary | ICD-10-CM | POA: Insufficient documentation

## 2023-07-31 DIAGNOSIS — G4730 Sleep apnea, unspecified: Secondary | ICD-10-CM | POA: Insufficient documentation

## 2023-07-31 DIAGNOSIS — K3189 Other diseases of stomach and duodenum: Secondary | ICD-10-CM | POA: Diagnosis not present

## 2023-07-31 DIAGNOSIS — K219 Gastro-esophageal reflux disease without esophagitis: Secondary | ICD-10-CM | POA: Diagnosis not present

## 2023-07-31 DIAGNOSIS — Z87891 Personal history of nicotine dependence: Secondary | ICD-10-CM | POA: Insufficient documentation

## 2023-07-31 DIAGNOSIS — K2990 Gastroduodenitis, unspecified, without bleeding: Secondary | ICD-10-CM | POA: Diagnosis not present

## 2023-07-31 DIAGNOSIS — E119 Type 2 diabetes mellitus without complications: Secondary | ICD-10-CM | POA: Diagnosis not present

## 2023-07-31 HISTORY — PX: FINE NEEDLE ASPIRATION: SHX5430

## 2023-07-31 HISTORY — PX: EUS: SHX5427

## 2023-07-31 HISTORY — PX: ESOPHAGOGASTRODUODENOSCOPY: SHX5428

## 2023-07-31 HISTORY — PX: BIOPSY: SHX5522

## 2023-07-31 LAB — GLUCOSE, CAPILLARY
Glucose-Capillary: 126 mg/dL — ABNORMAL HIGH (ref 70–99)
Glucose-Capillary: 107 mg/dL — ABNORMAL HIGH (ref 70–99)

## 2023-07-31 SURGERY — UPPER ENDOSCOPIC ULTRASOUND (EUS) RADIAL
Anesthesia: Monitor Anesthesia Care

## 2023-07-31 MED ORDER — PROPOFOL 10 MG/ML IV BOLUS
INTRAVENOUS | Status: DC | PRN
  Administered 2023-07-31: 10 mg via INTRAVENOUS
  Administered 2023-07-31 (×3): 20 mg via INTRAVENOUS

## 2023-07-31 MED ORDER — PROPOFOL 500 MG/50ML IV EMUL
INTRAVENOUS | Status: DC | PRN
  Administered 2023-07-31: 150 ug/kg/min via INTRAVENOUS

## 2023-07-31 MED ORDER — LACTATED RINGERS IV SOLN: INTRAVENOUS | Status: DC | PRN

## 2023-07-31 MED ORDER — LIDOCAINE 2% (20 MG/ML) 5 ML SYRINGE
INTRAMUSCULAR | Status: DC | PRN
  Administered 2023-07-31: 100 mg via INTRAVENOUS

## 2023-07-31 MED ORDER — PROPOFOL 1000 MG/100ML IV EMUL
INTRAVENOUS | Status: AC
  Filled 2023-07-31: qty 100

## 2023-07-31 NOTE — Transfer of Care (Signed)
Immediate Anesthesia Transfer of Care Note  Patient: Marc Gates  Procedure(s) Performed: UPPER ENDOSCOPIC ULTRASOUND (EUS) RADIAL ESOPHAGOGASTRODUODENOSCOPY (EGD) BIOPSY FINE NEEDLE ASPIRATION (FNA) LINEAR  Patient Location: PACU  Anesthesia Type:MAC  Level of Consciousness: sedated  Airway & Oxygen Therapy: Patient Spontanous Breathing and Patient connected to face mask oxygen  Post-op Assessment: Report given to RN and Post -op Vital signs reviewed and stable  Post vital signs: Reviewed and stable  Last Vitals:  Vitals Value Taken Time  BP    Temp    Pulse    Resp    SpO2      Last Pain:  Vitals:   07/31/23 0818  TempSrc: Temporal  PainSc: 0-No pain         Complications: No notable events documented.

## 2023-07-31 NOTE — Op Note (Signed)
[facility] Patient Name: Marc Gates Procedure Date: 07/31/2023 MRN: [id number] Attending MD: Corliss Parish , MD, [id number] Date of Birth: 1950/11/12 CSN: [id number] Age: 72 Admit Type: Ambulatory Procedure:                Upper EUS Indications:              Gastric mucosal mass/polyp found on endoscopy,                            Gastric deformity on endoscopy/Subepithelial tumor                            versus extrinsic compression, Heartburn, Suspected                            Barrett's esophagus Providers:                Corliss Parish, MD, Norman Clay, RN, Suzy Bouchard,                            RN, Marja Kays, Technician Referring MD:             Quentin Mulling Medicines:                Monitored Anesthesia Care Complications:            No immediate complications. Estimated Blood Loss:     Estimated blood loss was minimal. Procedure:                Pre-Anesthesia Assessment:                           - Prior to the procedure, a History and Physical                            was performed, and patient medications and                            allergies were reviewed. The patient's tolerance of                            previous anesthesia was also reviewed. The risks                            and benefits of the procedure and the sedation                            options and risks were discussed with the patient.                            All questions were answered, and informed consent                            was obtained. Prior Anticoagulants: The patient has                            taken no  anticoagulant or antiplatelet agents                            except for aspirin. ASA Grade Assessment: II - A                            patient with mild systemic disease. After reviewing                            the risks and benefits, the patient was deemed in                            satisfactory condition to undergo the  procedure.                           After obtaining informed consent, the endoscope was                            passed under direct vision. Throughout the                            procedure, the patient's blood pressure, pulse, and                            oxygen saturations were monitored continuously. The                            [id number]-[id number] ([id number]) Olympus therapeutic endoscope                            was introduced through the mouth, and advanced to                            the second part of duodenum. The GF-[id number]-AL5                            ([id number]) Olympus radial ultrasound scope was                            introduced through the mouth, and advanced to the                            stomach for ultrasound examination. The upper EUS                            was accomplished without difficulty. The patient                            tolerated the procedure. The [id number] ([id number])                            Olympus linear ultrasound scope was introduced  through the mouth, and advanced to the stomach for                            ultrasound examination. Scope In: Scope Out: Findings:      ENDOSCOPIC FINDING: :      No gross lesions were noted in the proximal esophagus and in the mid       esophagus.      The esophagus and gastroesophageal junction were examined with white       light and narrow band imaging (NBI) from a forward view and retroflexed       position. There were esophageal mucosal changes suggestive of       short-segment Barrett's esophagus, classified as Barrett's stage C0-M2       per Prague criteria. These changes involved the mucosa along an       irregular Z-line (38 cm from the incisors). Two tongues of       salmon-colored mucosa were present from 37 to 38 cm and a single       scattered island of salmon-colored mucosa was present at 36 cm. The       maximum longitudinal extent of these esophageal mucosal  changes was 2 cm       in length. Biopsies were taken with a cold forceps for histology from       37-38 cm. Biopsies were taken with a cold forceps for histology at 36 cm.      A 3 cm hiatal hernia was present.      Patchy mildly erythematous mucosa without bleeding was found in the       entire examined stomach. Biopsies were taken with a cold forceps for       histology and Helicobacter pylori testing.      A single 15 mm subepithelial papule (nodule) with no bleeding and no       stigmata of recent bleeding was found in the gastric antrum.      No gross lesions were noted in the duodenal bulb, in the first portion       of the duodenum and in the second portion of the duodenum.      ENDOSONOGRAPHIC FINDING: :      A lobulated intramural (subepithelial) lesion was found in the antrum of       the stomach. The lesion was hypoechoic. Sonographically, the lesion       appeared to originate from the muscularis propria (Layer 4). Scope       looping that was required to maintain an adequate position. The lesion       measured 16 mm (in maximum thickness). The lesion also measured 14 mm in       diameter. The outer endosonographic borders were irregular. Fine needle       biopsy was performed using the linear EUS. Color Doppler imaging was       utilized prior to needle puncture to confirm a lack of significant       vascular structures within the needle path. Six passes were made with       the 22 gauge Acquire biopsy needle using a transgastric approach. A       visible core of tissue was obtained. Preliminary cytologic examination       and touch preps were performed. Final cytology results are pending.      There was no sign of significant endosonographic abnormality  in the genu       of the pancreas, pancreatic body and pancreatic tail. No masses, no       cysts, no calcifications, the pancreatic duct was thin in caliber.      Endosonographic imaging in the visualized portion of the  liver showed no       mass.      No malignant-appearing lymph nodes were visualized in the left gastric       region (level 17), gastrohepatic ligament (level 18) and celiac region       (level 20).      The celiac region was visualized. Impression:               EGD impression:                           - No gross lesions in the proximal esophagus and in                            the mid esophagus.                           - Esophageal mucosal changes suggestive of                            short-segment Barrett's esophagus found distally,                            classified as Barrett's stage C0-M2 per Prague                            criteria. Biopsied as above.                           - 3 cm hiatal hernia.                           - Erythematous mucosa in the stomach. Biopsied.                           - A single subepithelial papule (nodule) found in                            the antrum of the stomach.                           - No gross lesions in the duodenal bulb, in the                            first portion of the duodenum and in the second                            portion of the duodenum.                           EUS impression:                           -  An intramural (subepithelial) lesion was found in                            the antrum of the stomach. The lesion appeared to                            originate from within the muscularis propria (Layer                            4). Cytology results are pending. 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 Fine needle                            biopsy performed.                           - There was no sign of significant pathology in the                            genu of the pancreas, pancreatic body and                            pancreatic tail.                           - No visualized  abnormality within the left lobe of                            the liver.                           - No malignant-appearing lymph nodes visualized. Moderate Sedation:      Not Applicable - Patient had care per Anesthesia. Recommendation:           - The patient will be observed post-procedure,                            until all discharge criteria are met.                           - Discharge patient to home.                           - Patient has a contact number available for                            emergencies. The signs and symptoms of potential                            delayed complications were discussed with  the                            patient. Return to normal activities tomorrow.                            Written discharge instructions were provided to the                            patient.                           - Resume previous diet.                           - Observe patient's clinical course.                           - Await cytology results and await path results.                           - Pending results, if this does turn out to be a                            GIST, then surveillance would be recommended based                            on this lesion being less than 2 cm in size.                           - If cytology is nondiagnostic, then repeat EUS in                            6 months and have forward linear EUS available for                            repeat attempt at sampling with a larger core                            needle.                           - Pending pathology will discuss potential role of                            a CT abdomen.                           - Continue present medications.                           - The findings and recommendations were discussed                            with the patient.                           -  The findings and recommendations were discussed                            with the  designated responsible adult. Procedure Code(s):        --- Professional ---                           [phone], Esophagogastroduodenoscopy, flexible,                            transoral; with transendoscopic ultrasound-guided                            intramural or transmural fine needle                            aspiration/biopsy(s), (includes endoscopic                            ultrasound examination limited to the esophagus,                            stomach or duodenum, and adjacent structures)                           43239, 59, Esophagogastroduodenoscopy, flexible,                            transoral; with biopsy, single or multiple Diagnosis Code(s):        --- Professional ---                           K22.70, Barrett's esophagus without dysplasia                           K44.9, Diaphragmatic hernia without obstruction or                            gangrene                           K31.89, Other diseases of stomach and duodenum                           R12, Heartburn CPT copyright 2022 American Medical Association. All rights reserved. The codes documented in this report are preliminary and upon coder review may  be revised to meet current compliance requirements. Corliss Parish, MD 07/31/2023 10:30:29 AM Number of Addenda: 0

## 2023-07-31 NOTE — H&P (Signed)
GASTROENTEROLOGY PROCEDURE H&P NOTE   Primary Care Physician: Thana Ates, MD  HPI: Marc Gates is a 72 y.o. male who presents for EGD/EUS for evaluation of Barrett's and for gastric nodule evaluation.  Past Medical History:  Diagnosis Date   Allergies    Anxiety    CPAP (continuous positive airway pressure) dependence    Diabetes mellitus without complication (HCC)    Edema of both lower extremities    Empyema (HCC)    GERD (gastroesophageal reflux disease)    Hepatitis 1980'S   HEPATITIS B   High cholesterol    Hypertension    Joint pain    Pneumonia    Sleep apnea    SOB (shortness of breath)    Past Surgical History:  Procedure Laterality Date   APPENDECTOMY  AGE 58 OR 13   COLONOSCOPY WITH PROPOFOL N/A 08/13/2013   Procedure: COLONOSCOPY WITH PROPOFOL;  Surgeon: Charolett Bumpers, MD;  Location: [facility];  Service: Endoscopy;  Laterality: N/A;   DECORTICATION Right 04/03/2019   Procedure: DECORTICATION OF RIGHT LUNG;  Surgeon: Alleen Borne, MD;  Location: [facility] OR;  Service: Thoracic;  Laterality: Right;   EMPYEMA DRAINAGE Right 04/03/2019   Procedure: EMPYEMA DRAINAGE;  Surgeon: Alleen Borne, MD;  Location: [facility] OR;  Service: Thoracic;  Laterality: Right;   EYE SURGERY Left    retinal detachment and cataract surgery   HERNIA REPAIR  65 WEEKS OLD   IR THORACENTESIS ASP PLEURAL SPACE W/IMG GUIDE  03/07/2019   LEFT HEART CATH AND CORONARY ANGIOGRAPHY N/A 04/06/2021   Procedure: LEFT HEART CATH AND CORONARY ANGIOGRAPHY;  Surgeon: Swazil[place], Peter M, MD;  Location: [facility] INVASIVE CV LAB;  Service: Cardiovascular;  Laterality: N/A;   PAIN PUMP IMPLANTATION  04/03/2019   Procedure: PLACEMENT OF ON-Q PAIN PUMP;  Surgeon: Alleen Borne, MD;  Location: [facility] OR;  Service: Thoracic;;   THORACOTOMY Right 04/03/2019   Procedure: THORACOTOMY MAJOR;  Surgeon: Alleen Borne, MD;  Location: [facility] OR;  Service: Thoracic;  Laterality: Right;   No current facility-administered medications  for this encounter.   Facility-Administered Medications Ordered in Other Encounters  Medication Dose Route Frequency Provider Last Rate Last Admin   lactated ringers infusion   Intravenous Continuous PRN Doran Clay, CRNA   New Bag at 07/31/23 [phone]   No current facility-administered medications for this encounter.  Facility-Administered Medications Ordered in Other Encounters:    lactated ringers infusion, , Intravenous, Continuous PRN, Doran Clay, CRNA, New Bag at 07/31/23 [phone] No Known Allergies Family History  Problem Relation Age of Onset   High blood pressure Mother    High Cholesterol Mother    Heart disease Mother    Anxiety disorder Mother    Obesity Mother    Diabetes Father    High Cholesterol Father    Cancer Father    Seizures Brother    Colon cancer Neg Hx    Esophageal cancer Neg Hx    Stomach cancer Neg Hx    Social History   Socioeconomic History   Marital status: Single    Spouse name: Not on file   Number of children: 0   Years of education: Not on file   Highest education level: Not on file  Occupational History   Occupation: Retired Audiological scientist Professor  Tobacco Use   Smoking status: Former    Current packs/day: 1.50    Average packs/day: 1.5 packs/day for 15.0 years (22.5 ttl pk-yrs)  Types: Cigarettes    Passive exposure: Past   Smokeless tobacco: Never  Vaping Use   Vaping status: Never Used  Substance and Sexual Activity   Alcohol use: Yes    Comment: rare   Drug use: No    Comment: QUIT SMOKING 25 YRS AGO   Sexual activity: Not on file  Other Topics Concern   Not on file  Social History Narrative   Not on file   Social Determinants of Health   Financial Resource Strain: Not on file  Food Insecurity: Not on file  Transportation Needs: Not on file  Physical Activity: Not on file  Stress: Not on file  Social Connections: Not on file  Intimate Partner Violence: Not on file    Physical Exam: Today's Vitals    07/25/23 0911 07/31/23 0818  BP:  (!) 170/78  Pulse:  84  Resp:  (!) 21  Temp:  98.1 F (36.7 C)  TempSrc:  Temporal  SpO2:  97%  Weight: 105.2 kg 105.2 kg  Height: 5\' 10"  (1.778 m) 5\' 10"  (1.778 m)  PainSc:  0-No pain   Body mass index is 33.29 kg/m. GEN: NAD EYE: Sclerae anicteric ENT: MMM CV: Non-tachycardic GI: Soft, NT/ND NEURO:  Alert & Oriented x 3  Lab Results: No results for input(s): "WBC", "HGB", "HCT", "PLT" in the last 72 hours. BMET No results for input(s): "NA", "K", "CL", "CO2", "GLUCOSE", "BUN", "CREATININE", "CALCIUM" in the last 72 hours. LFT No results for input(s): "PROT", "ALBUMIN", "AST", "ALT", "ALKPHOS", "BILITOT", "BILIDIR", "IBILI" in the last 72 hours. PT/INR No results for input(s): "LABPROT", "INR" in the last 72 hours.   Impression / Plan: This is a 72 y.o.male who presents for EGD/EUS for evaluation of Barrett's and for gastric nodule evaluation.  The risks of an EUS including intestinal perforation, bleeding, infection, aspiration, and medication effects were discussed as was the possibility it may not give a definitive diagnosis if a biopsy is performed.  When a biopsy of the pancreas is done as part of the EUS, there is an additional risk of pancreatitis at the rate of about 1-2%.  It was explained that procedure related pancreatitis is typically mild, although it can be severe and even life threatening, which is why we do not perform random pancreatic biopsies and only biopsy a lesion/area we feel is concerning enough to warrant the risk.   The risks and benefits of endoscopic evaluation/treatment were discussed with the patient and/or family; these include but are not limited to the risk of perforation, infection, bleeding, missed lesions, lack of diagnosis, severe illness requiring hospitalization, as well as anesthesia and sedation related illnesses.  The patient's history has been reviewed, patient examined, no change in status, and deemed  stable for procedure.  The patient and/or family is agreeable to proceed.    Corliss Parish, MD [facility] Office # [phone]

## 2023-07-31 NOTE — Anesthesia Preprocedure Evaluation (Addendum)
Anesthesia Evaluation  Patient identified by MRN, date of birth, ID band Patient awake    Reviewed: Allergy & Precautions, H&P , NPO status , Patient's Chart, lab work & pertinent test results  Airway Mallampati: II  TM Distance: >3 FB Neck ROM: Full    Dental no notable dental hx. (+) Teeth Intact, Dental Advisory Given   Pulmonary sleep apnea , former smoker   Pulmonary exam normal breath sounds clear to auscultation       Cardiovascular hypertension, Pt. on medications  Rhythm:Regular Rate:Normal     Neuro/Psych   Anxiety     negative neurological ROS     GI/Hepatic Neg liver ROS,GERD  Medicated,,  Endo/Other  diabetes, Type 2, Oral Hypoglycemic Agents    Renal/GU negative Renal ROS  negative genitourinary   Musculoskeletal   Abdominal   Peds  Hematology negative hematology ROS (+)   Anesthesia Other Findings   Reproductive/Obstetrics negative OB ROS                             Anesthesia Physical Anesthesia Plan  ASA: 3  Anesthesia Plan: MAC   Post-op Pain Management: Minimal or no pain anticipated   Induction: Intravenous  PONV Risk Score and Plan: 1 and Propofol infusion  Airway Management Planned: Natural Airway and Simple Face Mask  Additional Equipment:   Intra-op Plan:   Post-operative Plan:   Informed Consent: I have reviewed the patients History and Physical, chart, labs and discussed the procedure including the risks, benefits and alternatives for the proposed anesthesia with the patient or authorized representative who has indicated his/her understanding and acceptance.     Dental advisory given  Plan Discussed with: CRNA  Anesthesia Plan Comments:        Anesthesia Quick Evaluation

## 2023-07-31 NOTE — Discharge Instructions (Signed)
YOU HAD AN ENDOSCOPIC PROCEDURE TODAY: Refer to the procedure report and other information in the discharge instructions given to you for any specific questions about what was found during the examination. If this information does not answer your questions, please call [facility] office at [phone] to clarify.   YOU SHOULD EXPECT: Some feelings of bloating in the abdomen. Passage of more gas than usual. Walking can help get rid of the air that was put into your GI tract during the procedure and reduce the bloating.  DIET: Your first meal following the procedure should be a light meal and then it is ok to progress to your normal diet. A half-sandwich or bowl of soup is an example of a good first meal. Heavy or fried foods are harder to digest and may make you feel nauseous or bloated. Drink plenty of fluids but you should avoid alcoholic beverages for 24 hours. If you had a esophageal dilation, please see attached instructions for diet.    ACTIVITY: Your care partner should take you home directly after the procedure. You should plan to take it easy, moving slowly for the rest of the day. You can resume normal activity the day after the procedure however YOU SHOULD NOT DRIVE, use power tools, machinery or perform tasks that involve climbing or major physical exertion for 24 hours (because of the sedation medicines used during the test).   SYMPTOMS TO REPORT IMMEDIATELY: A gastroenterologist can be reached at any hour. Please call [phone]  for any of the following symptoms:   Following upper endoscopy (EGD, EUS, ERCP, esophageal dilation) Vomiting of blood or coffee ground material  New, significant abdominal pain  New, significant chest pain or pain under the shoulder blades  Painful or persistently difficult swallowing  New shortness of breath  Black, tarry-looking or red, bloody stools  FOLLOW UP:  If any biopsies were taken you will be contacted by phone or by letter within the next 1-3  weeks. Call [phone]  if you have not heard about the biopsies in 3 weeks.  Please also call with any specific questions about appointments or follow up tests.

## 2023-07-31 NOTE — Anesthesia Postprocedure Evaluation (Signed)
Anesthesia Post Note  Patient: CLINTEN HOUSE  Procedure(s) Performed: UPPER ENDOSCOPIC ULTRASOUND (EUS) RADIAL ESOPHAGOGASTRODUODENOSCOPY (EGD) BIOPSY FINE NEEDLE ASPIRATION (FNA) LINEAR     Patient location during evaluation: Endoscopy Anesthesia Type: MAC Level of consciousness: awake and alert Pain management: pain level controlled Vital Signs Assessment: post-procedure vital signs reviewed and stable Respiratory status: spontaneous breathing, nonlabored ventilation and respiratory function stable Cardiovascular status: stable and blood pressure returned to baseline Postop Assessment: no apparent nausea or vomiting Anesthetic complications: no  No notable events documented.  Last Vitals:  Vitals:   07/31/23 1030 07/31/23 1040  BP: 123/68 138/71  Pulse: 66 (!) 56  Resp: 18 17  Temp:    SpO2: 93% 95%    Last Pain:  Vitals:   07/31/23 1040  TempSrc:   PainSc: 0-No pain                 Jennah Satchell,W. EDMOND

## 2023-08-01 ENCOUNTER — Encounter: Payer: Self-pay | Admitting: Gastroenterology

## 2023-08-01 DIAGNOSIS — J3089 Other allergic rhinitis: Secondary | ICD-10-CM | POA: Diagnosis not present

## 2023-08-01 DIAGNOSIS — J3081 Allergic rhinitis due to animal (cat) (dog) hair and dander: Secondary | ICD-10-CM | POA: Diagnosis not present

## 2023-08-01 DIAGNOSIS — J301 Allergic rhinitis due to pollen: Secondary | ICD-10-CM | POA: Diagnosis not present

## 2023-08-01 LAB — SURGICAL PATHOLOGY

## 2023-08-01 LAB — CYTOLOGY - NON PAP

## 2023-08-02 ENCOUNTER — Encounter (HOSPITAL_COMMUNITY): Payer: Self-pay | Admitting: Gastroenterology

## 2023-08-02 ENCOUNTER — Telehealth: Payer: Self-pay

## 2023-08-02 DIAGNOSIS — K3189 Other diseases of stomach and duodenum: Secondary | ICD-10-CM

## 2023-08-02 NOTE — Telephone Encounter (Signed)
schedule CT abdomen for further evaluation of gastric nodule

## 2023-08-02 NOTE — Telephone Encounter (Signed)
CT order has been entered and sent to the schedulers

## 2023-08-07 DIAGNOSIS — J3081 Allergic rhinitis due to animal (cat) (dog) hair and dander: Secondary | ICD-10-CM | POA: Diagnosis not present

## 2023-08-07 DIAGNOSIS — J3089 Other allergic rhinitis: Secondary | ICD-10-CM | POA: Diagnosis not present

## 2023-08-07 DIAGNOSIS — J301 Allergic rhinitis due to pollen: Secondary | ICD-10-CM | POA: Diagnosis not present

## 2023-08-09 ENCOUNTER — Encounter (HOSPITAL_COMMUNITY): Payer: Self-pay

## 2023-08-09 ENCOUNTER — Ambulatory Visit (HOSPITAL_COMMUNITY)
Admission: RE | Admit: 2023-08-09 | Discharge: 2023-08-09 | Disposition: A | Discharge status: Home / Self Care | Payer: Medicare PPO | Source: Ambulatory Visit | Attending: Gastroenterology | Admitting: Gastroenterology

## 2023-08-09 DIAGNOSIS — K3189 Other diseases of stomach and duodenum: Secondary | ICD-10-CM | POA: Insufficient documentation

## 2023-08-09 DIAGNOSIS — N281 Cyst of kidney, acquired: Secondary | ICD-10-CM | POA: Diagnosis not present

## 2023-08-09 DIAGNOSIS — I152 Hypertension secondary to endocrine disorders: Secondary | ICD-10-CM | POA: Insufficient documentation

## 2023-08-09 DIAGNOSIS — E1159 Type 2 diabetes mellitus with other circulatory complications: Secondary | ICD-10-CM | POA: Insufficient documentation

## 2023-08-09 DIAGNOSIS — N200 Calculus of kidney: Secondary | ICD-10-CM | POA: Diagnosis not present

## 2023-08-09 LAB — POCT I-STAT CREATININE: Creatinine, Ser: 0.9 mg/dL (ref 0.61–1.24)

## 2023-08-09 MED ORDER — IOHEXOL 300 MG/ML  SOLN
100.0000 mL | Freq: Once | INTRAMUSCULAR | Status: AC | PRN
  Administered 2023-08-09: 100 mL via INTRAVENOUS

## 2023-08-09 MED ORDER — SODIUM CHLORIDE (PF) 0.9 % IJ SOLN
INTRAMUSCULAR | Status: AC
  Filled 2023-08-09: qty 50

## 2023-08-14 DIAGNOSIS — J301 Allergic rhinitis due to pollen: Secondary | ICD-10-CM | POA: Diagnosis not present

## 2023-08-14 DIAGNOSIS — J3089 Other allergic rhinitis: Secondary | ICD-10-CM | POA: Diagnosis not present

## 2023-08-14 DIAGNOSIS — J3081 Allergic rhinitis due to animal (cat) (dog) hair and dander: Secondary | ICD-10-CM | POA: Diagnosis not present

## 2023-08-21 DIAGNOSIS — J3089 Other allergic rhinitis: Secondary | ICD-10-CM | POA: Diagnosis not present

## 2023-08-21 DIAGNOSIS — J3081 Allergic rhinitis due to animal (cat) (dog) hair and dander: Secondary | ICD-10-CM | POA: Diagnosis not present

## 2023-08-21 DIAGNOSIS — J301 Allergic rhinitis due to pollen: Secondary | ICD-10-CM | POA: Diagnosis not present

## 2023-08-23 DIAGNOSIS — M1712 Unilateral primary osteoarthritis, left knee: Secondary | ICD-10-CM | POA: Diagnosis not present

## 2023-08-23 DIAGNOSIS — M1711 Unilateral primary osteoarthritis, right knee: Secondary | ICD-10-CM | POA: Diagnosis not present

## 2023-08-23 DIAGNOSIS — M170 Bilateral primary osteoarthritis of knee: Secondary | ICD-10-CM | POA: Diagnosis not present

## 2023-08-28 DIAGNOSIS — J3081 Allergic rhinitis due to animal (cat) (dog) hair and dander: Secondary | ICD-10-CM | POA: Diagnosis not present

## 2023-08-28 DIAGNOSIS — J3089 Other allergic rhinitis: Secondary | ICD-10-CM | POA: Diagnosis not present

## 2023-08-28 DIAGNOSIS — J301 Allergic rhinitis due to pollen: Secondary | ICD-10-CM | POA: Diagnosis not present

## 2023-09-04 DIAGNOSIS — J3081 Allergic rhinitis due to animal (cat) (dog) hair and dander: Secondary | ICD-10-CM | POA: Diagnosis not present

## 2023-09-04 DIAGNOSIS — J3089 Other allergic rhinitis: Secondary | ICD-10-CM | POA: Diagnosis not present

## 2023-09-04 DIAGNOSIS — J301 Allergic rhinitis due to pollen: Secondary | ICD-10-CM | POA: Diagnosis not present

## 2023-09-11 DIAGNOSIS — J3089 Other allergic rhinitis: Secondary | ICD-10-CM | POA: Diagnosis not present

## 2023-09-11 DIAGNOSIS — J301 Allergic rhinitis due to pollen: Secondary | ICD-10-CM | POA: Diagnosis not present

## 2023-09-11 DIAGNOSIS — J3081 Allergic rhinitis due to animal (cat) (dog) hair and dander: Secondary | ICD-10-CM | POA: Diagnosis not present

## 2023-09-12 DIAGNOSIS — H2511 Age-related nuclear cataract, right eye: Secondary | ICD-10-CM | POA: Diagnosis not present

## 2023-09-12 DIAGNOSIS — H524 Presbyopia: Secondary | ICD-10-CM | POA: Diagnosis not present

## 2023-09-12 DIAGNOSIS — Z961 Presence of intraocular lens: Secondary | ICD-10-CM | POA: Diagnosis not present

## 2023-09-12 DIAGNOSIS — E119 Type 2 diabetes mellitus without complications: Secondary | ICD-10-CM | POA: Diagnosis not present

## 2023-09-12 DIAGNOSIS — H40013 Open angle with borderline findings, low risk, bilateral: Secondary | ICD-10-CM | POA: Diagnosis not present

## 2023-09-12 DIAGNOSIS — H40053 Ocular hypertension, bilateral: Secondary | ICD-10-CM | POA: Diagnosis not present

## 2023-09-12 DIAGNOSIS — H53143 Visual discomfort, bilateral: Secondary | ICD-10-CM | POA: Diagnosis not present

## 2023-09-12 DIAGNOSIS — H33002 Unspecified retinal detachment with retinal break, left eye: Secondary | ICD-10-CM | POA: Diagnosis not present

## 2023-09-12 DIAGNOSIS — Z9842 Cataract extraction status, left eye: Secondary | ICD-10-CM | POA: Diagnosis not present

## 2023-09-12 DIAGNOSIS — H59812 Chorioretinal scars after surgery for detachment, left eye: Secondary | ICD-10-CM | POA: Diagnosis not present

## 2023-09-13 DIAGNOSIS — J301 Allergic rhinitis due to pollen: Secondary | ICD-10-CM | POA: Diagnosis not present

## 2023-09-18 DIAGNOSIS — J301 Allergic rhinitis due to pollen: Secondary | ICD-10-CM | POA: Diagnosis not present

## 2023-09-18 DIAGNOSIS — J3089 Other allergic rhinitis: Secondary | ICD-10-CM | POA: Diagnosis not present

## 2023-09-18 DIAGNOSIS — J3081 Allergic rhinitis due to animal (cat) (dog) hair and dander: Secondary | ICD-10-CM | POA: Diagnosis not present

## 2023-09-25 DIAGNOSIS — J3081 Allergic rhinitis due to animal (cat) (dog) hair and dander: Secondary | ICD-10-CM | POA: Diagnosis not present

## 2023-09-25 DIAGNOSIS — J3089 Other allergic rhinitis: Secondary | ICD-10-CM | POA: Diagnosis not present

## 2023-09-25 DIAGNOSIS — J301 Allergic rhinitis due to pollen: Secondary | ICD-10-CM | POA: Diagnosis not present

## 2023-09-26 DIAGNOSIS — G4733 Obstructive sleep apnea (adult) (pediatric): Secondary | ICD-10-CM | POA: Diagnosis not present

## 2023-10-02 DIAGNOSIS — J3089 Other allergic rhinitis: Secondary | ICD-10-CM | POA: Diagnosis not present

## 2023-10-02 DIAGNOSIS — J3081 Allergic rhinitis due to animal (cat) (dog) hair and dander: Secondary | ICD-10-CM | POA: Diagnosis not present

## 2023-10-02 DIAGNOSIS — J301 Allergic rhinitis due to pollen: Secondary | ICD-10-CM | POA: Diagnosis not present

## 2023-10-09 DIAGNOSIS — J3081 Allergic rhinitis due to animal (cat) (dog) hair and dander: Secondary | ICD-10-CM | POA: Diagnosis not present

## 2023-10-09 DIAGNOSIS — J3089 Other allergic rhinitis: Secondary | ICD-10-CM | POA: Diagnosis not present

## 2023-10-09 DIAGNOSIS — J301 Allergic rhinitis due to pollen: Secondary | ICD-10-CM | POA: Diagnosis not present

## 2023-10-23 DIAGNOSIS — L851 Acquired keratosis [keratoderma] palmaris et plantaris: Secondary | ICD-10-CM | POA: Diagnosis not present

## 2023-10-23 DIAGNOSIS — S90222A Contusion of left lesser toe(s) with damage to nail, initial encounter: Secondary | ICD-10-CM | POA: Diagnosis not present

## 2023-10-23 DIAGNOSIS — M792 Neuralgia and neuritis, unspecified: Secondary | ICD-10-CM | POA: Diagnosis not present

## 2023-10-23 DIAGNOSIS — I70203 Unspecified atherosclerosis of native arteries of extremities, bilateral legs: Secondary | ICD-10-CM | POA: Diagnosis not present

## 2023-10-23 DIAGNOSIS — J3089 Other allergic rhinitis: Secondary | ICD-10-CM | POA: Diagnosis not present

## 2023-10-23 DIAGNOSIS — S90221A Contusion of right lesser toe(s) with damage to nail, initial encounter: Secondary | ICD-10-CM | POA: Diagnosis not present

## 2023-10-23 DIAGNOSIS — L603 Nail dystrophy: Secondary | ICD-10-CM | POA: Diagnosis not present

## 2023-10-23 DIAGNOSIS — J301 Allergic rhinitis due to pollen: Secondary | ICD-10-CM | POA: Diagnosis not present

## 2023-10-23 DIAGNOSIS — E1151 Type 2 diabetes mellitus with diabetic peripheral angiopathy without gangrene: Secondary | ICD-10-CM | POA: Diagnosis not present

## 2023-10-23 DIAGNOSIS — B351 Tinea unguium: Secondary | ICD-10-CM | POA: Diagnosis not present

## 2023-10-23 DIAGNOSIS — E1142 Type 2 diabetes mellitus with diabetic polyneuropathy: Secondary | ICD-10-CM | POA: Diagnosis not present

## 2023-11-02 DIAGNOSIS — J301 Allergic rhinitis due to pollen: Secondary | ICD-10-CM | POA: Diagnosis not present

## 2023-11-02 DIAGNOSIS — J3081 Allergic rhinitis due to animal (cat) (dog) hair and dander: Secondary | ICD-10-CM | POA: Diagnosis not present

## 2023-11-02 DIAGNOSIS — J3089 Other allergic rhinitis: Secondary | ICD-10-CM | POA: Diagnosis not present

## 2023-11-09 DIAGNOSIS — J301 Allergic rhinitis due to pollen: Secondary | ICD-10-CM | POA: Diagnosis not present

## 2023-11-10 DIAGNOSIS — E1142 Type 2 diabetes mellitus with diabetic polyneuropathy: Secondary | ICD-10-CM | POA: Diagnosis not present

## 2023-11-10 DIAGNOSIS — B351 Tinea unguium: Secondary | ICD-10-CM | POA: Diagnosis not present

## 2023-11-10 DIAGNOSIS — E1151 Type 2 diabetes mellitus with diabetic peripheral angiopathy without gangrene: Secondary | ICD-10-CM | POA: Diagnosis not present

## 2023-11-10 DIAGNOSIS — L851 Acquired keratosis [keratoderma] palmaris et plantaris: Secondary | ICD-10-CM | POA: Diagnosis not present

## 2023-11-10 DIAGNOSIS — M792 Neuralgia and neuritis, unspecified: Secondary | ICD-10-CM | POA: Diagnosis not present

## 2023-11-10 DIAGNOSIS — I70203 Unspecified atherosclerosis of native arteries of extremities, bilateral legs: Secondary | ICD-10-CM | POA: Diagnosis not present

## 2023-11-10 DIAGNOSIS — L603 Nail dystrophy: Secondary | ICD-10-CM | POA: Diagnosis not present

## 2023-11-14 DIAGNOSIS — M1711 Unilateral primary osteoarthritis, right knee: Secondary | ICD-10-CM | POA: Diagnosis not present

## 2023-11-14 DIAGNOSIS — J301 Allergic rhinitis due to pollen: Secondary | ICD-10-CM | POA: Diagnosis not present

## 2023-11-15 DIAGNOSIS — M1712 Unilateral primary osteoarthritis, left knee: Secondary | ICD-10-CM | POA: Diagnosis not present

## 2023-11-16 DIAGNOSIS — I700 Atherosclerosis of aorta: Secondary | ICD-10-CM | POA: Diagnosis not present

## 2023-11-16 DIAGNOSIS — K219 Gastro-esophageal reflux disease without esophagitis: Secondary | ICD-10-CM | POA: Diagnosis not present

## 2023-11-16 DIAGNOSIS — I2510 Atherosclerotic heart disease of native coronary artery without angina pectoris: Secondary | ICD-10-CM | POA: Diagnosis not present

## 2023-11-16 DIAGNOSIS — E559 Vitamin D deficiency, unspecified: Secondary | ICD-10-CM | POA: Diagnosis not present

## 2023-11-16 DIAGNOSIS — E781 Pure hyperglyceridemia: Secondary | ICD-10-CM | POA: Diagnosis not present

## 2023-11-16 DIAGNOSIS — I10 Essential (primary) hypertension: Secondary | ICD-10-CM | POA: Diagnosis not present

## 2023-11-16 DIAGNOSIS — Z79899 Other long term (current) drug therapy: Secondary | ICD-10-CM | POA: Diagnosis not present

## 2023-11-16 DIAGNOSIS — E1169 Type 2 diabetes mellitus with other specified complication: Secondary | ICD-10-CM | POA: Diagnosis not present

## 2023-11-16 DIAGNOSIS — Z0000 Encounter for general adult medical examination without abnormal findings: Secondary | ICD-10-CM | POA: Diagnosis not present

## 2023-11-20 DIAGNOSIS — J301 Allergic rhinitis due to pollen: Secondary | ICD-10-CM | POA: Diagnosis not present

## 2023-11-21 DIAGNOSIS — M1711 Unilateral primary osteoarthritis, right knee: Secondary | ICD-10-CM | POA: Diagnosis not present

## 2023-11-22 DIAGNOSIS — M1712 Unilateral primary osteoarthritis, left knee: Secondary | ICD-10-CM | POA: Diagnosis not present

## 2023-11-28 DIAGNOSIS — M1711 Unilateral primary osteoarthritis, right knee: Secondary | ICD-10-CM | POA: Diagnosis not present

## 2023-11-28 DIAGNOSIS — J301 Allergic rhinitis due to pollen: Secondary | ICD-10-CM | POA: Diagnosis not present

## 2023-11-28 DIAGNOSIS — J3081 Allergic rhinitis due to animal (cat) (dog) hair and dander: Secondary | ICD-10-CM | POA: Diagnosis not present

## 2023-11-28 DIAGNOSIS — J3089 Other allergic rhinitis: Secondary | ICD-10-CM | POA: Diagnosis not present

## 2023-11-29 DIAGNOSIS — M1712 Unilateral primary osteoarthritis, left knee: Secondary | ICD-10-CM | POA: Diagnosis not present

## 2023-12-05 DIAGNOSIS — J301 Allergic rhinitis due to pollen: Secondary | ICD-10-CM | POA: Diagnosis not present

## 2023-12-05 DIAGNOSIS — J3089 Other allergic rhinitis: Secondary | ICD-10-CM | POA: Diagnosis not present

## 2023-12-05 DIAGNOSIS — J3081 Allergic rhinitis due to animal (cat) (dog) hair and dander: Secondary | ICD-10-CM | POA: Diagnosis not present

## 2023-12-11 DIAGNOSIS — J3081 Allergic rhinitis due to animal (cat) (dog) hair and dander: Secondary | ICD-10-CM | POA: Diagnosis not present

## 2023-12-11 DIAGNOSIS — J3089 Other allergic rhinitis: Secondary | ICD-10-CM | POA: Diagnosis not present

## 2023-12-11 DIAGNOSIS — J301 Allergic rhinitis due to pollen: Secondary | ICD-10-CM | POA: Diagnosis not present

## 2023-12-16 DIAGNOSIS — R051 Acute cough: Secondary | ICD-10-CM | POA: Diagnosis not present

## 2023-12-16 DIAGNOSIS — B349 Viral infection, unspecified: Secondary | ICD-10-CM | POA: Diagnosis not present

## 2023-12-18 DIAGNOSIS — J301 Allergic rhinitis due to pollen: Secondary | ICD-10-CM | POA: Diagnosis not present

## 2023-12-18 DIAGNOSIS — J3081 Allergic rhinitis due to animal (cat) (dog) hair and dander: Secondary | ICD-10-CM | POA: Diagnosis not present

## 2023-12-18 DIAGNOSIS — J3089 Other allergic rhinitis: Secondary | ICD-10-CM | POA: Diagnosis not present

## 2023-12-22 DIAGNOSIS — G4733 Obstructive sleep apnea (adult) (pediatric): Secondary | ICD-10-CM | POA: Diagnosis not present

## 2023-12-25 DIAGNOSIS — J301 Allergic rhinitis due to pollen: Secondary | ICD-10-CM | POA: Diagnosis not present

## 2023-12-25 DIAGNOSIS — J3089 Other allergic rhinitis: Secondary | ICD-10-CM | POA: Diagnosis not present

## 2023-12-25 DIAGNOSIS — J3081 Allergic rhinitis due to animal (cat) (dog) hair and dander: Secondary | ICD-10-CM | POA: Diagnosis not present

## 2024-01-01 DIAGNOSIS — R059 Cough, unspecified: Secondary | ICD-10-CM | POA: Diagnosis not present

## 2024-01-01 DIAGNOSIS — J3089 Other allergic rhinitis: Secondary | ICD-10-CM | POA: Diagnosis not present

## 2024-01-01 DIAGNOSIS — J3081 Allergic rhinitis due to animal (cat) (dog) hair and dander: Secondary | ICD-10-CM | POA: Diagnosis not present

## 2024-01-01 DIAGNOSIS — J301 Allergic rhinitis due to pollen: Secondary | ICD-10-CM | POA: Diagnosis not present

## 2024-01-04 ENCOUNTER — Encounter: Payer: Self-pay | Admitting: Pulmonary Disease

## 2024-01-05 DIAGNOSIS — H40023 Open angle with borderline findings, high risk, bilateral: Secondary | ICD-10-CM | POA: Diagnosis not present

## 2024-01-05 DIAGNOSIS — H40053 Ocular hypertension, bilateral: Secondary | ICD-10-CM | POA: Diagnosis not present

## 2024-01-08 DIAGNOSIS — L851 Acquired keratosis [keratoderma] palmaris et plantaris: Secondary | ICD-10-CM | POA: Diagnosis not present

## 2024-01-08 DIAGNOSIS — M792 Neuralgia and neuritis, unspecified: Secondary | ICD-10-CM | POA: Diagnosis not present

## 2024-01-08 DIAGNOSIS — J3089 Other allergic rhinitis: Secondary | ICD-10-CM | POA: Diagnosis not present

## 2024-01-08 DIAGNOSIS — E1142 Type 2 diabetes mellitus with diabetic polyneuropathy: Secondary | ICD-10-CM | POA: Diagnosis not present

## 2024-01-08 DIAGNOSIS — B351 Tinea unguium: Secondary | ICD-10-CM | POA: Diagnosis not present

## 2024-01-08 DIAGNOSIS — J301 Allergic rhinitis due to pollen: Secondary | ICD-10-CM | POA: Diagnosis not present

## 2024-01-08 DIAGNOSIS — J3081 Allergic rhinitis due to animal (cat) (dog) hair and dander: Secondary | ICD-10-CM | POA: Diagnosis not present

## 2024-01-08 DIAGNOSIS — I70203 Unspecified atherosclerosis of native arteries of extremities, bilateral legs: Secondary | ICD-10-CM | POA: Diagnosis not present

## 2024-01-08 DIAGNOSIS — L603 Nail dystrophy: Secondary | ICD-10-CM | POA: Diagnosis not present

## 2024-01-08 DIAGNOSIS — E1151 Type 2 diabetes mellitus with diabetic peripheral angiopathy without gangrene: Secondary | ICD-10-CM | POA: Diagnosis not present

## 2024-01-15 DIAGNOSIS — J3081 Allergic rhinitis due to animal (cat) (dog) hair and dander: Secondary | ICD-10-CM | POA: Diagnosis not present

## 2024-01-15 DIAGNOSIS — J3089 Other allergic rhinitis: Secondary | ICD-10-CM | POA: Diagnosis not present

## 2024-01-15 DIAGNOSIS — J301 Allergic rhinitis due to pollen: Secondary | ICD-10-CM | POA: Diagnosis not present

## 2024-01-22 DIAGNOSIS — J3081 Allergic rhinitis due to animal (cat) (dog) hair and dander: Secondary | ICD-10-CM | POA: Diagnosis not present

## 2024-01-22 DIAGNOSIS — J3089 Other allergic rhinitis: Secondary | ICD-10-CM | POA: Diagnosis not present

## 2024-01-22 DIAGNOSIS — J301 Allergic rhinitis due to pollen: Secondary | ICD-10-CM | POA: Diagnosis not present

## 2024-02-06 DIAGNOSIS — J3089 Other allergic rhinitis: Secondary | ICD-10-CM | POA: Diagnosis not present

## 2024-02-06 DIAGNOSIS — J3081 Allergic rhinitis due to animal (cat) (dog) hair and dander: Secondary | ICD-10-CM | POA: Diagnosis not present

## 2024-02-06 DIAGNOSIS — J301 Allergic rhinitis due to pollen: Secondary | ICD-10-CM | POA: Diagnosis not present

## 2024-02-07 ENCOUNTER — Telehealth: Payer: Self-pay | Admitting: Gastroenterology

## 2024-02-07 NOTE — Telephone Encounter (Signed)
See alternate results note.  

## 2024-02-07 NOTE — Telephone Encounter (Signed)
 EUS has been entered for 04/04/24 at [facility] with GM at 1230 pm

## 2024-02-07 NOTE — Telephone Encounter (Signed)
 Received MyChart message stating that Dr. Meridee Score wanted to do a repeat procedure on him to look for.at a couple of other nodules.  He was told the office would contact him regarding this, but he has not been contacted as of yet.  Please call patient and let him know how to proceed.  Thank you.

## 2024-02-07 NOTE — Telephone Encounter (Signed)
 Patient called stated he received a message regarding the hospital appt because he was not aware of it, therefore needs to reschedule.

## 2024-02-07 NOTE — Telephone Encounter (Signed)
 Spoke with the pt and he asked to be moved to 05/02/24 at 8 am. Appt has been moved and the pt aware. New instructions sent to My CHart

## 2024-02-12 DIAGNOSIS — J301 Allergic rhinitis due to pollen: Secondary | ICD-10-CM | POA: Diagnosis not present

## 2024-02-12 DIAGNOSIS — J3081 Allergic rhinitis due to animal (cat) (dog) hair and dander: Secondary | ICD-10-CM | POA: Diagnosis not present

## 2024-02-12 DIAGNOSIS — J3089 Other allergic rhinitis: Secondary | ICD-10-CM | POA: Diagnosis not present

## 2024-02-19 ENCOUNTER — Telehealth: Payer: Self-pay | Admitting: Gastroenterology

## 2024-02-19 ENCOUNTER — Other Ambulatory Visit: Payer: Self-pay

## 2024-02-19 DIAGNOSIS — Z1211 Encounter for screening for malignant neoplasm of colon: Secondary | ICD-10-CM

## 2024-02-19 DIAGNOSIS — J301 Allergic rhinitis due to pollen: Secondary | ICD-10-CM | POA: Diagnosis not present

## 2024-02-19 DIAGNOSIS — J3089 Other allergic rhinitis: Secondary | ICD-10-CM | POA: Diagnosis not present

## 2024-02-19 MED ORDER — NA SULFATE-K SULFATE-MG SULF 17.5-3.13-1.6 GM/177ML PO SOLN: 1.0000 | Freq: Once | ORAL | 0 refills | Status: AC

## 2024-02-19 NOTE — Telephone Encounter (Signed)
 Colon has been added. Prep sent to the pharmacy. Referral added as well as time to the case. Pt instructions mailed and sent to My Chart.  The pt has been called and made aware and all questions answered

## 2024-02-19 NOTE — Telephone Encounter (Signed)
 As the patient is slightly overdue for his colonoscopy surveillance, I am okay with adding on colonoscopy. Please move forward with adding colonoscopy with upper EUS in hospital-based setting.  Please ensure you give me at least an additional 30 minutes of time for the procedure. Thanks. GM

## 2024-02-19 NOTE — Telephone Encounter (Signed)
 Dr Brice Campi do you want to add a colon to the upcoming EGD EUS? Looks like he was due 08/2023.

## 2024-02-19 NOTE — Telephone Encounter (Signed)
 Inbound call from patient stating that his PCP is wanting to see if he can add on having a colonoscopy  with his procedure on 6/26 at [facility] with Dr. Brice Campi. Please advise.

## 2024-02-26 DIAGNOSIS — J301 Allergic rhinitis due to pollen: Secondary | ICD-10-CM | POA: Diagnosis not present

## 2024-02-26 DIAGNOSIS — J3089 Other allergic rhinitis: Secondary | ICD-10-CM | POA: Diagnosis not present

## 2024-02-26 DIAGNOSIS — J3081 Allergic rhinitis due to animal (cat) (dog) hair and dander: Secondary | ICD-10-CM | POA: Diagnosis not present

## 2024-02-28 ENCOUNTER — Other Ambulatory Visit: Payer: Self-pay

## 2024-02-28 DIAGNOSIS — K3189 Other diseases of stomach and duodenum: Secondary | ICD-10-CM

## 2024-02-28 DIAGNOSIS — Z1211 Encounter for screening for malignant neoplasm of colon: Secondary | ICD-10-CM

## 2024-03-04 DIAGNOSIS — J3089 Other allergic rhinitis: Secondary | ICD-10-CM | POA: Diagnosis not present

## 2024-03-04 DIAGNOSIS — J301 Allergic rhinitis due to pollen: Secondary | ICD-10-CM | POA: Diagnosis not present

## 2024-03-05 DIAGNOSIS — R351 Nocturia: Secondary | ICD-10-CM | POA: Diagnosis not present

## 2024-03-05 DIAGNOSIS — Z125 Encounter for screening for malignant neoplasm of prostate: Secondary | ICD-10-CM | POA: Diagnosis not present

## 2024-03-05 DIAGNOSIS — N5201 Erectile dysfunction due to arterial insufficiency: Secondary | ICD-10-CM | POA: Diagnosis not present

## 2024-03-06 DIAGNOSIS — B351 Tinea unguium: Secondary | ICD-10-CM | POA: Diagnosis not present

## 2024-03-06 DIAGNOSIS — I70203 Unspecified atherosclerosis of native arteries of extremities, bilateral legs: Secondary | ICD-10-CM | POA: Diagnosis not present

## 2024-03-06 DIAGNOSIS — E1151 Type 2 diabetes mellitus with diabetic peripheral angiopathy without gangrene: Secondary | ICD-10-CM | POA: Diagnosis not present

## 2024-03-06 DIAGNOSIS — E1142 Type 2 diabetes mellitus with diabetic polyneuropathy: Secondary | ICD-10-CM | POA: Diagnosis not present

## 2024-03-06 DIAGNOSIS — L851 Acquired keratosis [keratoderma] palmaris et plantaris: Secondary | ICD-10-CM | POA: Diagnosis not present

## 2024-03-06 DIAGNOSIS — M792 Neuralgia and neuritis, unspecified: Secondary | ICD-10-CM | POA: Diagnosis not present

## 2024-03-06 DIAGNOSIS — L603 Nail dystrophy: Secondary | ICD-10-CM | POA: Diagnosis not present

## 2024-03-11 DIAGNOSIS — R052 Subacute cough: Secondary | ICD-10-CM | POA: Diagnosis not present

## 2024-03-11 DIAGNOSIS — J301 Allergic rhinitis due to pollen: Secondary | ICD-10-CM | POA: Diagnosis not present

## 2024-03-12 DIAGNOSIS — J301 Allergic rhinitis due to pollen: Secondary | ICD-10-CM | POA: Diagnosis not present

## 2024-03-18 DIAGNOSIS — J301 Allergic rhinitis due to pollen: Secondary | ICD-10-CM | POA: Diagnosis not present

## 2024-03-21 DIAGNOSIS — D72829 Elevated white blood cell count, unspecified: Secondary | ICD-10-CM | POA: Diagnosis not present

## 2024-03-21 DIAGNOSIS — E66812 Obesity, class 2: Secondary | ICD-10-CM | POA: Diagnosis not present

## 2024-03-21 DIAGNOSIS — I2510 Atherosclerotic heart disease of native coronary artery without angina pectoris: Secondary | ICD-10-CM | POA: Diagnosis not present

## 2024-03-21 DIAGNOSIS — N281 Cyst of kidney, acquired: Secondary | ICD-10-CM | POA: Diagnosis not present

## 2024-03-21 DIAGNOSIS — I10 Essential (primary) hypertension: Secondary | ICD-10-CM | POA: Diagnosis not present

## 2024-03-21 DIAGNOSIS — K219 Gastro-esophageal reflux disease without esophagitis: Secondary | ICD-10-CM | POA: Diagnosis not present

## 2024-03-21 DIAGNOSIS — Z1211 Encounter for screening for malignant neoplasm of colon: Secondary | ICD-10-CM | POA: Diagnosis not present

## 2024-03-21 DIAGNOSIS — K3189 Other diseases of stomach and duodenum: Secondary | ICD-10-CM | POA: Diagnosis not present

## 2024-03-21 DIAGNOSIS — E1169 Type 2 diabetes mellitus with other specified complication: Secondary | ICD-10-CM | POA: Diagnosis not present

## 2024-03-25 ENCOUNTER — Other Ambulatory Visit: Payer: Self-pay | Admitting: Internal Medicine

## 2024-03-25 ENCOUNTER — Encounter: Payer: Self-pay | Admitting: Internal Medicine

## 2024-03-25 DIAGNOSIS — J301 Allergic rhinitis due to pollen: Secondary | ICD-10-CM | POA: Diagnosis not present

## 2024-03-25 DIAGNOSIS — J3081 Allergic rhinitis due to animal (cat) (dog) hair and dander: Secondary | ICD-10-CM | POA: Diagnosis not present

## 2024-03-25 DIAGNOSIS — J3089 Other allergic rhinitis: Secondary | ICD-10-CM | POA: Diagnosis not present

## 2024-03-25 DIAGNOSIS — N281 Cyst of kidney, acquired: Secondary | ICD-10-CM

## 2024-03-26 ENCOUNTER — Ambulatory Visit
Admission: RE | Admit: 2024-03-26 | Discharge: 2024-03-26 | Disposition: A | Discharge status: Home / Self Care | Source: Ambulatory Visit | Attending: Internal Medicine | Admitting: Internal Medicine

## 2024-03-26 DIAGNOSIS — N281 Cyst of kidney, acquired: Secondary | ICD-10-CM | POA: Diagnosis not present

## 2024-03-26 DIAGNOSIS — G4733 Obstructive sleep apnea (adult) (pediatric): Secondary | ICD-10-CM | POA: Diagnosis not present

## 2024-04-02 DIAGNOSIS — J301 Allergic rhinitis due to pollen: Secondary | ICD-10-CM | POA: Diagnosis not present

## 2024-04-02 DIAGNOSIS — J3081 Allergic rhinitis due to animal (cat) (dog) hair and dander: Secondary | ICD-10-CM | POA: Diagnosis not present

## 2024-04-02 DIAGNOSIS — J3089 Other allergic rhinitis: Secondary | ICD-10-CM | POA: Diagnosis not present

## 2024-04-10 DIAGNOSIS — J301 Allergic rhinitis due to pollen: Secondary | ICD-10-CM | POA: Diagnosis not present

## 2024-04-15 DIAGNOSIS — J3081 Allergic rhinitis due to animal (cat) (dog) hair and dander: Secondary | ICD-10-CM | POA: Diagnosis not present

## 2024-04-15 DIAGNOSIS — J301 Allergic rhinitis due to pollen: Secondary | ICD-10-CM | POA: Diagnosis not present

## 2024-04-15 DIAGNOSIS — J3089 Other allergic rhinitis: Secondary | ICD-10-CM | POA: Diagnosis not present

## 2024-04-19 ENCOUNTER — Encounter: Payer: Self-pay | Admitting: Gastroenterology

## 2024-04-22 ENCOUNTER — Telehealth: Payer: Self-pay | Admitting: Gastroenterology

## 2024-04-22 DIAGNOSIS — J301 Allergic rhinitis due to pollen: Secondary | ICD-10-CM | POA: Diagnosis not present

## 2024-04-22 NOTE — Telephone Encounter (Signed)
 The pt has been instructed on metformin  and aware he will be called from [facility] endo as well to discuss. The pt has been advised of the information and verbalized understanding.

## 2024-04-22 NOTE — Telephone Encounter (Signed)
 Patient called and stated that he has a double procedure scheduled for a June the 26 th. Patient requesting to speak to the nurse regarding his can take his metformin .Please advise.

## 2024-04-24 ENCOUNTER — Telehealth: Payer: Self-pay | Admitting: Gastroenterology

## 2024-04-24 NOTE — Telephone Encounter (Signed)
Noted, talked to patient

## 2024-04-24 NOTE — Telephone Encounter (Signed)
 PT returning call because he has prep concerns along with medications he needs to stop prior to procedure. Please advise.

## 2024-04-24 NOTE — Telephone Encounter (Addendum)
 Procedure:EUS/Colonoscopy Procedure date: 05/02/24 Procedure location: [facility] Arrival Time: 6:30 am Spoke with the patient Y/N:   No, I left a detailed message 04/24/24 @ 9:21 am for the patient to return call   Yes, 04/24/24 @ 1:28 pm  Any prep concerns? No  Has the patient obtained the prep from the pharmacy ? Yes Do you have a care partner and transportation: Yes Any additional concerns? Yes, patient asked about his medications, Ozempic, ASA and metformin . Pt was told ok to take ASA, no metformin  the morning of the procedure and today is the last day for Ozempic not tomorrow. Pt was told that he would receive a call fro the hospital to go over any instructions or requirement they have.

## 2024-04-29 DIAGNOSIS — J3081 Allergic rhinitis due to animal (cat) (dog) hair and dander: Secondary | ICD-10-CM | POA: Diagnosis not present

## 2024-04-29 DIAGNOSIS — J3089 Other allergic rhinitis: Secondary | ICD-10-CM | POA: Diagnosis not present

## 2024-04-29 DIAGNOSIS — J301 Allergic rhinitis due to pollen: Secondary | ICD-10-CM | POA: Diagnosis not present

## 2024-05-01 ENCOUNTER — Encounter (HOSPITAL_COMMUNITY): Payer: Self-pay | Admitting: Gastroenterology

## 2024-05-01 NOTE — Anesthesia Preprocedure Evaluation (Signed)
 Anesthesia Evaluation  Patient identified by MRN, date of birth, ID band Patient awake    Reviewed: Allergy & Precautions, NPO status , Patient's Chart, lab work & pertinent test results  History of Anesthesia Complications Negative for: history of anesthetic complications  Airway Mallampati: I  TM Distance: >3 FB Neck ROM: Full    Dental  (+) Dental Advisory Given   Pulmonary sleep apnea and Continuous Positive Airway Pressure Ventilation , COPD,  COPD inhaler, former smoker   breath sounds clear to auscultation       Cardiovascular hypertension, Pt. on medications (-) angina + CAD ('22 cath :mild, non-obstructive)   Rhythm:Regular Rate:Normal  '22 ECHO: EF 55-60%, normal LVF, Grade 1 DD, normal RVF, no significant valvular abnormalities    Neuro/Psych   Anxiety     negative neurological ROS     GI/Hepatic Neg liver ROS,GERD  Medicated and Controlled,,  Endo/Other  diabetes (glu 100), Oral Hypoglycemic Agents  Mounjaro: 2 weeks ago BMI 35  Renal/GU negative Renal ROS     Musculoskeletal   Abdominal   Peds  Hematology negative hematology ROS (+)   Anesthesia Other Findings   Reproductive/Obstetrics                             Anesthesia Physical Anesthesia Plan  ASA: 3  Anesthesia Plan: MAC   Post-op Pain Management: Minimal or no pain anticipated   Induction:   PONV Risk Score and Plan: 1 and Treatment may vary due to age or medical condition  Airway Management Planned: Natural Airway and Simple Face Mask  Additional Equipment: None  Intra-op Plan:   Post-operative Plan:   Informed Consent: I have reviewed the patients History and Physical, chart, labs and discussed the procedure including the risks, benefits and alternatives for the proposed anesthesia with the patient or authorized representative who has indicated his/her understanding and acceptance.     Dental  advisory given  Plan Discussed with: CRNA and Surgeon  Anesthesia Plan Comments:        Anesthesia Quick Evaluation

## 2024-05-02 ENCOUNTER — Encounter (HOSPITAL_COMMUNITY)
Admission: RE | Disposition: A | Discharge status: Home / Self Care | Payer: Self-pay | Source: Home / Self Care | Attending: Gastroenterology

## 2024-05-02 ENCOUNTER — Ambulatory Visit (HOSPITAL_COMMUNITY)
Admission: RE | Admit: 2024-05-02 | Discharge: 2024-05-02 | Disposition: A | Discharge status: Home / Self Care | Attending: Gastroenterology | Admitting: Gastroenterology

## 2024-05-02 ENCOUNTER — Other Ambulatory Visit: Payer: Self-pay

## 2024-05-02 ENCOUNTER — Ambulatory Visit (HOSPITAL_COMMUNITY): Payer: Self-pay | Admitting: Anesthesiology

## 2024-05-02 ENCOUNTER — Encounter (HOSPITAL_COMMUNITY): Payer: Self-pay | Admitting: Gastroenterology

## 2024-05-02 DIAGNOSIS — K31819 Angiodysplasia of stomach and duodenum without bleeding: Secondary | ICD-10-CM

## 2024-05-02 DIAGNOSIS — K449 Diaphragmatic hernia without obstruction or gangrene: Secondary | ICD-10-CM | POA: Insufficient documentation

## 2024-05-02 DIAGNOSIS — K3189 Other diseases of stomach and duodenum: Secondary | ICD-10-CM

## 2024-05-02 DIAGNOSIS — Z7984 Long term (current) use of oral hypoglycemic drugs: Secondary | ICD-10-CM | POA: Insufficient documentation

## 2024-05-02 DIAGNOSIS — E119 Type 2 diabetes mellitus without complications: Secondary | ICD-10-CM | POA: Insufficient documentation

## 2024-05-02 DIAGNOSIS — K2270 Barrett's esophagus without dysplasia: Secondary | ICD-10-CM | POA: Insufficient documentation

## 2024-05-02 DIAGNOSIS — K641 Second degree hemorrhoids: Secondary | ICD-10-CM | POA: Insufficient documentation

## 2024-05-02 DIAGNOSIS — I10 Essential (primary) hypertension: Secondary | ICD-10-CM | POA: Diagnosis not present

## 2024-05-02 DIAGNOSIS — K2289 Other specified disease of esophagus: Secondary | ICD-10-CM | POA: Diagnosis not present

## 2024-05-02 DIAGNOSIS — Q438 Other specified congenital malformations of intestine: Secondary | ICD-10-CM | POA: Diagnosis not present

## 2024-05-02 DIAGNOSIS — I2510 Atherosclerotic heart disease of native coronary artery without angina pectoris: Secondary | ICD-10-CM

## 2024-05-02 DIAGNOSIS — K2970 Gastritis, unspecified, without bleeding: Secondary | ICD-10-CM

## 2024-05-02 DIAGNOSIS — K5730 Diverticulosis of large intestine without perforation or abscess without bleeding: Secondary | ICD-10-CM | POA: Diagnosis not present

## 2024-05-02 DIAGNOSIS — G4730 Sleep apnea, unspecified: Secondary | ICD-10-CM | POA: Insufficient documentation

## 2024-05-02 DIAGNOSIS — Z7985 Long-term (current) use of injectable non-insulin antidiabetic drugs: Secondary | ICD-10-CM | POA: Diagnosis not present

## 2024-05-02 DIAGNOSIS — K562 Volvulus: Secondary | ICD-10-CM | POA: Diagnosis not present

## 2024-05-02 DIAGNOSIS — R8569 Abnormal cytological findings in specimens from other digestive organs and abdominal cavity: Secondary | ICD-10-CM | POA: Diagnosis not present

## 2024-05-02 DIAGNOSIS — Z87891 Personal history of nicotine dependence: Secondary | ICD-10-CM | POA: Insufficient documentation

## 2024-05-02 DIAGNOSIS — J449 Chronic obstructive pulmonary disease, unspecified: Secondary | ICD-10-CM | POA: Diagnosis not present

## 2024-05-02 DIAGNOSIS — K219 Gastro-esophageal reflux disease without esophagitis: Secondary | ICD-10-CM | POA: Diagnosis not present

## 2024-05-02 DIAGNOSIS — Z1211 Encounter for screening for malignant neoplasm of colon: Secondary | ICD-10-CM | POA: Diagnosis not present

## 2024-05-02 DIAGNOSIS — I899 Noninfective disorder of lymphatic vessels and lymph nodes, unspecified: Secondary | ICD-10-CM | POA: Diagnosis not present

## 2024-05-02 DIAGNOSIS — J439 Emphysema, unspecified: Secondary | ICD-10-CM | POA: Insufficient documentation

## 2024-05-02 DIAGNOSIS — Q439 Congenital malformation of intestine, unspecified: Secondary | ICD-10-CM

## 2024-05-02 DIAGNOSIS — K2950 Unspecified chronic gastritis without bleeding: Secondary | ICD-10-CM | POA: Diagnosis not present

## 2024-05-02 HISTORY — PX: ESOPHAGOGASTRODUODENOSCOPY: SHX5428

## 2024-05-02 HISTORY — PX: FINE NEEDLE ASPIRATION BIOPSY: CATH118315

## 2024-05-02 HISTORY — PX: COLONOSCOPY: SHX5424

## 2024-05-02 HISTORY — PX: SUBMUCOSAL INJECTION: SHX5543

## 2024-05-02 HISTORY — PX: EUS: SHX5427

## 2024-05-02 LAB — GLUCOSE, CAPILLARY: Glucose-Capillary: 100 mg/dL — ABNORMAL HIGH (ref 70–99)

## 2024-05-02 SURGERY — ULTRASOUND, UPPER GI TRACT, ENDOSCOPIC
Anesthesia: Monitor Anesthesia Care

## 2024-05-02 MED ORDER — SPOT INK MARKER SYRINGE KIT
PACK | SUBMUCOSAL | Status: DC | PRN
  Administered 2024-05-02: 1.5 mL via SUBMUCOSAL

## 2024-05-02 MED ORDER — PROPOFOL 500 MG/50ML IV EMUL
INTRAVENOUS | Status: DC | PRN
  Administered 2024-05-02: 200 ug/kg/min via INTRAVENOUS
  Administered 2024-05-02: 60 mg via INTRAVENOUS
  Administered 2024-05-02: 150 ug/kg/min via INTRAVENOUS

## 2024-05-02 MED ORDER — ONDANSETRON HCL 4 MG/2ML IJ SOLN
INTRAMUSCULAR | Status: DC | PRN
  Administered 2024-05-02: 4 mg via INTRAVENOUS

## 2024-05-02 MED ORDER — PROPOFOL 500 MG/50ML IV EMUL
INTRAVENOUS | Status: AC
Start: 2024-05-02 — End: 2024-05-02
  Filled 2024-05-02: qty 50

## 2024-05-02 MED ORDER — PROPOFOL 1000 MG/100ML IV EMUL
INTRAVENOUS | Status: AC
  Filled 2024-05-02: qty 100

## 2024-05-02 MED ORDER — SODIUM CHLORIDE 0.9 % IV SOLN: INTRAVENOUS | Status: DC

## 2024-05-02 MED ORDER — LIDOCAINE 2% (20 MG/ML) 5 ML SYRINGE
INTRAMUSCULAR | Status: DC | PRN
  Administered 2024-05-02: 100 mg via INTRAVENOUS

## 2024-05-02 MED ORDER — PROPOFOL 500 MG/50ML IV EMUL
INTRAVENOUS | Status: AC
  Filled 2024-05-02: qty 50

## 2024-05-02 MED ORDER — LACTATED RINGERS IV SOLN: INTRAVENOUS | Status: DC | PRN

## 2024-05-02 MED ORDER — GLYCOPYRROLATE 0.2 MG/ML IJ SOLN
INTRAMUSCULAR | Status: DC | PRN
  Administered 2024-05-02: .1 mg via INTRAVENOUS

## 2024-05-02 MED ORDER — STERILE WATER FOR IRRIGATION IR SOLN
Status: DC | PRN
  Administered 2024-05-02: 20 mL
  Administered 2024-05-02: 80 mL
  Administered 2024-05-02: 20 mL

## 2024-05-02 MED ORDER — PROPOFOL 10 MG/ML IV BOLUS
INTRAVENOUS | Status: AC
  Filled 2024-05-02: qty 20

## 2024-05-02 NOTE — Op Note (Signed)
 [facility] Patient Name: Marc Gates Procedure Date: 05/02/2024 MRN: [id number] Attending MD: Aloha Finner , MD, [id number] Date of Birth: 11-Sep-1951 CSN: [id number] Age: 73 Admit Type: Outpatient Procedure:                Upper EUS Indications:              Gastric deformity on endoscopy/Subepithelial tumor                            versus extrinsic compression Providers:                Aloha Finner, MD, Ozell Pouch, Lorrayne Kitty, Technician Referring MD:              Medicines:                Monitored Anesthesia Care Complications:            No immediate complications. Estimated Blood Loss:     Estimated blood loss was minimal. Procedure:                Pre-Anesthesia Assessment:                           - Prior to the procedure, a History and Physical                            was performed, and patient medications and                            allergies were reviewed. The patient's tolerance of                            previous anesthesia was also reviewed. The risks                            and benefits of the procedure and the sedation                            options and risks were discussed with the patient.                            All questions were answered, and informed consent                            was obtained. Prior Anticoagulants: The patient has                            taken no anticoagulant or antiplatelet agents                            except for aspirin . ASA Grade Assessment: III - A                            patient with  severe systemic disease. After                            reviewing the risks and benefits, the patient was                            deemed in satisfactory condition to undergo the                            procedure.                           After obtaining informed consent, the endoscope was                            passed under direct vision. Throughout  the                            procedure, the patient's blood pressure, pulse, and                            oxygen saturations were monitored continuously. The                            [id number]-H190 ([id number]) Olympus endoscope was introduced                            through the mouth, and advanced to the second part                            of duodenum. The GF-UE190-AL5 ([id number]) Olympus                            radial ultrasound scope was introduced through the                            mouth, and advanced to the duodenum for ultrasound                            examination from the stomach and duodenum. The                            [id number]-[id number] ([id number]) Olympus Forward View EUS was                            introduced through the mouth, and advanced to the                            duodenum for ultrasound examination from the                            stomach and duodenum. The GF-[id number] ([id number])  Olympus linear ultrasound scope was introduced                            through the mouth, and advanced to the duodenum for                            ultrasound examination from the stomach and                            duodenum. The upper EUS was accomplished without                            difficulty. The patient tolerated the procedure. Scope In: Scope Out: Findings:      ENDOSCOPIC FINDING: :      No gross lesions were noted in the proximal esophagus and in the mid       esophagus.      There were esophageal mucosal changes consistent with previously       established short-segment Barrett's esophagus present in the distal       esophagus. The maximum longitudinal extent of these mucosal changes was       3 cm in length.      The Z-line was irregular and was found 36 cm from the incisors.      A 3 cm hiatal hernia was present.      A single small angioectasia with no bleeding was found in the cardia.      A single 18 mm subepithelial papule  (nodule) with no bleeding and no       stigmata of recent bleeding was found on the greater curvature of the       gastric antrum. After the rest of the EUS was completed, a tattoo was       placed adjacent to this lesion.      Patchy mildly erythematous mucosa without bleeding was found in the       entire examined stomach. Biopsies were taken with a cold forceps for       histology and Helicobacter pylori testing.      No gross lesions were noted in the duodenal bulb, in the first portion       of the duodenum and in the second portion of the duodenum.      ENDOSONOGRAPHIC FINDING: :      A lobulated intramural (subepithelial) lesion was found in the antrum of       the stomach. The lesion was hypoechoic and calcified. Sonographically,       the lesion appeared to originate from the muscularis propria (Layer 4).       The lesion measured 18 mm (in maximum thickness). The lesion also       measured 13 mm in diameter. The outer endosonographic borders were well       defined. Fine needle biopsy was performed. Color Doppler imaging was       utilized prior to needle puncture to confirm a lack of significant       vascular structures within the needle path. Seven passes were made with       the 22 gauge Acquire-S biopsy needle using a transgastric approach. A       visible core of tissue was obtained. Preliminary cytologic examination  and touch preps were performed. Final cytology results are pending.      Endosonographic images of the rest of the visualized stomach were       unremarkable. No masses, no cysts and no wall thickening were identified.      Endosonographic imaging in the visualized portion of the liver showed no       mass.      There was no sign of significant endosonographic abnormality in the       gallbladder.      No malignant-appearing lymph nodes were visualized in the left gastric       region (level 17), gastrohepatic ligament (level 18) and celiac region        (level 20).      The celiac region was visualized. Impression:               EGD impression:                           - No gross lesions in the proximal esophagus and in                            the mid esophagus.                           - Esophageal mucosal changes consistent with                            short-segment Barrett's esophagus found distally.                           - Z-line irregular, 36 cm from the incisors.                           - 3 cm hiatal hernia.                           - A single non-bleeding angioectasia in the cardia                            of the stomach.                           - A single subepithelial papule (nodule) found in                            the antrum of the stomach. Tattoo placed adjacent.                           - Erythematous mucosa in the stomach. Biopsied.                           - No gross lesions in the duodenal bulb, in the                            first portion of the duodenum and in the second  portion of the duodenum.                           EUS impression:                           - An intramural (subepithelial) lesion was found in                            the antrum of the stomach. The lesion appeared to                            originate from within the muscularis propria (Layer                            4). Cytology results are pending. 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 Fine needle                            biopsy performed.                           - The rest of the visualized stomach lining was                            unremarkable on endoechosonography.                           - Gallbladder unremarkable (adjacent to the                            subepithelial lesion in the antrum but clearly not                            involving).                            - No malignant-appearing lymph nodes were                            visualized in the left gastric region (level 17),                            gastrohepatic ligament (level 18) and celiac region                            (level 20). Moderate Sedation:      Not Applicable - Patient had care per Anesthesia. Recommendation:           - Proceed to scheduled colonoscopy.                           -  Observe patient's clinical course.                           - Continue present medications.                           - Follow-up pathology and cytology.                           - Repeat the upper endoscopic ultrasound will be                            considered based on the final pathology. Overall,                            relatively stable from last year to this year in                            regards to the lesion. If this turns out to be a                            GIST, will need monitoring, as it still remains                            under 2 cm which is the mark for gastric resection.                            If this is clearly a leiomyoma, additional                            surveillance may not be required, but could be                            considered. If this returns as inflammatory with                            continued atypical findings, will discuss case at                            [facility] and likely present case to foregut surgery to                            consider if there is a role of surgery versus                            continued surveillance.                           - The findings and recommendations were discussed                            with the patient.                           -  The findings and recommendations were discussed                            with the patient's family. Procedure Code(s):        --- Professional ---                           [phone], Esophagogastroduodenoscopy, flexible,                             transoral; with biopsy, single or multiple Diagnosis Code(s):        --- Professional ---                           K22.89, Other specified disease of esophagus                           K44.9, Diaphragmatic hernia without obstruction or                            gangrene                           K31.89, Other diseases of stomach and duodenum CPT copyright 2022 American [employer] Association. All rights reserved. The codes documented in this report are preliminary and upon coder review may  be revised to meet current compliance requirements. Aloha Finner, MD 05/02/2024 10:00:15 AM Number of Addenda: 0

## 2024-05-02 NOTE — Anesthesia Postprocedure Evaluation (Signed)
 Anesthesia Post Note  Patient: Marc Gates  Procedure(s) Performed: ULTRASOUND, UPPER GI TRACT, ENDOSCOPIC COLONOSCOPY EGD (ESOPHAGOGASTRODUODENOSCOPY) FINE NEEDLE ASPIRATION BIOPSY INJECTION, SUBMUCOSAL     Patient location during evaluation: Endoscopy Anesthesia Type: MAC Level of consciousness: oriented, patient cooperative and awake and alert Pain management: pain level controlled Vital Signs Assessment: post-procedure vital signs reviewed and stable Respiratory status: spontaneous breathing, nonlabored ventilation and respiratory function stable Cardiovascular status: blood pressure returned to baseline and stable Postop Assessment: no apparent nausea or vomiting and able to ambulate Anesthetic complications: no  No notable events documented.  Last Vitals:  Vitals:   05/02/24 0650 05/02/24 0954  BP: (!) 144/71 138/77  Pulse: (!) 58 (!) 54  Resp: 19 20  Temp: (!) 36.4 C (!) 36.3 C  SpO2: 95% 97%    Last Pain:  Vitals:   05/02/24 0954  TempSrc: Temporal  PainSc: 0-No pain                 [name],E. Janiesha Diehl

## 2024-05-02 NOTE — Anesthesia Procedure Notes (Signed)
 Procedure Name: MAC Date/Time: 05/02/2024 8:05 AM  Performed by: Hanley Delon POUR, CRNAPre-anesthesia Checklist: Patient identified, Emergency Drugs available, Suction available, Patient being monitored and Timeout performed Oxygen Delivery Method: Simple face mask

## 2024-05-02 NOTE — H&P (Signed)
 GASTROENTEROLOGY PROCEDURE H&P NOTE   Primary Care Physician: Dwight Trula SQUIBB, MD  HPI: Marc Gates is a 73 y.o. male who presents for Upper EUS/Colonoscopy for followup of gastric SEL and Colon polyp surveillance.  Past Medical History:  Diagnosis Date   Allergies    Anxiety    CPAP (continuous positive airway pressure) dependence    Diabetes mellitus without complication (HCC)    Edema of both lower extremities    Empyema (HCC)    GERD (gastroesophageal reflux disease)    Hepatitis 1980'S   HEPATITIS B   High cholesterol    Hypertension    Joint pain    Pneumonia    Sleep apnea    SOB (shortness of breath)    Past Surgical History:  Procedure Laterality Date   APPENDECTOMY  AGE 47 OR 13   BIOPSY  07/31/2023   Procedure: BIOPSY;  Surgeon: Wilhelmenia Aloha Raddle., MD;  Location: [facility];  Service: Gastroenterology;;   COLONOSCOPY WITH PROPOFOL  N/A 08/13/2013   Procedure: COLONOSCOPY WITH PROPOFOL ;  Surgeon: Gladis MARLA Louder, MD;  Location: [facility];  Service: Endoscopy;  Laterality: N/A;   DECORTICATION Right 04/03/2019   Procedure: DECORTICATION OF RIGHT LUNG;  Surgeon: Lucas Dorise MARLA, MD;  Location: [facility];  Service: Thoracic;  Laterality: Right;   EMPYEMA DRAINAGE Right 04/03/2019   Procedure: EMPYEMA DRAINAGE;  Surgeon: Lucas Dorise MARLA, MD;  Location: [facility];  Service: Thoracic;  Laterality: Right;   ESOPHAGOGASTRODUODENOSCOPY N/A 07/31/2023   Procedure: ESOPHAGOGASTRODUODENOSCOPY (EGD);  Surgeon: Wilhelmenia Aloha Raddle., MD;  Location: [facility];  Service: Gastroenterology;  Laterality: N/A;   EUS N/A 07/31/2023   Procedure: UPPER ENDOSCOPIC ULTRASOUND (EUS) RADIAL;  Surgeon: Wilhelmenia Aloha Raddle., MD;  Location: [facility];  Service: Gastroenterology;  Laterality: N/A;   EYE SURGERY Left    retinal detachment and cataract surgery   FINE NEEDLE ASPIRATION N/A 07/31/2023   Procedure: FINE NEEDLE ASPIRATION (FNA) LINEAR;  Surgeon: Wilhelmenia Aloha Raddle.,  MD;  Location: [facility];  Service: Gastroenterology;  Laterality: N/A;   HERNIA REPAIR  62 WEEKS OLD   IR THORACENTESIS ASP PLEURAL SPACE W/IMG GUIDE  03/07/2019   LEFT HEART CATH AND CORONARY ANGIOGRAPHY N/A 04/06/2021   Procedure: LEFT HEART CATH AND CORONARY ANGIOGRAPHY;  Surgeon: [place], Peter M, MD;  [facility] INVAS[facility] CV [facility];  Service: Cardiovascular;  Laterality: N/A;   PAIN PUMP IMPLANTATION  04/03/2019   Procedure: PLACEMENT OF ON-Q PAIN PUMP;  Surgeon: Lucas Dorise MARLA, MD;  Location: [facility];  Service: Thoracic;;   THORACOTOMY Right 04/03/2019   Procedure: THORACOTOMY MAJOR;  Surgeon: Lucas Dorise MARLA, MD;  Location: [facility];  Service: Thoracic;  Laterality: Right;   Current Facility-Administered Medications  Medication Dose Route Frequency Provider Last Rate Last Admin   0.9 %  sodium chloride  infusion   Intravenous Continuous Mansouraty, Aloha Raddle., MD        Current Facility-Administered Medications:    0.9 %  sodium chloride  infusion, , Intravenous, Continuous, Mansouraty, Aloha Raddle., MD No Known Allergies Family History  Problem Relation Age of Onset   High blood pressure Mother    High Cholesterol Mother    Heart disease Mother    Anxiety disorder Mother    Obesity Mother    Diabetes Father    High Cholesterol Father    Cancer Father    Seizures Brother    Colon cancer Neg Hx    Esophageal cancer Neg Hx    Stomach cancer Neg Hx  Social History   Socioeconomic History   Marital status: Single    Spouse name: Not on file   Number of children: 0   Years of education: Not on file   Highest education level: Not on file  Occupational History   Occupation: Retired Audiological scientist Professor  Tobacco Use   Smoking status: Former    Current packs/day: 1.50    Average packs/day: 1.5 packs/day for 15.0 years (22.5 ttl pk-yrs)    Types: Cigarettes    Passive exposure: Past   Smokeless tobacco: Never  Vaping Use   Vaping status: Never Used  Substance and Sexual  Activity   Alcohol use: Yes    Comment: rare   Drug use: No    Comment: QUIT SMOKING 25 YRS AGO   Sexual activity: Not on file  Other Topics Concern   Not on file  Social History Narrative   Not on file   Social Drivers of Health   Financial Resource Strain: Not on file  Food Insecurity: Low Risk  (12/16/2023)   Received from [facility]   Hunger Vital Sign    Within the past 12 months, you worried that your food would run out before you got money to buy more: Never true    Within the past 12 months, the food you bought just didn't last and you didn't have money to get more. : Never true  Transportation Needs: No Transportation Needs (12/16/2023)   Received from [facility]    In the past 12 months, has lack of reliable transportation kept you from medical appointments, meetings, work or from getting things needed for daily living? : No  Physical Activity: Not on file  Stress: Not on file  Social Connections: Not on file  Intimate Partner Violence: Not on file    Physical Exam: Today's Vitals   05/01/24 1157 05/02/24 0650  BP:  (!) 144/71  Pulse:  (!) 58  Resp:  19  Temp:  (!) 97.5 F (36.4 C)  TempSrc:  Temporal  SpO2:  95%  Weight: 110.2 kg 110.2 kg  Height: 5' 10 (1.778 m) 5' 10 (1.778 m)  PainSc:  0-No pain   Body mass index is 34.86 kg/m. GEN: NAD EYE: Sclerae anicteric ENT: MMM CV: Non-tachycardic GI: Soft, NT/ND NEURO:  Alert & Oriented x 3  Lab Results: No results for input(s): WBC, HGB, HCT, PLT in the last 72 hours. BMET No results for input(s): NA, K, CL, CO2, GLUCOSE, BUN, CREATININE, CALCIUM  in the last 72 hours. LFT No results for input(s): PROT, ALBUMIN, AST, ALT, ALKPHOS, BILITOT, BILIDIR, IBILI in the last 72 hours. PT/INR No results for input(s): LABPROT, INR in the last 72 hours.   Impression / Plan: This is a 73 y.o.male who presents for Upper EUS/Colonoscopy for followup  of gastric SEL and Colon polyp surveillance.  The risks of an EUS including intestinal perforation, bleeding, infection, aspiration, and medication effects were discussed as was the possibility it may not give a definitive diagnosis if a biopsy is performed.  When a biopsy of the pancreas is done as part of the EUS, there is an additional risk of pancreatitis at the rate of about 1-2%.  It was explained that procedure related pancreatitis is typically mild, although it can be severe and even life threatening, which is why we do not perform random pancreatic biopsies and only biopsy a lesion/area we feel is concerning enough to warrant the risk.   The risks and benefits  of endoscopic evaluation/treatment were discussed with the patient and/or family; these include but are not limited to the risk of perforation, infection, bleeding, missed lesions, lack of diagnosis, severe illness requiring hospitalization, as well as anesthesia and sedation related illnesses.  The patient's history has been reviewed, patient examined, no change in status, and deemed stable for procedure.  The patient and/or family is agreeable to proceed.    Aloha Finner, MD [facility]oscopy Office # [phone]

## 2024-05-02 NOTE — Discharge Instructions (Signed)

## 2024-05-02 NOTE — Transfer of Care (Signed)
 Immediate Anesthesia Transfer of Care Note  Patient: TAKAO LIZER  Procedure(s) Performed: ULTRASOUND, UPPER GI TRACT, ENDOSCOPIC COLONOSCOPY EGD (ESOPHAGOGASTRODUODENOSCOPY) FINE NEEDLE ASPIRATION BIOPSY INJECTION, SUBMUCOSAL  Patient Location: PACU  Anesthesia Type:MAC  Level of Consciousness: oriented, drowsy, and patient cooperative  Airway & Oxygen Therapy: Patient Spontanous Breathing and Patient connected to face mask oxygen  Post-op Assessment: Report given to RN and Post -op Vital signs reviewed and stable  Post vital signs: Reviewed  Last Vitals:  Vitals Value Taken Time  BP    Temp    Pulse 55 05/02/24 09:53  Resp 20 05/02/24 09:53  SpO2 97 % 05/02/24 09:53  Vitals shown include unfiled device data.  Last Pain:  Vitals:   05/02/24 0650  TempSrc: Temporal  PainSc: 0-No pain         Complications: No notable events documented.

## 2024-05-02 NOTE — Op Note (Signed)
 [facility] Patient Name: Marc Gates Procedure Date: 05/02/2024 MRN: [id number] Attending MD: Aloha Finner , MD, [id number] Date of Birth: [birth year] CSN: [id number] Age: 73 Admit Type: Outpatient Procedure:                Colonoscopy Indications:              Screening for colorectal malignant neoplasm Providers:                Aloha Finner, MD, Ozell Pouch, Lorrayne Kitty, Technician Referring MD:              Medicines:                Monitored Anesthesia Care Complications:            No immediate complications. Estimated Blood Loss:     Estimated blood loss: none. Procedure:                Pre-Anesthesia Assessment:                           - Prior to the procedure, a History and Physical                            was performed, and patient medications and                            allergies were reviewed. The patient's tolerance of                            previous anesthesia was also reviewed. The risks                            and benefits of the procedure and the sedation                            options and risks were discussed with the patient.                            All questions were answered, and informed consent                            was obtained. Prior Anticoagulants: The patient has                            taken no anticoagulant or antiplatelet agents                            except for aspirin . ASA Grade Assessment: III - A                            patient with severe systemic disease. After                            reviewing  the risks and benefits, the patient was                            deemed in satisfactory condition to undergo the                            procedure.                           After obtaining informed consent, the colonoscope                            was passed under direct vision. Throughout the                            procedure, the patient's blood  pressure, pulse, and                            oxygen saturations were monitored continuously. The                            CF-HQ190L ([id number]) Olympus colonoscope was                            introduced through the anus and advanced to the the                            cecum, identified by appendiceal orifice and                            ileocecal valve. The colonoscopy was somewhat                            difficult due to restricted mobility of the colon,                            a redundant colon, significant looping and a                            tortuous colon. Successful completion of the                            procedure was aided by changing the patient to a                            supine position, withdrawing and reinserting the                            scope, straightening and shortening the scope to                            obtain bowel loop reduction and using scope  torsion. The patient tolerated the procedure. The                            quality of the bowel preparation was adequate. The                            ileocecal valve, appendiceal orifice, and rectum                            were photographed. Scope In: 9:17:09 AM Scope Out: 9:44:04 AM Scope Withdrawal Time: 0 hours 13 minutes 24 seconds  Total Procedure Duration: 0 hours 26 minutes 55 seconds  Findings:      The digital rectal exam findings include hemorrhoids. Pertinent       negatives include no palpable rectal lesions.      A large amount of semi-liquid stool was found in the entire colon,       precluding visualization. Lavage of the area was performed using copious       amounts, resulting in clearance with adequate visualization.      The colon (entire examined portion) revealed grossly excessive looping.      The left colon was significantly tortuous.      Many medium-mouthed and small-mouthed diverticula were found in the left       colon.       Normal mucosa was found in the entire colon otherwise.      Non-bleeding non-thrombosed internal hemorrhoids were found during       retroflexion, during perianal exam and during digital exam. The       hemorrhoids were Grade II (internal hemorrhoids that prolapse but reduce       spontaneously). Impression:               - Hemorrhoids found on digital rectal exam.                           - Stool in the entire examined colon. Lavaged                            copiously with adequate visualization.                           - There was significant looping of the colon as a                            result of redundancy.                           - Tortuous left colon.                           - Diverticulosis in the left colon.                           - Normal mucosa in the entire examined colon                            otherwise.                           -  Non-bleeding non-thrombosed internal hemorrhoids. Moderate Sedation:      Not Applicable - Patient had care per Anesthesia. Recommendation:           - The patient will be observed post-procedure,                            until all discharge criteria are met.                           - Discharge patient to home.                           - Patient has a contact number available for                            emergencies. The signs and symptoms of potential                            delayed complications were discussed with the                            patient. Return to normal activities tomorrow.                            Written discharge instructions were provided to the                            patient.                           - High fiber diet.                           - Use FiberCon 1-2 tablets PO daily.                           - Continue present medications.                           - Repeat colonoscopy in 5-10 years for screening                            purposes with 2-day preparation and abdominal                             binder and with CO2 insufflation available                            (hospital-based most likely).                           - The findings and recommendations were discussed                            with the patient.                           -  The findings and recommendations were discussed                            with the patient's family. Procedure Code(s):        --- Professional ---                           H9878, Colorectal cancer screening; colonoscopy on                            individual not meeting criteria for high risk Diagnosis Code(s):        --- Professional ---                           Z12.11, Encounter for screening for malignant                            neoplasm of colon                           K64.1, Second degree hemorrhoids                           K57.30, Diverticulosis of large intestine without                            perforation or abscess without bleeding                           Q43.8, Other specified congenital malformations of                            intestine CPT copyright 2022 American Medical Association. All rights reserved. The codes documented in this report are preliminary and upon coder review may  be revised to meet current compliance requirements. Aloha Finner, MD 05/02/2024 10:07:01 AM Number of Addenda: 0

## 2024-05-03 ENCOUNTER — Ambulatory Visit: Payer: Self-pay | Admitting: Gastroenterology

## 2024-05-03 LAB — SURGICAL PATHOLOGY

## 2024-05-04 ENCOUNTER — Encounter (HOSPITAL_COMMUNITY): Payer: Self-pay | Admitting: Gastroenterology

## 2024-05-06 DIAGNOSIS — D72829 Elevated white blood cell count, unspecified: Secondary | ICD-10-CM | POA: Diagnosis not present

## 2024-05-06 DIAGNOSIS — J3081 Allergic rhinitis due to animal (cat) (dog) hair and dander: Secondary | ICD-10-CM | POA: Diagnosis not present

## 2024-05-06 DIAGNOSIS — J3089 Other allergic rhinitis: Secondary | ICD-10-CM | POA: Diagnosis not present

## 2024-05-06 DIAGNOSIS — J301 Allergic rhinitis due to pollen: Secondary | ICD-10-CM | POA: Diagnosis not present

## 2024-05-13 DIAGNOSIS — J301 Allergic rhinitis due to pollen: Secondary | ICD-10-CM | POA: Diagnosis not present

## 2024-05-13 LAB — CYTOLOGY - NON PAP

## 2024-05-22 ENCOUNTER — Other Ambulatory Visit: Payer: Self-pay

## 2024-05-22 NOTE — Progress Notes (Signed)
 The proposed treatment discussed in conference is for discussion purpose only and is not a binding recommendation.  The patients have not been physically examined, or presented with their treatment options.  Therefore, final treatment plans cannot be decided.

## 2024-05-27 DIAGNOSIS — J301 Allergic rhinitis due to pollen: Secondary | ICD-10-CM | POA: Diagnosis not present

## 2024-05-27 DIAGNOSIS — J3081 Allergic rhinitis due to animal (cat) (dog) hair and dander: Secondary | ICD-10-CM | POA: Diagnosis not present

## 2024-05-27 DIAGNOSIS — J3089 Other allergic rhinitis: Secondary | ICD-10-CM | POA: Diagnosis not present

## 2024-06-03 DIAGNOSIS — J301 Allergic rhinitis due to pollen: Secondary | ICD-10-CM | POA: Diagnosis not present

## 2024-06-03 DIAGNOSIS — J3081 Allergic rhinitis due to animal (cat) (dog) hair and dander: Secondary | ICD-10-CM | POA: Diagnosis not present

## 2024-06-03 DIAGNOSIS — J3089 Other allergic rhinitis: Secondary | ICD-10-CM | POA: Diagnosis not present

## 2024-06-05 DIAGNOSIS — B351 Tinea unguium: Secondary | ICD-10-CM | POA: Diagnosis not present

## 2024-06-05 DIAGNOSIS — I70203 Unspecified atherosclerosis of native arteries of extremities, bilateral legs: Secondary | ICD-10-CM | POA: Diagnosis not present

## 2024-06-05 DIAGNOSIS — E1142 Type 2 diabetes mellitus with diabetic polyneuropathy: Secondary | ICD-10-CM | POA: Diagnosis not present

## 2024-06-05 DIAGNOSIS — E1151 Type 2 diabetes mellitus with diabetic peripheral angiopathy without gangrene: Secondary | ICD-10-CM | POA: Diagnosis not present

## 2024-06-05 DIAGNOSIS — M792 Neuralgia and neuritis, unspecified: Secondary | ICD-10-CM | POA: Diagnosis not present

## 2024-06-05 DIAGNOSIS — L851 Acquired keratosis [keratoderma] palmaris et plantaris: Secondary | ICD-10-CM | POA: Diagnosis not present

## 2024-06-05 DIAGNOSIS — L603 Nail dystrophy: Secondary | ICD-10-CM | POA: Diagnosis not present

## 2024-06-09 ENCOUNTER — Encounter: Payer: Self-pay | Admitting: Gastroenterology

## 2024-06-09 NOTE — Progress Notes (Signed)
 This patient's case was discussed at multidisciplinary conference. Consensus from the team was to repeat endoscopic ultrasound at 6 months versus referral to foregut surgery for consideration of partial resection/gastrectomy.  I have discussed this with the patient.  Patient is amenable to surgical consultation and we will place a 32-month ultrasound surveillance recall into the system.  If after surgical consultation patient agrees to surgery upfront without continued surveillance, I am okay with that to in the setting of this epithelioid small muscle cell lesion/tumor.  Aloha Finner, MD [facility] Advanced [facility]oscopy Office # [phone]

## 2024-06-10 DIAGNOSIS — J3081 Allergic rhinitis due to animal (cat) (dog) hair and dander: Secondary | ICD-10-CM | POA: Diagnosis not present

## 2024-06-10 DIAGNOSIS — J3089 Other allergic rhinitis: Secondary | ICD-10-CM | POA: Diagnosis not present

## 2024-06-10 DIAGNOSIS — J301 Allergic rhinitis due to pollen: Secondary | ICD-10-CM | POA: Diagnosis not present

## 2024-06-10 NOTE — Progress Notes (Signed)
 EUS recall has been entered  Referral to [facility] has been faxed with records

## 2024-06-17 DIAGNOSIS — D131 Benign neoplasm of stomach: Secondary | ICD-10-CM | POA: Diagnosis not present

## 2024-06-17 DIAGNOSIS — J301 Allergic rhinitis due to pollen: Secondary | ICD-10-CM | POA: Diagnosis not present

## 2024-06-17 DIAGNOSIS — I872 Venous insufficiency (chronic) (peripheral): Secondary | ICD-10-CM | POA: Diagnosis not present

## 2024-06-17 DIAGNOSIS — H6121 Impacted cerumen, right ear: Secondary | ICD-10-CM | POA: Diagnosis not present

## 2024-06-17 DIAGNOSIS — M79604 Pain in right leg: Secondary | ICD-10-CM | POA: Diagnosis not present

## 2024-06-17 DIAGNOSIS — J3081 Allergic rhinitis due to animal (cat) (dog) hair and dander: Secondary | ICD-10-CM | POA: Diagnosis not present

## 2024-06-17 DIAGNOSIS — J3089 Other allergic rhinitis: Secondary | ICD-10-CM | POA: Diagnosis not present

## 2024-06-19 DIAGNOSIS — H6121 Impacted cerumen, right ear: Secondary | ICD-10-CM | POA: Diagnosis not present

## 2024-06-24 ENCOUNTER — Ambulatory Visit: Payer: Self-pay | Admitting: Surgery

## 2024-06-24 DIAGNOSIS — J3089 Other allergic rhinitis: Secondary | ICD-10-CM | POA: Diagnosis not present

## 2024-06-24 DIAGNOSIS — K3189 Other diseases of stomach and duodenum: Secondary | ICD-10-CM | POA: Diagnosis not present

## 2024-06-24 DIAGNOSIS — J301 Allergic rhinitis due to pollen: Secondary | ICD-10-CM | POA: Diagnosis not present

## 2024-06-24 DIAGNOSIS — M6208 Separation of muscle (nontraumatic), other site: Secondary | ICD-10-CM | POA: Diagnosis not present

## 2024-06-24 DIAGNOSIS — K429 Umbilical hernia without obstruction or gangrene: Secondary | ICD-10-CM | POA: Diagnosis not present

## 2024-06-24 DIAGNOSIS — J3081 Allergic rhinitis due to animal (cat) (dog) hair and dander: Secondary | ICD-10-CM | POA: Diagnosis not present

## 2024-07-09 DIAGNOSIS — M25562 Pain in left knee: Secondary | ICD-10-CM | POA: Diagnosis not present

## 2024-07-09 DIAGNOSIS — J3089 Other allergic rhinitis: Secondary | ICD-10-CM | POA: Diagnosis not present

## 2024-07-09 DIAGNOSIS — J3081 Allergic rhinitis due to animal (cat) (dog) hair and dander: Secondary | ICD-10-CM | POA: Diagnosis not present

## 2024-07-09 DIAGNOSIS — J301 Allergic rhinitis due to pollen: Secondary | ICD-10-CM | POA: Diagnosis not present

## 2024-07-09 DIAGNOSIS — M25561 Pain in right knee: Secondary | ICD-10-CM | POA: Diagnosis not present

## 2024-07-15 DIAGNOSIS — J3081 Allergic rhinitis due to animal (cat) (dog) hair and dander: Secondary | ICD-10-CM | POA: Diagnosis not present

## 2024-07-15 DIAGNOSIS — J3089 Other allergic rhinitis: Secondary | ICD-10-CM | POA: Diagnosis not present

## 2024-07-15 DIAGNOSIS — J301 Allergic rhinitis due to pollen: Secondary | ICD-10-CM | POA: Diagnosis not present

## 2024-07-22 DIAGNOSIS — J3089 Other allergic rhinitis: Secondary | ICD-10-CM | POA: Diagnosis not present

## 2024-07-22 DIAGNOSIS — J301 Allergic rhinitis due to pollen: Secondary | ICD-10-CM | POA: Diagnosis not present

## 2024-07-22 DIAGNOSIS — J3081 Allergic rhinitis due to animal (cat) (dog) hair and dander: Secondary | ICD-10-CM | POA: Diagnosis not present

## 2024-07-24 DIAGNOSIS — F419 Anxiety disorder, unspecified: Secondary | ICD-10-CM | POA: Diagnosis not present

## 2024-07-24 DIAGNOSIS — K3189 Other diseases of stomach and duodenum: Secondary | ICD-10-CM | POA: Diagnosis not present

## 2024-07-24 DIAGNOSIS — G4733 Obstructive sleep apnea (adult) (pediatric): Secondary | ICD-10-CM | POA: Diagnosis not present

## 2024-07-24 DIAGNOSIS — E1169 Type 2 diabetes mellitus with other specified complication: Secondary | ICD-10-CM | POA: Diagnosis not present

## 2024-07-24 DIAGNOSIS — I2510 Atherosclerotic heart disease of native coronary artery without angina pectoris: Secondary | ICD-10-CM | POA: Diagnosis not present

## 2024-07-24 DIAGNOSIS — E66812 Obesity, class 2: Secondary | ICD-10-CM | POA: Diagnosis not present

## 2024-07-24 DIAGNOSIS — I10 Essential (primary) hypertension: Secondary | ICD-10-CM | POA: Diagnosis not present

## 2024-07-24 NOTE — Patient Instructions (Signed)
 SURGICAL WAITING ROOM VISITATION Patients having surgery or a procedure may have no more than 2 support people in the waiting area - these visitors may rotate in the visitor waiting room.   If the patient needs to stay at the hospital during part of their recovery, the visitor guidelines for inpatient rooms apply.  PRE-OP VISITATION  Pre-op nurse will coordinate an appropriate time for 1 support person to accompany the patient in pre-op.  This support person may not rotate.  This visitor will be contacted when the time is appropriate for the visitor to come back in the pre-op area.  Please refer to the [employer] website for the visitor guidelines for Inpatients (after your surgery is over and you are in a regular room).  You are not required to quarantine at this time prior to your surgery. However, you must do this: Hand Hygiene often Do NOT share personal items Notify your provider if you are in close contact with someone who has COVID or you develop fever 100.4 or greater, new onset of sneezing, cough, sore throat, shortness of breath or body aches.  If you test positive for Covid or have been in contact with anyone that has tested positive in the last 10 days please notify you surgeon.    Your procedure is scheduled on:  Thursday 08-01-2024  Report to [facility] Main Entrance: [address] entrance where the [employer] is available.   Report to admitting at:  10:15   AM  Call this number if you have any questions or problems the morning of surgery [phone]  FOLLOW ANY ADDITIONAL PRE OP INSTRUCTIONS YOU RECEIVED FROM YOUR SURGEON'S OFFICE!!!  DRINK two (2) bottles of Pre-Surgery G2 drink starting at 6:00 pm the evening prior to your surgery to help prevent dehydration. Increase drinking clear fluids (see list below)          Do not eat food after Midnight the night prior to your surgery/procedure.  After Midnight you may have the following liquids until   09:30 AM DAY  OF SURGERY  Clear Liquid Diet Water  Black Coffee (sugar ok, NO MILK/CREAM OR CREAMERS)  Tea (sugar ok, NO MILK/CREAM OR CREAMERS) regular and decaf                             Plain Jell-O  with no fruit (NO RED)                                           Fruit ices (not with fruit pulp, NO RED)                                     Popsicles (NO RED)                                                                  Juice: NO CITRUS JUICES: only apple, WHITE grape, WHITE cranberry Sports drinks like Gatorade or Powerade (NO RED)  The day of surgery:  Drink ONE (1) Pre-Surgery G2 at  10:15 AM the morning of surgery. Drink in one sitting. Do not sip.  This drink was given to you during your hospital pre-op appointment visit. Nothing else to drink after completing the Pre-Surgery G2 : No candy, chewing gum or throat lozenges.     Oral Hygiene is also important to reduce your risk of infection.        Remember - BRUSH YOUR TEETH THE MORNING OF SURGERY WITH YOUR REGULAR TOOTHPASTE  Do NOT smoke after Midnight the night before surgery.  STOP TAKING all Vitamins, Herbs and supplements 1 week before your surgery.   OZEMPIC-  do not inject 8-14 days before surgery.  Last Injection will be on:  METFORMIN - Day BEFORE surgery, take as usual.                  DO NOT TAKE METFORMIN  THE DAY OF YOUR SURGERY.   Take ONLY these medicines the morning of surgery with A SIP OF WATER : Mirabegron , pantoprazole, famotidine , finasteride , and alprazolam  if needed. You may use Flonase  / Astelin nasal spray and your Albuterol inhaler if needed.   DO NOT TAKE Amlodipine  - valsartan (Exforge) the morning of your surgery.   If You have been diagnosed with Sleep Apnea - Bring CPAP mask and tubing day of surgery. We will provide you with a CPAP machine on the day of your surgery.                   You may not have any metal on your body including  jewelry, and body piercing  Do not wear lotions,  powders,  cologne, or deodorant  Men may shave face and neck.  Contacts, Hearing Aids, dentures or bridgework may not be worn into surgery. DENTURES WILL BE REMOVED PRIOR TO SURGERY PLEASE DO NOT APPLY Poly grip OR ADHESIVES!!!  You may bring a small overnight bag with you on the day of surgery, only pack items that are not valuable. [facility] IS NOT RESPONSIBLE   FOR VALUABLES THAT ARE LOST OR STOLEN.   Do not bring your home medications to the hospital. The Pharmacy will dispense medications listed on your medication list to you during your admission in the Hospital.  Please read over the following fact sheets you were given: IF YOU HAVE QUESTIONS ABOUT YOUR PRE-OP INSTRUCTIONS, PLEASE CALL [phone]   [facility] - Preparing for Surgery Before surgery, you can play an important role.  Because skin is not sterile, your skin needs to be as free of germs as possible.  You can reduce the number of germs on your skin by washing with CHG (chlorahexidine gluconate) soap before surgery.  CHG is an antiseptic cleaner which kills germs and bonds with the skin to continue killing germs even after washing. Please DO NOT use if you have an allergy to CHG or antibacterial soaps.  If your skin becomes reddened/irritated stop using the CHG and inform your nurse when you arrive at Short Stay. Do not shave (including legs and underarms) for at least 48 hours prior to the first CHG shower.  You may shave your face/neck.  Please follow these instructions carefully:  1.  Shower with CHG Soap the night before surgery and the  morning of surgery.  2.  If you choose to wash your hair, wash your hair first as usual with your normal  shampoo.  3.  After you shampoo, rinse your hair and  body thoroughly to remove the shampoo.                             4.  Use CHG as you would any other liquid soap.  You can apply chg directly to the skin and wash.  Gently with a scrungie or clean washcloth.  5.  Apply the CHG  Soap to your body ONLY FROM THE NECK DOWN.   Do not use on face/ open                           Wound or open sores. Avoid contact with eyes, ears mouth and genitals (private parts).                       Wash face,  Genitals (private parts) with your normal soap.             6.  Wash thoroughly, paying special attention to the area where your  surgery  will be performed.  7.  Thoroughly rinse your body with warm water  from the neck down.  8.  DO NOT shower/wash with your normal soap after using and rinsing off the CHG Soap.            9.  Pat yourself dry with a clean towel.            10.  Wear clean pajamas.            11.  Place clean sheets on your bed the night of your first shower and do not  sleep with pets.  ON THE DAY OF SURGERY : Do not apply any lotions/deodorants the morning of surgery.  Please wear clean clothes to the hospital/surgery center.     FAILURE TO FOLLOW THESE INSTRUCTIONS MAY RESULT IN THE CANCELLATION OF YOUR SURGERY  PATIENT SIGNATURE_________________________________  NURSE SIGNATURE__________________________________  ________________________________________________________________________

## 2024-07-24 NOTE — Progress Notes (Signed)
 COVID Vaccine received:  []  No [x]  Yes Date of any COVID positive Test in last 90 days:  PCP - Trula Brim, MD at [facility] - Darryle Decent, MD  Chest x-ray -  01-01-24 2v    EKG -  01-16-2023    will repeat Stress Test - myoview  03-04-2021 ECHO - 03-05-2021   Cardiac Cath - LHC/ Cors 04-06-2021 by Dr. [place] CT Coronary Calcium  score:  828 on 03-05-2021   Bowel Prep - [x]  No  []   Yes ______  Pacemaker / ICD device [x]  No []  Yes   Spinal Cord Stimulator:[x]  No []  Yes       History of Sleep Apnea? []  No [x]  Yes   CPAP used?- []  No [x]  Yes    Patient has: []  NO Hx DM   [x]  Pre-DM   []  DM1  []   DM2 Does the patient monitor blood sugar?   []  N/A   [x]  No []  Yes  Last A1c was: 6.2  on   03-22-24   Does patient have a [employer] or Dexcom? []  No []  Yes   Fasting Blood Sugar Ranges-  Checks Blood Sugar _____ times a day  OZEMPIC- Hold  METFORMIN - Hold DOS  Blood Thinner / Instructions:  none Aspirin  Instructions:  ASA 81mg  - continue per Dr. Sheldon' surgery orders  Dental hx: []  Dentures:  []  N/A      []  Bridge or Partial:                   []  Loose or Damaged teeth:   Comments:   Activity level: Able to walk up 2 flights of stairs without becoming significantly short of breath or having chest pain?  []  No   []    Yes  Patient can perform ADLs without assistance. []  No   []   Yes  Anesthesia review: CAD, HTN, Pre-DM, Hx major thoracotomy for empyema drainage 04-03-2019, Pulmonary fibrosis, GERD, OSA-CPAP, Hx Hep B, anxiety  Patient denies any S&S of respiratory illness or Covid - no shortness of breath, fever, cough or chest pain at PAT appointment.  Patient verbalized understanding and agreement to the Pre-Surgical Instructions that were given to them at this PAT appointment. Patient was also educated of the need to review these PAT instructions again prior to his surgery.I reviewed the appropriate phone numbers to call if they have any and questions or concerns.

## 2024-07-25 ENCOUNTER — Encounter (HOSPITAL_COMMUNITY): Payer: Self-pay

## 2024-07-25 ENCOUNTER — Encounter (HOSPITAL_COMMUNITY)
Admission: RE | Admit: 2024-07-25 | Discharge: 2024-07-25 | Disposition: A | Discharge status: Home / Self Care | Source: Ambulatory Visit | Attending: Surgery | Admitting: Surgery

## 2024-07-25 ENCOUNTER — Other Ambulatory Visit: Payer: Self-pay

## 2024-07-25 VITALS — BP 142/63 | HR 90 | Temp 98.6°F | Resp 22 | Ht 70.0 in | Wt 244.0 lb

## 2024-07-25 DIAGNOSIS — Z01818 Encounter for other preprocedural examination: Secondary | ICD-10-CM | POA: Diagnosis not present

## 2024-07-25 DIAGNOSIS — R7989 Other specified abnormal findings of blood chemistry: Secondary | ICD-10-CM | POA: Diagnosis not present

## 2024-07-25 DIAGNOSIS — Z8619 Personal history of other infectious and parasitic diseases: Secondary | ICD-10-CM | POA: Insufficient documentation

## 2024-07-25 DIAGNOSIS — I444 Left anterior fascicular block: Secondary | ICD-10-CM | POA: Insufficient documentation

## 2024-07-25 DIAGNOSIS — R7303 Prediabetes: Secondary | ICD-10-CM

## 2024-07-25 DIAGNOSIS — I2510 Atherosclerotic heart disease of native coronary artery without angina pectoris: Secondary | ICD-10-CM | POA: Diagnosis not present

## 2024-07-25 HISTORY — DX: Unspecified osteoarthritis, unspecified site: M19.90

## 2024-07-25 HISTORY — DX: Prediabetes: R73.03

## 2024-07-25 HISTORY — DX: Atherosclerotic heart disease of native coronary artery without angina pectoris: I25.10

## 2024-07-25 LAB — COMPREHENSIVE METABOLIC PANEL WITH GFR
ALT: 21 U/L (ref 0–44)
AST: 20 U/L (ref 15–41)
Albumin: 4.6 g/dL (ref 3.5–5.0)
Alkaline Phosphatase: 88 U/L (ref 38–126)
Anion gap: 15 (ref 5–15)
BUN: 19 mg/dL (ref 8–23)
CO2: 20 mmol/L — ABNORMAL LOW (ref 22–32)
Calcium: 10.1 mg/dL (ref 8.9–10.3)
Chloride: 102 mmol/L (ref 98–111)
Creatinine, Ser: 1.06 mg/dL (ref 0.61–1.24)
GFR, Estimated: >60 mL/min (ref >60)
Glucose, Bld: 123 mg/dL — ABNORMAL HIGH (ref 70–99)
Potassium: 4.5 mmol/L (ref 3.5–5.1)
Sodium: 137 mmol/L (ref 135–145)
Total Bilirubin: 0.4 mg/dL (ref 0.0–1.2)
Total Protein: 6.8 g/dL (ref 6.5–8.1)

## 2024-07-25 LAB — CBC
HCT: 39.6 % (ref 39.0–52.0)
Hemoglobin: 13.4 g/dL (ref 13.0–17.0)
MCH: 31 pg (ref 26.0–34.0)
MCHC: 33.8 g/dL (ref 30.0–36.0)
MCV: 91.7 fL (ref 80.0–100.0)
Platelets: 244 K/uL (ref 150–400)
RBC: 4.32 MIL/uL (ref 4.22–5.81)
RDW: 12.2 % (ref 11.5–15.5)
WBC: 13.8 K/uL — ABNORMAL HIGH (ref 4.0–10.5)
nRBC: 0 % (ref 0.0–0.2)

## 2024-07-29 DIAGNOSIS — J3081 Allergic rhinitis due to animal (cat) (dog) hair and dander: Secondary | ICD-10-CM | POA: Diagnosis not present

## 2024-07-29 DIAGNOSIS — J301 Allergic rhinitis due to pollen: Secondary | ICD-10-CM | POA: Diagnosis not present

## 2024-07-29 DIAGNOSIS — J3089 Other allergic rhinitis: Secondary | ICD-10-CM | POA: Diagnosis not present

## 2024-08-01 ENCOUNTER — Encounter (HOSPITAL_COMMUNITY): Payer: Self-pay | Admitting: Surgery

## 2024-08-01 ENCOUNTER — Encounter (HOSPITAL_COMMUNITY)
Admission: RE | Disposition: A | Discharge status: Home / Self Care | Payer: Self-pay | Source: Ambulatory Visit | Attending: Surgery

## 2024-08-01 ENCOUNTER — Inpatient Hospital Stay (HOSPITAL_COMMUNITY): Payer: Self-pay | Admitting: Physician Assistant

## 2024-08-01 ENCOUNTER — Other Ambulatory Visit: Payer: Self-pay

## 2024-08-01 ENCOUNTER — Inpatient Hospital Stay (HOSPITAL_COMMUNITY)
Admission: RE | Admit: 2024-08-01 | Discharge: 2024-08-02 | DRG: 328 | Disposition: A | Discharge status: Home / Self Care | Source: Ambulatory Visit | Attending: Surgery | Admitting: Surgery

## 2024-08-01 ENCOUNTER — Inpatient Hospital Stay (HOSPITAL_COMMUNITY): Admitting: Anesthesiology

## 2024-08-01 DIAGNOSIS — K2270 Barrett's esophagus without dysplasia: Secondary | ICD-10-CM | POA: Diagnosis present

## 2024-08-01 DIAGNOSIS — Z791 Long term (current) use of non-steroidal anti-inflammatories (NSAID): Secondary | ICD-10-CM | POA: Diagnosis not present

## 2024-08-01 DIAGNOSIS — Z6835 Body mass index (BMI) 35.0-35.9, adult: Secondary | ICD-10-CM | POA: Diagnosis not present

## 2024-08-01 DIAGNOSIS — G4733 Obstructive sleep apnea (adult) (pediatric): Secondary | ICD-10-CM | POA: Diagnosis not present

## 2024-08-01 DIAGNOSIS — E66811 Obesity, class 1: Secondary | ICD-10-CM | POA: Diagnosis present

## 2024-08-01 DIAGNOSIS — Z820 Family history of epilepsy and other diseases of the nervous system: Secondary | ICD-10-CM

## 2024-08-01 DIAGNOSIS — K219 Gastro-esophageal reflux disease without esophagitis: Secondary | ICD-10-CM | POA: Diagnosis present

## 2024-08-01 DIAGNOSIS — Z903 Acquired absence of stomach [part of]: Secondary | ICD-10-CM | POA: Diagnosis not present

## 2024-08-01 DIAGNOSIS — Z87891 Personal history of nicotine dependence: Secondary | ICD-10-CM | POA: Diagnosis not present

## 2024-08-01 DIAGNOSIS — I10 Essential (primary) hypertension: Secondary | ICD-10-CM

## 2024-08-01 DIAGNOSIS — Z833 Family history of diabetes mellitus: Secondary | ICD-10-CM

## 2024-08-01 DIAGNOSIS — F419 Anxiety disorder, unspecified: Secondary | ICD-10-CM | POA: Diagnosis present

## 2024-08-01 DIAGNOSIS — D131 Benign neoplasm of stomach: Secondary | ICD-10-CM | POA: Diagnosis not present

## 2024-08-01 DIAGNOSIS — K429 Umbilical hernia without obstruction or gangrene: Secondary | ICD-10-CM | POA: Diagnosis present

## 2024-08-01 DIAGNOSIS — E7800 Pure hypercholesterolemia, unspecified: Secondary | ICD-10-CM | POA: Diagnosis present

## 2024-08-01 DIAGNOSIS — E1169 Type 2 diabetes mellitus with other specified complication: Secondary | ICD-10-CM | POA: Diagnosis not present

## 2024-08-01 DIAGNOSIS — K3189 Other diseases of stomach and duodenum: Principal | ICD-10-CM | POA: Diagnosis present

## 2024-08-01 DIAGNOSIS — Z809 Family history of malignant neoplasm, unspecified: Secondary | ICD-10-CM

## 2024-08-01 DIAGNOSIS — M6208 Separation of muscle (nontraumatic), other site: Secondary | ICD-10-CM | POA: Diagnosis present

## 2024-08-01 DIAGNOSIS — Z79899 Other long term (current) drug therapy: Secondary | ICD-10-CM | POA: Diagnosis not present

## 2024-08-01 DIAGNOSIS — K319 Disease of stomach and duodenum, unspecified: Principal | ICD-10-CM | POA: Diagnosis present

## 2024-08-01 DIAGNOSIS — Z818 Family history of other mental and behavioral disorders: Secondary | ICD-10-CM | POA: Diagnosis not present

## 2024-08-01 DIAGNOSIS — Z7982 Long term (current) use of aspirin: Secondary | ICD-10-CM | POA: Diagnosis not present

## 2024-08-01 DIAGNOSIS — I2510 Atherosclerotic heart disease of native coronary artery without angina pectoris: Secondary | ICD-10-CM | POA: Diagnosis not present

## 2024-08-01 DIAGNOSIS — K224 Dyskinesia of esophagus: Secondary | ICD-10-CM | POA: Diagnosis not present

## 2024-08-01 DIAGNOSIS — Z7985 Long-term (current) use of injectable non-insulin antidiabetic drugs: Secondary | ICD-10-CM

## 2024-08-01 DIAGNOSIS — F17210 Nicotine dependence, cigarettes, uncomplicated: Secondary | ICD-10-CM | POA: Diagnosis present

## 2024-08-01 DIAGNOSIS — I152 Hypertension secondary to endocrine disorders: Secondary | ICD-10-CM | POA: Diagnosis present

## 2024-08-01 DIAGNOSIS — Z7984 Long term (current) use of oral hypoglycemic drugs: Secondary | ICD-10-CM

## 2024-08-01 DIAGNOSIS — R1900 Intra-abdominal and pelvic swelling, mass and lump, unspecified site: Secondary | ICD-10-CM | POA: Diagnosis not present

## 2024-08-01 DIAGNOSIS — Z83438 Family history of other disorder of lipoprotein metabolism and other lipidemia: Secondary | ICD-10-CM

## 2024-08-01 DIAGNOSIS — Z8249 Family history of ischemic heart disease and other diseases of the circulatory system: Secondary | ICD-10-CM

## 2024-08-01 HISTORY — PX: ESOPHAGOGASTRODUODENOSCOPY: SHX5428

## 2024-08-01 HISTORY — PX: UMBILICAL HERNIA REPAIR: SHX196

## 2024-08-01 HISTORY — PX: GASTRECTOMY, PARTIAL, ROBOT-ASSISTED: SHX7576

## 2024-08-01 LAB — HEMOGLOBIN A1C
Hgb A1c MFr Bld: 6.1 % — ABNORMAL HIGH (ref 4.8–5.6)
Mean Plasma Glucose: 128.37 mg/dL

## 2024-08-01 LAB — GLUCOSE, CAPILLARY
Glucose-Capillary: 147 mg/dL — ABNORMAL HIGH (ref 70–99)
Glucose-Capillary: 136 mg/dL — ABNORMAL HIGH (ref 70–99)
Glucose-Capillary: 162 mg/dL — ABNORMAL HIGH (ref 70–99)
Glucose-Capillary: 148 mg/dL — ABNORMAL HIGH (ref 70–99)
Glucose-Capillary: 200 mg/dL — ABNORMAL HIGH (ref 70–99)

## 2024-08-01 SURGERY — GASTRECTOMY, PARTIAL, ROBOT-ASSISTED
Anesthesia: General

## 2024-08-01 MED ORDER — ROCURONIUM BROMIDE 100 MG/10ML IV SOLN
INTRAVENOUS | Status: DC | PRN
  Administered 2024-08-01: 80 mg via INTRAVENOUS

## 2024-08-01 MED ORDER — PROCHLORPERAZINE EDISYLATE 10 MG/2ML IJ SOLN: 5.0000 mg | Freq: Four times a day (QID) | INTRAMUSCULAR | Status: DC | PRN

## 2024-08-01 MED ORDER — MAGIC MOUTHWASH: 15.0000 mL | Freq: Four times a day (QID) | ORAL | Status: DC | PRN

## 2024-08-01 MED ORDER — ENSURE PRE-SURGERY PO LIQD
592.0000 mL | Freq: Once | ORAL | Status: DC
  Filled 2024-08-01: qty 592

## 2024-08-01 MED ORDER — SUGAMMADEX SODIUM 200 MG/2ML IV SOLN
INTRAVENOUS | Status: AC
  Filled 2024-08-01: qty 2

## 2024-08-01 MED ORDER — OXYCODONE HCL 5 MG PO TABS: 5.0000 mg | ORAL_TABLET | Freq: Once | ORAL | Status: DC | PRN

## 2024-08-01 MED ORDER — AMLODIPINE BESYLATE 5 MG PO TABS
5.0000 mg | ORAL_TABLET | Freq: Every day | ORAL | Status: DC
  Administered 2024-08-02: 5 mg via ORAL
  Filled 2024-08-01: qty 1

## 2024-08-01 MED ORDER — FENTANYL CITRATE (PF) 250 MCG/5ML IJ SOLN
INTRAMUSCULAR | Status: AC
  Filled 2024-08-01: qty 5

## 2024-08-01 MED ORDER — LIDOCAINE HCL (CARDIAC) PF 100 MG/5ML IV SOSY
PREFILLED_SYRINGE | INTRAVENOUS | Status: DC | PRN
  Administered 2024-08-01: 60 mg via INTRAVENOUS
  Administered 2024-08-01: 20 mg via INTRAVENOUS

## 2024-08-01 MED ORDER — BENZONATATE 100 MG PO CAPS
200.0000 mg | ORAL_CAPSULE | Freq: Three times a day (TID) | ORAL | Status: DC | PRN
  Administered 2024-08-01: 200 mg via ORAL
  Filled 2024-08-01: qty 2

## 2024-08-01 MED ORDER — CALCIUM POLYCARBOPHIL 625 MG PO TABS
625.0000 mg | ORAL_TABLET | Freq: Two times a day (BID) | ORAL | Status: DC
  Administered 2024-08-01 – 2024-08-02 (×2): 625 mg via ORAL
  Filled 2024-08-01 (×2): qty 1

## 2024-08-01 MED ORDER — ATORVASTATIN CALCIUM 20 MG PO TABS
20.0000 mg | ORAL_TABLET | Freq: Every evening | ORAL | Status: DC
Start: 2024-08-01 — End: 2024-08-02
  Administered 2024-08-01: 20 mg via ORAL
  Filled 2024-08-01: qty 1

## 2024-08-01 MED ORDER — ASPIRIN 81 MG PO TBEC
81.0000 mg | DELAYED_RELEASE_TABLET | Freq: Every day | ORAL | Status: DC
  Administered 2024-08-02: 81 mg via ORAL
  Filled 2024-08-01: qty 1

## 2024-08-01 MED ORDER — ALBUTEROL SULFATE HFA 108 (90 BASE) MCG/ACT IN AERS: 1.0000 | INHALATION_SPRAY | RESPIRATORY_TRACT | Status: DC | PRN

## 2024-08-01 MED ORDER — ACETAMINOPHEN 500 MG PO TABS
1000.0000 mg | ORAL_TABLET | ORAL | Status: AC
  Administered 2024-08-01: 1000 mg via ORAL

## 2024-08-01 MED ORDER — PANTOPRAZOLE SODIUM 40 MG PO TBEC
40.0000 mg | DELAYED_RELEASE_TABLET | Freq: Two times a day (BID) | ORAL | Status: DC
  Administered 2024-08-01 – 2024-08-02 (×2): 40 mg via ORAL
  Filled 2024-08-01 (×2): qty 1

## 2024-08-01 MED ORDER — LACTATED RINGERS IV SOLN
INTRAVENOUS | Status: DC
Start: 2024-08-01 — End: 2024-08-01

## 2024-08-01 MED ORDER — DEXAMETHASONE SODIUM PHOSPHATE 10 MG/ML IJ SOLN: 4.0000 mg | INTRAMUSCULAR | Status: DC

## 2024-08-01 MED ORDER — 0.9 % SODIUM CHLORIDE (POUR BTL) OPTIME
TOPICAL | Status: DC | PRN
  Administered 2024-08-01: 1000 mL

## 2024-08-01 MED ORDER — BUPIVACAINE LIPOSOME 1.3 % IJ SUSP: 20.0000 mL | Freq: Once | INTRAMUSCULAR | Status: DC

## 2024-08-01 MED ORDER — ACETAMINOPHEN 500 MG PO TABS
1000.0000 mg | ORAL_TABLET | Freq: Four times a day (QID) | ORAL | Status: DC
Start: 2024-08-01 — End: 2024-08-02
  Administered 2024-08-01 – 2024-08-02 (×4): 1000 mg via ORAL
  Filled 2024-08-01 (×4): qty 2

## 2024-08-01 MED ORDER — DIPHENHYDRAMINE HCL 12.5 MG/5ML PO ELIX: 12.5000 mg | ORAL_SOLUTION | Freq: Four times a day (QID) | ORAL | Status: DC | PRN

## 2024-08-01 MED ORDER — SODIUM CHLORIDE 0.9 % IV SOLN
2.0000 g | INTRAVENOUS | Status: AC
  Administered 2024-08-01: 2 g via INTRAVENOUS

## 2024-08-01 MED ORDER — AMLODIPINE BESYLATE-VALSARTAN 5-160 MG PO TABS: 1.0000 | ORAL_TABLET | Freq: Every day | ORAL | Status: DC

## 2024-08-01 MED ORDER — CHLORHEXIDINE GLUCONATE 0.12 % MT SOLN
15.0000 mL | Freq: Once | OROMUCOSAL | Status: AC
  Administered 2024-08-01: 15 mL via OROMUCOSAL

## 2024-08-01 MED ORDER — MAGNESIUM HYDROXIDE 400 MG/5ML PO SUSP: 30.0000 mL | Freq: Every day | ORAL | Status: DC | PRN

## 2024-08-01 MED ORDER — DEXAMETHASONE SODIUM PHOSPHATE 10 MG/ML IJ SOLN
INTRAMUSCULAR | Status: DC | PRN
  Administered 2024-08-01: 6 mg via INTRAVENOUS

## 2024-08-01 MED ORDER — LEVOCETIRIZINE DIHYDROCHLORIDE 5 MG PO TABS: 5.0000 mg | ORAL_TABLET | Freq: Every evening | ORAL | Status: DC

## 2024-08-01 MED ORDER — ADULT MULTIVITAMIN W/MINERALS CH
1.0000 | ORAL_TABLET | Freq: Every day | ORAL | Status: DC
  Administered 2024-08-02: 1 via ORAL
  Filled 2024-08-01: qty 1

## 2024-08-01 MED ORDER — LACTATED RINGERS IV SOLN: Freq: Three times a day (TID) | INTRAVENOUS | Status: DC | PRN

## 2024-08-01 MED ORDER — AZELASTINE HCL 0.1 % NA SOLN: 1.0000 | Freq: Two times a day (BID) | NASAL | Status: DC | PRN

## 2024-08-01 MED ORDER — MENTHOL 3 MG MT LOZG: 1.0000 | LOZENGE | OROMUCOSAL | Status: DC | PRN

## 2024-08-01 MED ORDER — IRBESARTAN 150 MG PO TABS
150.0000 mg | ORAL_TABLET | Freq: Every day | ORAL | Status: DC
Start: 2024-08-02 — End: 2024-08-02
  Administered 2024-08-02: 150 mg via ORAL
  Filled 2024-08-01: qty 1

## 2024-08-01 MED ORDER — METFORMIN HCL ER 500 MG PO TB24
1000.0000 mg | ORAL_TABLET | Freq: Two times a day (BID) | ORAL | Status: DC
  Administered 2024-08-01 – 2024-08-02 (×2): 1000 mg via ORAL
  Filled 2024-08-01 (×2): qty 2

## 2024-08-01 MED ORDER — FENTANYL CITRATE (PF) 100 MCG/2ML IJ SOLN
INTRAMUSCULAR | Status: DC | PRN
  Administered 2024-08-01 (×3): 50 ug via INTRAVENOUS
  Administered 2024-08-01: 100 ug via INTRAVENOUS

## 2024-08-01 MED ORDER — INSULIN ASPART 100 UNIT/ML IJ SOLN: 0.0000 [IU] | INTRAMUSCULAR | Status: DC | PRN

## 2024-08-01 MED ORDER — OXYCODONE HCL 5 MG/5ML PO SOLN: 5.0000 mg | Freq: Once | ORAL | Status: DC | PRN

## 2024-08-01 MED ORDER — SEMAGLUTIDE (2 MG/DOSE) 8 MG/3ML ~~LOC~~ SOPN: 2.0000 mg | PEN_INJECTOR | SUBCUTANEOUS | Status: DC

## 2024-08-01 MED ORDER — ONDANSETRON HCL 4 MG/2ML IJ SOLN
INTRAMUSCULAR | Status: AC
  Filled 2024-08-01: qty 2

## 2024-08-01 MED ORDER — MIDAZOLAM HCL 5 MG/5ML IJ SOLN
INTRAMUSCULAR | Status: DC | PRN
  Administered 2024-08-01: 2 mg via INTRAVENOUS

## 2024-08-01 MED ORDER — CHLORHEXIDINE GLUCONATE CLOTH 2 % EX PADS: 6.0000 | MEDICATED_PAD | Freq: Once | CUTANEOUS | Status: DC

## 2024-08-01 MED ORDER — SODIUM CHLORIDE 0.9 % IV SOLN
INTRAVENOUS | Status: AC
  Filled 2024-08-01: qty 20

## 2024-08-01 MED ORDER — LIDOCAINE HCL (PF) 2 % IJ SOLN
INTRAMUSCULAR | Status: AC
  Filled 2024-08-01: qty 5

## 2024-08-01 MED ORDER — FAMOTIDINE 20 MG PO TABS
20.0000 mg | ORAL_TABLET | Freq: Two times a day (BID) | ORAL | Status: DC
Start: 2024-08-01 — End: 2024-08-02
  Administered 2024-08-01 – 2024-08-02 (×2): 20 mg via ORAL
  Filled 2024-08-01 (×2): qty 1

## 2024-08-01 MED ORDER — ONDANSETRON HCL 4 MG/2ML IJ SOLN
INTRAMUSCULAR | Status: DC | PRN
  Administered 2024-08-01: 4 mg via INTRAVENOUS

## 2024-08-01 MED ORDER — FLUTICASONE PROPIONATE 50 MCG/ACT NA SUSP: 2.0000 | Freq: Every day | NASAL | Status: DC | PRN

## 2024-08-01 MED ORDER — NAPHAZOLINE-GLYCERIN 0.012-0.25 % OP SOLN
1.0000 [drp] | Freq: Four times a day (QID) | OPHTHALMIC | Status: DC | PRN
Start: 2024-08-01 — End: 2024-08-02

## 2024-08-01 MED ORDER — TADALAFIL 5 MG PO TABS: 5.0000 mg | ORAL_TABLET | Freq: Every day | ORAL | Status: DC

## 2024-08-01 MED ORDER — BUPIVACAINE-EPINEPHRINE 0.25% -1:200000 IJ SOLN
INTRAMUSCULAR | Status: DC | PRN
  Administered 2024-08-01: 30 mL

## 2024-08-01 MED ORDER — PROCHLORPERAZINE MALEATE 10 MG PO TABS: 10.0000 mg | ORAL_TABLET | Freq: Four times a day (QID) | ORAL | Status: DC | PRN

## 2024-08-01 MED ORDER — ALPRAZOLAM 0.25 MG PO TABS: 0.2500 mg | ORAL_TABLET | Freq: Two times a day (BID) | ORAL | Status: DC | PRN

## 2024-08-01 MED ORDER — GABAPENTIN 300 MG PO CAPS
300.0000 mg | ORAL_CAPSULE | ORAL | Status: AC
  Administered 2024-08-01: 300 mg via ORAL

## 2024-08-01 MED ORDER — ONDANSETRON 4 MG PO TBDP: 4.0000 mg | ORAL_TABLET | Freq: Four times a day (QID) | ORAL | Status: DC | PRN

## 2024-08-01 MED ORDER — TRAMADOL HCL 50 MG PO TABS
50.0000 mg | ORAL_TABLET | Freq: Four times a day (QID) | ORAL | Status: DC | PRN
  Administered 2024-08-01 – 2024-08-02 (×4): 100 mg via ORAL
  Filled 2024-08-01 (×4): qty 2

## 2024-08-01 MED ORDER — PHENOL 1.4 % MT LIQD: 2.0000 | OROMUCOSAL | Status: DC | PRN

## 2024-08-01 MED ORDER — SODIUM CHLORIDE 0.9% FLUSH: 3.0000 mL | INTRAVENOUS | Status: DC | PRN

## 2024-08-01 MED ORDER — SALINE SPRAY 0.65 % NA SOLN
1.0000 | Freq: Four times a day (QID) | NASAL | Status: DC | PRN
Start: 2024-08-01 — End: 2024-08-02

## 2024-08-01 MED ORDER — LORATADINE 10 MG PO TABS
10.0000 mg | ORAL_TABLET | Freq: Every evening | ORAL | Status: DC
  Administered 2024-08-01: 10 mg via ORAL
  Filled 2024-08-01: qty 1

## 2024-08-01 MED ORDER — ACETAMINOPHEN 500 MG PO TABS
ORAL_TABLET | ORAL | Status: AC
  Filled 2024-08-01: qty 2

## 2024-08-01 MED ORDER — ONDANSETRON HCL 4 MG/2ML IJ SOLN: 4.0000 mg | Freq: Four times a day (QID) | INTRAMUSCULAR | Status: DC | PRN

## 2024-08-01 MED ORDER — GABAPENTIN 300 MG PO CAPS
ORAL_CAPSULE | ORAL | Status: AC
  Filled 2024-08-01: qty 1

## 2024-08-01 MED ORDER — ALBUTEROL SULFATE (2.5 MG/3ML) 0.083% IN NEBU: 2.5000 mg | INHALATION_SOLUTION | RESPIRATORY_TRACT | Status: DC | PRN

## 2024-08-01 MED ORDER — ORAL CARE MOUTH RINSE: 15.0000 mL | Freq: Once | OROMUCOSAL | Status: AC

## 2024-08-01 MED ORDER — METOPROLOL TARTRATE 5 MG/5ML IV SOLN: 5.0000 mg | Freq: Four times a day (QID) | INTRAVENOUS | Status: DC | PRN

## 2024-08-01 MED ORDER — ENOXAPARIN SODIUM 40 MG/0.4ML IJ SOSY
40.0000 mg | PREFILLED_SYRINGE | INTRAMUSCULAR | Status: DC
  Administered 2024-08-02: 40 mg via SUBCUTANEOUS
  Filled 2024-08-01: qty 0.4

## 2024-08-01 MED ORDER — GLYCOPYRROLATE 0.2 MG/ML IJ SOLN
INTRAMUSCULAR | Status: DC | PRN
  Administered 2024-08-01: .1 mg via INTRAVENOUS

## 2024-08-01 MED ORDER — BUPIVACAINE-EPINEPHRINE (PF) 0.25% -1:200000 IJ SOLN
INTRAMUSCULAR | Status: AC
  Filled 2024-08-01: qty 60

## 2024-08-01 MED ORDER — BISACODYL 10 MG RE SUPP: 10.0000 mg | Freq: Every day | RECTAL | Status: DC | PRN

## 2024-08-01 MED ORDER — LACTATED RINGERS IV SOLN: INTRAVENOUS | Status: DC | PRN

## 2024-08-01 MED ORDER — GABAPENTIN 100 MG PO CAPS
300.0000 mg | ORAL_CAPSULE | Freq: Two times a day (BID) | ORAL | Status: DC
Start: 2024-08-01 — End: 2024-08-02
  Administered 2024-08-01 – 2024-08-02 (×2): 300 mg via ORAL
  Filled 2024-08-01 (×2): qty 3

## 2024-08-01 MED ORDER — FINASTERIDE 5 MG PO TABS
5.0000 mg | ORAL_TABLET | Freq: Every day | ORAL | Status: DC
  Administered 2024-08-01 – 2024-08-02 (×2): 5 mg via ORAL
  Filled 2024-08-01 (×2): qty 1

## 2024-08-01 MED ORDER — ENSURE PRE-SURGERY PO LIQD
296.0000 mL | Freq: Once | ORAL | Status: DC
  Filled 2024-08-01: qty 296

## 2024-08-01 MED ORDER — ONDANSETRON HCL 4 MG PO TABS: 4.0000 mg | ORAL_TABLET | Freq: Three times a day (TID) | ORAL | 5 refills | Status: AC | PRN

## 2024-08-01 MED ORDER — ACETAMINOPHEN 10 MG/ML IV SOLN: 1000.0000 mg | Freq: Once | INTRAVENOUS | Status: DC | PRN

## 2024-08-01 MED ORDER — SUGAMMADEX SODIUM 200 MG/2ML IV SOLN
INTRAVENOUS | Status: DC | PRN
  Administered 2024-08-01: 200 mg via INTRAVENOUS

## 2024-08-01 MED ORDER — METHOCARBAMOL 500 MG PO TABS
500.0000 mg | ORAL_TABLET | Freq: Four times a day (QID) | ORAL | Status: DC | PRN
  Administered 2024-08-01 – 2024-08-02 (×2): 500 mg via ORAL
  Filled 2024-08-01 (×3): qty 1

## 2024-08-01 MED ORDER — INSULIN ASPART 100 UNIT/ML IJ SOLN
0.0000 [IU] | Freq: Three times a day (TID) | INTRAMUSCULAR | Status: DC
  Administered 2024-08-02: 2 [IU] via SUBCUTANEOUS

## 2024-08-01 MED ORDER — SIMETHICONE 80 MG PO CHEW
40.0000 mg | CHEWABLE_TABLET | Freq: Four times a day (QID) | ORAL | Status: DC | PRN
  Administered 2024-08-01 – 2024-08-02 (×2): 40 mg via ORAL
  Filled 2024-08-01 (×2): qty 1

## 2024-08-01 MED ORDER — INSULIN ASPART 100 UNIT/ML IJ SOLN: 0.0000 [IU] | Freq: Every day | INTRAMUSCULAR | Status: DC

## 2024-08-01 MED ORDER — DIPHENHYDRAMINE HCL 50 MG/ML IJ SOLN: 12.5000 mg | Freq: Four times a day (QID) | INTRAMUSCULAR | Status: DC | PRN

## 2024-08-01 MED ORDER — ZOLPIDEM TARTRATE 5 MG PO TABS
10.0000 mg | ORAL_TABLET | Freq: Every day | ORAL | Status: DC
Start: 2024-08-01 — End: 2024-08-02
  Administered 2024-08-01: 10 mg via ORAL
  Filled 2024-08-01: qty 2

## 2024-08-01 MED ORDER — TRAMADOL HCL 50 MG PO TABS: 50.0000 mg | ORAL_TABLET | Freq: Four times a day (QID) | ORAL | 0 refills | Status: AC | PRN

## 2024-08-01 MED ORDER — METHOCARBAMOL 1000 MG/10ML IJ SOLN: 1000.0000 mg | Freq: Four times a day (QID) | INTRAMUSCULAR | Status: DC | PRN

## 2024-08-01 MED ORDER — MIRABEGRON ER 25 MG PO TB24
50.0000 mg | ORAL_TABLET | Freq: Every day | ORAL | Status: DC
  Administered 2024-08-02: 50 mg via ORAL
  Filled 2024-08-01: qty 2

## 2024-08-01 MED ORDER — STERILE WATER FOR IRRIGATION IR SOLN
Status: DC | PRN
  Administered 2024-08-01: 1000 mL

## 2024-08-01 MED ORDER — FENTANYL CITRATE PF 50 MCG/ML IJ SOSY: 25.0000 ug | PREFILLED_SYRINGE | INTRAMUSCULAR | Status: DC | PRN

## 2024-08-01 MED ORDER — ROCURONIUM BROMIDE 10 MG/ML (PF) SYRINGE
PREFILLED_SYRINGE | INTRAVENOUS | Status: AC
  Filled 2024-08-01: qty 10

## 2024-08-01 MED ORDER — PROPOFOL 10 MG/ML IV BOLUS
INTRAVENOUS | Status: DC | PRN
  Administered 2024-08-01: 20 mg via INTRAVENOUS
  Administered 2024-08-01: 140 mg via INTRAVENOUS

## 2024-08-01 MED ORDER — MIDAZOLAM HCL 2 MG/2ML IJ SOLN
INTRAMUSCULAR | Status: AC
  Filled 2024-08-01: qty 2

## 2024-08-01 MED ORDER — SODIUM CHLORIDE 0.9% FLUSH
3.0000 mL | Freq: Two times a day (BID) | INTRAVENOUS | Status: DC
  Administered 2024-08-02: 3 mL via INTRAVENOUS

## 2024-08-01 MED ORDER — MONTELUKAST SODIUM 10 MG PO TABS
10.0000 mg | ORAL_TABLET | Freq: Every day | ORAL | Status: DC
  Administered 2024-08-01: 10 mg via ORAL
  Filled 2024-08-01: qty 1

## 2024-08-01 MED ORDER — SODIUM CHLORIDE 0.9 % IV SOLN: 250.0000 mL | INTRAVENOUS | Status: DC | PRN

## 2024-08-01 MED ORDER — KCL IN DEXTROSE-NACL 20-5-0.45 MEQ/L-%-% IV SOLN
INTRAVENOUS | Status: DC
  Filled 2024-08-01: qty 1000

## 2024-08-01 MED ORDER — HYDROMORPHONE HCL 1 MG/ML IJ SOLN: 0.5000 mg | INTRAMUSCULAR | Status: DC | PRN

## 2024-08-01 MED ORDER — PROPOFOL 10 MG/ML IV BOLUS
INTRAVENOUS | Status: AC
  Filled 2024-08-01: qty 20

## 2024-08-01 SURGICAL SUPPLY — 61 items
APPLICATOR COTTON TIP 6 STRL (MISCELLANEOUS) ×1 IMPLANT
BAG COUNTER SPONGE SURGICOUNT (BAG) ×1 IMPLANT
BLADE SURG SZ11 CARB STEEL (BLADE) ×1 IMPLANT
CHLORAPREP W/TINT 26 (MISCELLANEOUS) ×1 IMPLANT
CLIP APPLIE 5 13 M/L LIGAMAX5 (MISCELLANEOUS) IMPLANT
COVER SURGICAL LIGHT HANDLE (MISCELLANEOUS) ×1 IMPLANT
COVER TIP SHEARS 8 DVNC (MISCELLANEOUS) IMPLANT
DRAIN CHANNEL 19F RND (DRAIN) IMPLANT
DRAPE ARM DVNC X/XI (DISPOSABLE) ×4 IMPLANT
DRAPE COLUMN DVNC XI (DISPOSABLE) ×1 IMPLANT
DRAPE WARM FLUID 44X44 (DRAPES) ×1 IMPLANT
DRIVER NDL LRG 8 DVNC XI (INSTRUMENTS) IMPLANT
DRIVER NDL MEGA SUTCUT DVNCXI (INSTRUMENTS) IMPLANT
DRIVER NDLE LRG 8 DVNC XI (INSTRUMENTS) IMPLANT
DRIVER NDLE MEGA SUTCUT DVNCXI (INSTRUMENTS) IMPLANT
DRSG TEGADERM 2-3/8X2-3/4 SM (GAUZE/BANDAGES/DRESSINGS) ×5 IMPLANT
DRSG TEGADERM 4X4.75 (GAUZE/BANDAGES/DRESSINGS) IMPLANT
ELECT PENCIL ROCKER SW 15FT (MISCELLANEOUS) IMPLANT
ELECT REM PT RETURN 15FT ADLT (MISCELLANEOUS) ×1 IMPLANT
ENDOLOOP SUT PDS II 0 18 (SUTURE) IMPLANT
EVACUATOR DRAINAGE 10X20 100CC (DRAIN) IMPLANT
EVACUATOR SILICONE 100CC (DRAIN) IMPLANT
FORCEPS PROGRASP DVNC XI (FORCEP) IMPLANT
GAUZE SPONGE 2X2 8PLY STRL LF (GAUZE/BANDAGES/DRESSINGS) ×1 IMPLANT
GLOVE ECLIPSE 8.0 STRL XLNG CF (GLOVE) ×2 IMPLANT
GLOVE INDICATOR 8.0 STRL GRN (GLOVE) ×2 IMPLANT
GOWN STRL REUS W/ TWL XL LVL3 (GOWN DISPOSABLE) ×2 IMPLANT
GRASPER SUT TROCAR 14GX15 (MISCELLANEOUS) IMPLANT
GRASPER TIP-UP FEN DVNC XI (INSTRUMENTS) ×1 IMPLANT
IRRIGATION SUCT STRKRFLW 2 WTP (MISCELLANEOUS) ×1 IMPLANT
KIT BASIN OR (CUSTOM PROCEDURE TRAY) ×1 IMPLANT
KIT TURNOVER KIT A (KITS) ×1 IMPLANT
NDL HYPO 22X1.5 SAFETY MO (MISCELLANEOUS) ×1 IMPLANT
NDL INSUFFLATION 14GA 120MM (NEEDLE) ×1 IMPLANT
NEEDLE HYPO 22X1.5 SAFETY MO (MISCELLANEOUS) ×1 IMPLANT
NEEDLE INSUFFLATION 14GA 120MM (NEEDLE) ×1 IMPLANT
PACK CARDIOVASCULAR III (CUSTOM PROCEDURE TRAY) ×1 IMPLANT
PAD POSITIONING PINK XL (MISCELLANEOUS) ×1 IMPLANT
RELOAD STAPLE 60 3.5 BLU DVNC (STAPLE) IMPLANT
RELOAD STAPLE 60 4.3 GRN DVNC (STAPLE) IMPLANT
SCISSORS LAP 5X45 EPIX DISP (ENDOMECHANICALS) IMPLANT
SCISSORS MNPLR CVD DVNC XI (INSTRUMENTS) IMPLANT
SEAL UNIV 5-12 XI (MISCELLANEOUS) ×4 IMPLANT
SEALER VESSEL EXT DVNC XI (MISCELLANEOUS) ×1 IMPLANT
SOLUTION ELECTROSURG ANTI STCK (MISCELLANEOUS) ×1 IMPLANT
SPIKE FLUID TRANSFER (MISCELLANEOUS) ×1 IMPLANT
STAPLER 60 SUREFORM DVNC (STAPLE) ×1 IMPLANT
STOPCOCK 4 WAY LG BORE MALE ST (IV SETS) ×2 IMPLANT
SUT MNCRL AB 4-0 PS2 18 (SUTURE) ×1 IMPLANT
SUT PDS AB 1 CT1 27 (SUTURE) IMPLANT
SUT PROLENE 2 0 SH DA (SUTURE) IMPLANT
SUT STRATA PDS 2-0 23 CT-1 (SUTURE) IMPLANT
SUT VICRYL 0 UR6 27IN ABS (SUTURE) IMPLANT
SYR 10ML LL (SYRINGE) ×1 IMPLANT
SYR 20ML LL LF (SYRINGE) ×1 IMPLANT
SYSTEM WOUND ALEXIS 18CM MED (MISCELLANEOUS) IMPLANT
TOWEL OR 17X26 10 PK STRL BLUE (TOWEL DISPOSABLE) ×1 IMPLANT
TRAY FOLEY MTR SLVR 14FR STAT (SET/KITS/TRAYS/PACK) IMPLANT
TRAY FOLEY MTR SLVR 16FR STAT (SET/KITS/TRAYS/PACK) IMPLANT
TROCAR ADV FIXATION 5X100MM (TROCAR) ×1 IMPLANT
TUBING INSUFFLATION 10FT LAP (TUBING) ×1 IMPLANT

## 2024-08-01 NOTE — Transfer of Care (Signed)
 Immediate Anesthesia Transfer of Care Note  Patient: Marc Gates  Procedure(s) Performed: GASTRECTOMY, PARTIAL, ROBOT-ASSISTED EGD (ESOPHAGOGASTRODUODENOSCOPY) REPAIR, HERNIA, UMBILICAL, ADULT  Patient Location: PACU  Anesthesia Type:General  Level of Consciousness: awake, alert , oriented, and patient cooperative  Airway & Oxygen Therapy: Patient Spontanous Breathing and Patient connected to face mask oxygen  Post-op Assessment: Report given to RN and Post -op Vital signs reviewed and stable  Post vital signs: Reviewed and stable  Last Vitals:  Vitals Value Taken Time  BP 146/70 08/01/24 13:30  Temp 36.5 C 08/01/24 13:27  Pulse 69 08/01/24 13:36  Resp 15 08/01/24 13:30  SpO2 99 % 08/01/24 13:36  Vitals shown include unfiled device data.  Last Pain:  Vitals:   08/01/24 1030  TempSrc:   PainSc: 0-No pain         Complications: There were no known notable events for this encounter.

## 2024-08-01 NOTE — Progress Notes (Signed)
   08/01/24 2257  BiPAP/CPAP/SIPAP  BiPAP/CPAP/SIPAP Pt Type Adult  BiPAP/CPAP/SIPAP Resmed  Mask Type Full face mask  Dentures removed? Not applicable  Mask Size Large  FiO2 (%) 21 %  Patient Home Machine No  Patient Home Mask Yes  Patient Home Tubing Yes  Auto Titrate Yes  Minimum cmH2O 5 cmH2O  Maximum cmH2O 20 cmH2O  Device Plugged into [employer] Outlet Yes

## 2024-08-01 NOTE — Discharge Instructions (Addendum)
 SURGERY: POST OP INSTRUCTIONS (Surgery for stomach resection, etc)   ######################################################################  EAT Start with blenderized foods and shorter meals as tolerated.   Gradually transition to a high fiber diet with a fiber supplement over the next week after discharge  WALK Walk an hour a day.  Control your pain to do that.    CONTROL PAIN Control pain so that you can walk, sleep, tolerate sneezing/coughing, go up/down stairs.  HAVE A BOWEL MOVEMENT DAILY Keep your bowels regular to avoid problems.  OK to try a laxative to override constipation.  OK to use an antidairrheal to slow down diarrhea.  Call if not better after 2 tries  CALL IF YOU HAVE PROBLEMS/CONCERNS Call if you are still struggling despite following these instructions. Call if you have concerns not answered by these instructions  ######################################################################   DIET Follow a blenderized/full liquid diet the first few days at home.  Sometimes smaller more frequent meals are easier to handle at first Start with a bland diet such as soups, liquids, starchy foods, low fat foods, etc.  If you feel full, bloated, or constipated, stay on a ful liquid or pureed/blenderized diet for a few days until you feel better and no longer constipated. Be sure to drink plenty of fluids every day to avoid getting dehydrated (feeling dizzy, not urinating, etc.).  Gradually add a fiber supplement to your diet over the next week.  Gradually get back to a regular solid diet by the first week.SABRA  Avoid fast food or heavy meals the first week as you are more likely to get nauseated.  It is expected for your digestive tract to need a few months to get back to normal.  It is common for your bowel movements and stools to be irregular.  You will have occasional bloating and cramping that should eventually fade away.  Until you are eating solid food normally, off all pain  medications, and back to regular activities; your bowels will not be normal.  Focus on eating a low-fat, high fiber diet the rest of your life (See Getting to Good Bowel Health, below).  CARE of your INCISION or WOUND  It is good for closed incisions and even open wounds to be washed every day.  Shower every day.  Short baths are fine.  Wash the incisions and wounds clean with soap & water .    You may leave closed incisions open to air if it is dry.   You may cover the incision with clean gauze & replace it after your daily shower for comfort.  TEGADERM:  You have clear gauze band-aid dressings over your closed incision(s).  Remove the dressings 2 days after surgery = 9/27.    If you have an open wound with a wound vac, see wound vac care instructions.    ACTIVITIES as tolerated Start light daily activities --- self-care, walking, climbing stairs-- beginning the day after surgery.  Gradually increase activities as tolerated.  Control your pain to be active.  Stop when you are tired.  Ideally, walk several times a day, eventually an hour a day.   Most people are back to most day-to-day activities in a few weeks.  It takes 4-8 weeks to get back to unrestricted, intense activity. If you can walk 30 minutes without difficulty, it is safe to try more intense activity such as jogging, treadmill, bicycling, low-impact aerobics, swimming, etc. Save the most intensive and strenuous activity for last (Usually 4-8 weeks after surgery) such as sit-ups, heavy  lifting, contact sports, etc.  Refrain from any intense heavy lifting or straining until you are off narcotics for pain control.  You will have off days, but things should improve week-by-week. DO NOT PUSH THROUGH PAIN.  Let pain be your guide: If it hurts to do something, don't do it.  Pain is your body warning you to avoid that activity for another week until the pain goes down. You may drive when you are no longer taking narcotic prescription pain  medication, you can comfortably wear a seatbelt, and you can safely make sudden turns/stops to protect yourself without hesitating due to pain. You may have sexual intercourse when it is comfortable. If it hurts to do something, stop.   MEDICATIONS Take your usually prescribed home medications unless otherwise directed.    Blood thinners:  You can restart any strong blood thinners after the second postoperative day  for example: COUMADIN (warfarin), XERELTO (rivaroxaban), ELIQUIS (apixaban), PLAVIX (clopidigrel), BRILINTA (ticagrelor), EFFIENT (prasugrel), PRADAXA (dabigatran), etc  Continue aspirin  before & after surgery..     Some oozing/bleeding the first 1-2 weeks is common but should taper down & be small volume.    If you are passing many large clots or having uncontrolling bleeding, call your surgeon    PAIN CONTROL Pain after surgery or related to activity is often due to strain/injury to muscle, tendon, nerves and/or incisions.  This pain is usually short-term and will improve in a few months.  To help speed the process of healing and to get back to regular activity more quickly, DO THE FOLLOWING THINGS TOGETHER: Increase activity gradually.  DO NOT PUSH THROUGH PAIN Use Ice and/or Heat Try Gentle Massage and/or Stretching Take over the counter pain medication Take Narcotic prescription pain medication for more severe pain  Good pain control = faster recovery.  It is better to take more medicine to be more active than to stay in bed all day to avoid medications.  Increase activity gradually Avoid heavy lifting at first, then increase to lifting as tolerated over the next 6 weeks. Do not "push through" the pain.  Listen to your body and avoid positions and maneuvers than reproduce the pain.  Wait a few days before trying something more intense Walking an hour a day is encouraged to help your body recover faster and more safely.  Start slowly and stop when getting sore.  If you  can walk 30 minutes without stopping or pain, you can try more intense activity (running, jogging, aerobics, cycling, swimming, treadmill, sex, sports, weightlifting, etc.) Remember: If it hurts to do it, then don't do it! Use Ice and/or Heat You will have swelling and bruising around the incisions.  This will take several weeks to resolve. Ice packs or heating pads (6-8 times a day, 30-60 minutes at a time) will help sooth soreness & bruising. Some people prefer to use ice alone, heat alone, or alternate between ice & heat.  Experiment and see what works best for you.  Consider trying ice for the first few days to help decrease swelling and bruising; then, switch to heat to help relax sore spots and speed recovery. Shower every day.  Short baths are fine.  It feels good!  Keep the incisions and wounds clean with soap & water .   Try Gentle Massage and/or Stretching Massage at the area of pain many times a day Stop if you feel pain - do not overdo it Take over the counter pain medication This helps the muscle and  nerve tissues become less irritable and calm down faster Choose ONE of the following over-the-counter anti-inflammatory medications: Acetaminophen  500mg  tabs (Tylenol ) 1-2 pills with every meal and just before bedtime (avoid if you have liver problems or if you have acetaminophen  in you narcotic prescription) Naproxen 220mg  tabs (ex. Aleve, Naprosyn) 1-2 pills twice a day (avoid if you have kidney, stomach, IBD, or bleeding problems) Ibuprofen 200mg  tabs (ex. Advil, Motrin) 3-4 pills with every meal and just before bedtime (avoid if you have kidney, stomach, IBD, or bleeding problems) Take with food/snack several times a day as directed for at least 2 weeks to help keep pain / soreness down & more manageable. Take Narcotic prescription pain medication for more severe pain A prescription for strong pain control is often given to you upon discharge (for example: oxycodone /Percocet,  hydrocodone/Norco/Vicodin, or tramadol /Ultram ) Take your pain medication as prescribed. Be mindful that most narcotic prescriptions contain Tylenol  (acetaminophen ) as well - avoid taking too much Tylenol . If you are having problems/concerns with the prescription medicine (does not control pain, nausea, vomiting, rash, itching, etc.), please call us  [phone] to see if we need to switch you to a different pain medicine that will work better for you and/or control your side effects better. If you need a refill on your pain medication, you must call the office before 4 pm and on weekdays only.  By federal law, prescriptions for narcotics cannot be called into a pharmacy.  They must be filled out on paper & picked up from our office by the patient or authorized caretaker.  Prescriptions cannot be filled after 4 pm nor on weekends.    WHEN TO CALL US  [phone] Severe uncontrolled or worsening pain  Fever over 101 F (38.5 C) Concerns with the incision: Worsening pain, redness, rash/hives, swelling, bleeding, or drainage Reactions / problems with new medications (itching, rash, hives, nausea, etc.) Nausea and/or vomiting Difficulty urinating Difficulty breathing Worsening fatigue, dizziness, lightheadedness, blurred vision Other concerns If you are not getting better after two weeks or are noticing you are getting worse, contact our office [phone] for further advice.  We may need to adjust your medications, re-evaluate you in the office, send you to the emergency room, or see what other things we can do to help. The clinic staff is available to answer your questions during regular business hours (8:30am-5pm).  Please don't hesitate to call and ask to speak to one of our nurses for clinical concerns.    A surgeon from [facility] is always on call at the hospitals 24 hours/day If you have a medical emergency, go to the nearest emergency room or call 911.  FOLLOW UP in our  office One the day of your discharge from the hospital (or the next business weekday), please call [facility] to set up or confirm an appointment to see your surgeon in the office for a follow-up appointment.  Usually it is 2-3 weeks after your surgery.   If you have skin staples at your incision(s), let the office know so we can set up a time in the office for the nurse to remove them (usually around 10 days after surgery). Make sure that you call for appointments the day of discharge (or the next business weekday) from the hospital to ensure a convenient appointment time. IF YOU HAVE DISABILITY OR FAMILY LEAVE FORMS, BRING THEM TO THE OFFICE FOR PROCESSING.  DO NOT GIVE THEM TO YOUR DOCTOR.  [facility], [place] [address], [place], [place]  [address] ? [phone] - Main [phone] - Toll Free,  [phone] - Fax [email or web address]    GETTING TO GOOD BOWEL HEALTH. It is expected for your digestive tract to need a few months to get back to normal.  It is common for your bowel movements and stools to be irregular.  You will have occasional bloating and cramping that should eventually fade away.  Until you are eating solid food normally, off all pain medications, and back to regular activities; your bowels will not be normal.   Avoiding constipation The goal: ONE SOFT BOWEL MOVEMENT A DAY!    Drink plenty of fluids.  Choose water  first. TAKE A FIBER SUPPLEMENT EVERY DAY THE REST OF YOUR LIFE During your first week back home, gradually add back a fiber supplement every day Experiment which form you can tolerate.   There are many forms such as powders, tablets, wafers, gummies, etc Psyllium bran (Metamucil), methylcellulose (Citrucel), Miralax or Glycolax, Benefiber, Flax Seed.  Adjust the dose week-by-week (1/2 dose/day to 6 doses a day) until you are moving your bowels 1-2 times a day.  Cut back the dose or try a different fiber  product if it is giving you problems such as diarrhea or bloating. Sometimes a laxative is needed to help jump-start bowels if constipated until the fiber supplement can help regulate your bowels.  If you are tolerating eating & you are farting, it is okay to try a gentle laxative such as double dose MiraLax, prune juice, or Milk of Magnesia.  Avoid using laxatives too often. Stool softeners can sometimes help counteract the constipating effects of narcotic pain medicines.  It can also cause diarrhea, so avoid using for too long. If you are still constipated despite taking fiber daily, eating solids, and a few doses of laxatives, call our office. Controlling diarrhea Try drinking liquids and eating bland foods for a few days to avoid stressing your intestines further. Avoid dairy products (especially milk & ice cream) for a short time.  The intestines often can lose the ability to digest lactose when stressed. Avoid foods that cause gassiness or bloating.  Typical foods include beans and other legumes, cabbage, broccoli, and dairy foods.  Avoid greasy, spicy, fast foods.  Every person has some sensitivity to other foods, so listen to your body and avoid those foods that trigger problems for you. Probiotics (such as active yogurt, Align, etc) may help repopulate the intestines and colon with normal bacteria and calm down a sensitive digestive tract Adding a fiber supplement gradually can help thicken stools by absorbing excess fluid and retrain the intestines to act more normally.  Slowly increase the dose over a few weeks.  Too much fiber too soon can backfire and cause cramping & bloating. It is okay to try and slow down diarrhea with a few doses of antidiarrheal medicines.   Bismuth subsalicylate (ex. Kayopectate, Pepto Bismol) for a few doses can help control diarrhea.  Avoid if pregnant.   Loperamide (Imodium) can slow down diarrhea.  Start with one tablet (2mg ) first.  Avoid if you are having fevers  or severe pain.  ILEOSTOMY PATIENTS WILL HAVE CHRONIC DIARRHEA since their colon is not in use.    Drink plenty of liquids.  You will need to drink even more glasses of water /liquid a day to avoid getting dehydrated. Record output from your ileostomy.  Expect to empty the bag every 3-4 hours at first.  Most people  with a permanent ileostomy empty their bag 4-6 times at the least.   Use antidiarrheal medicine (especially Imodium) several times a day to avoid getting dehydrated.  Start with a dose at bedtime & breakfast.  Adjust up or down as needed.  Increase antidiarrheal medications as directed to avoid emptying the bag more than 8 times a day (every 3 hours). Work with your wound ostomy nurse to learn care for your ostomy.  See ostomy care instructions. TROUBLESHOOTING IRREGULAR BOWELS 1) Start with a soft & bland diet. No spicy, greasy, or fried foods.  2) Avoid gluten/wheat or dairy products from diet to see if symptoms improve. 3) Miralax 17gm or flax seed mixed in 8oz. water  or juice-daily. May use 2-4 times a day as needed. 4) Gas-X, Phazyme, etc. as needed for gas & bloating.  5) Prilosec (omeprazole) over-the-counter as needed 6)  Consider probiotics (Align, Activa, etc) to help calm the bowels down  Call your doctor if you are getting worse or not getting better.  Sometimes further testing (cultures, endoscopy, X-ray studies, CT scans, bloodwork, etc.) may be needed to help diagnose and treat the cause of the diarrhea. [facility] [address], [address], [place], [place]  [address] [phone] - Main.    [phone]  - Toll Free.   [phone] - Fax [email or web address]

## 2024-08-01 NOTE — Anesthesia Preprocedure Evaluation (Signed)
 Anesthesia Evaluation  Patient identified by MRN, date of birth, ID band Patient awake    Reviewed: Allergy & Precautions, NPO status , Patient's Chart, lab work & pertinent test results  History of Anesthesia Complications Negative for: history of anesthetic complications  Airway Mallampati: II  TM Distance: >3 FB Neck ROM: Full    Dental  (+) Teeth Intact, Dental Advisory Given   Pulmonary neg shortness of breath, sleep apnea and Continuous Positive Airway Pressure Ventilation , neg COPD, neg recent URI, former smoker   breath sounds clear to auscultation       Cardiovascular hypertension, Pt. on medications (-) angina + CAD  (-) Past MI and (-) CHF  Rhythm:Regular   1. Left ventricular ejection fraction, by estimation, is 55 to 60%. The  left ventricle has normal function. The left ventricle has no regional  wall motion abnormalities. Left ventricular diastolic parameters are  consistent with Grade I diastolic  dysfunction (impaired relaxation).   2. Right ventricular systolic function is normal. The right ventricular  size is normal.   3. Left atrial size was mildly dilated.   4. Right atrial size was mildly dilated.   5. The mitral valve is normal in structure. No evidence of mitral valve  regurgitation. No evidence of mitral stenosis.   6. The aortic valve is normal in structure. Aortic valve regurgitation is  not visualized. No aortic stenosis is present.   7. The inferior vena cava is normal in size with greater than 50%  respiratory variability, suggesting right atrial pressure of 3 mmHg.     Prox LAD to Mid LAD lesion is 25% stenosed.  LV end diastolic pressure is normal.   1. Mild nonobstructive CAD 2. Normal LVEDP   Plan: risk factor modification.     Neuro/Psych neg Seizures PSYCHIATRIC DISORDERS Anxiety        GI/Hepatic Neg liver ROS,GERD  ,,  Endo/Other  diabetes  Lab Results      Component                 Value               Date                      HGBA1C                   5.9 (H)             09/09/2021             Renal/GU negative Renal ROSLab Results      Component                Value               Date                      NA                       137                 07/25/2024                K                        4.5                 07/25/2024  CO2                      20 (L)              07/25/2024                GLUCOSE                  123 (H)             07/25/2024                BUN                      19                  07/25/2024                CREATININE               1.06                07/25/2024                CALCIUM                   10.1                07/25/2024                EGFR                     93                  09/09/2021                GFRNONAA                 >60                 07/25/2024                Musculoskeletal  (+) Arthritis ,    Abdominal   Peds  Hematology   Anesthesia Other Findings   Reproductive/Obstetrics                              Anesthesia Physical Anesthesia Plan  ASA: 3  Anesthesia Plan: General   Post-op Pain Management:    Induction: Intravenous  PONV Risk Score and Plan: 3 and Ondansetron  and Dexamethasone   Airway Management Planned: Oral ETT  Additional Equipment: None  Intra-op Plan:   Post-operative Plan: Extubation in OR  Informed Consent: I have reviewed the patients History and Physical, chart, labs and discussed the procedure including the risks, benefits and alternatives for the proposed anesthesia with the patient or authorized representative who has indicated his/her understanding and acceptance.     Dental advisory given  Plan Discussed with: CRNA  Anesthesia Plan Comments:          Anesthesia Quick Evaluation

## 2024-08-01 NOTE — Anesthesia Procedure Notes (Signed)
 Procedure Name: Intubation Date/Time: 08/01/2024 11:21 AM  Performed by: Erick Fitz, CRNAPre-anesthesia Checklist: Patient identified, Emergency Drugs available, Suction available, Patient being monitored and Timeout performed Patient Re-evaluated:Patient Re-evaluated prior to induction Oxygen Delivery Method: Circle system utilized Preoxygenation: Pre-oxygenation with 100% oxygen Induction Type: IV induction Ventilation: Mask ventilation without difficulty Laryngoscope Size: Mac and 4 Grade View: Grade I Tube type: Oral Tube size: 7.5 mm Number of attempts: 1 Airway Equipment and Method: Stylet Placement Confirmation: ETT inserted through vocal cords under direct vision, positive ETCO2, CO2 detector and breath sounds checked- equal and bilateral Secured at: 22 cm Tube secured with: Tape (secured with white silk tape) Dental Injury: Teeth and Oropharynx as per pre-operative assessment

## 2024-08-01 NOTE — Op Note (Signed)
 08/01/2024  1:26 PM  PATIENT:  Marc Gates  73 y.o. male  Patient Care Team: Dwight Trula SQUIBB, MD as PCP - General (Internal Medicine) Sheldon Standing, MD as Consulting Physician (General Surgery) Mansouraty, Aloha Raddle., MD as Consulting Physician (Gastroenterology) O'Neal, Darryle Ned, MD as Consulting Physician (Cardiology) Mannam, Praveen, MD as Consulting Physician (Pulmonary Disease)  PRE-OPERATIVE DIAGNOSIS:  GASTRIC MASS, UMBILICAL HERNIA  POST-OPERATIVE DIAGNOSIS:  GASTRIC MASS UMBILICAL HERNIA  PROCEDURE:   GASTRECTOMY, PARTIAL, ROBOT-ASSISTED EGD (ESOPHAGOGASTRODUODENOSCOPY) TRANSVERSUS ABDOMINIS PLANE (TAP) BLOCK - BILATERAL PRIMARY REPAIR UMBILICAL HERNIA - 2.5 cm  SURGEON:  Standing KYM Sheldon, MD  ASSISTANT:  Bernarda Ned, MD  An experienced assistant was required given the standard of surgical care given the complexity of the case.  This assistant was needed for exposure, dissection, suction, tissue approximation, retraction, perception, etc  ANESTHESIA:  General endotracheal intubation anesthesia (GETA) and Regional TRANSVERSUS ABDOMINIS PLANE (TAP) nerve block -BILATERAL for perioperative & postoperative pain control at the level of the transverse abdominis & preperitoneal spaces along the flank at the anterior axillary line, from subcostal ridge to iliac crest under laparoscopic guidance provided with 30 mL of bupivicaine 0.25% with epinephrine   Estimated Blood Loss (EBL):   Total I/O In: 1000 [I.V.:900; IV Piggyback:100] Out: 30 [Blood:30].   (See anesthesia record)  Delay start of Pharmacological VTE agent (>24hrs) due to concerns of significant anemia, surgical blood loss, or risk of bleeding?:  no  DRAINS: (None)  SPECIMEN:  Stomach - distal anterior antral mass near the greater curvature  DISPOSITION OF SPECIMEN:  Pathology  COUNTS:  Sponge, needle, & instrument counts CORRECT at the conclusion of the case.      PLAN OF CARE: Admit for overnight  observation  PATIENT DISPOSITION:  PACU - hemodynamically stable.  INDICATION:   Patient followed by [facility] gastrology and found to have distal stomach mass.  Followed and increase in size.  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  Almost 2 cm but growing.  Options discussed.  Patient wished to be aggressive and proceed with resection.  I recommended a minimally invasive approach.  Patient with symptomatic umbilical hernia as well.  Recommended repair as well.  The anatomy & physiology of the foregut and anti-reflux mechanism was discussed.  The pathophysiology of Gastrointestinal stromal tumors (GISTs) and differential diagnosis was discussed.  Natural history risks without surgery was discussed.   The patient's symptoms are not adequately controlled by medicines and other non-operative treatments.  I feel the risks of no intervention will lead to serious problems that outweigh the operative risks; therefore, I recommended surgery to remove the mass.  Most likely involving partial gastrectomy wedge resection.  Need for a thorough workup to rule out the differential diagnosis and plan treatment was explained.  I explained MIS robotic/laparoscopic techniques with possible need for an open approach.  I will need to remove the mass en bloc.  Therefore I will need extraction incision.  Most likely through the umbilical hernia  Risks such as bleeding, infection, abscess, leak, injury to other organs, need for repair of tissues / organs, need for further treatment, heart attack, death, and other risks were discussed.   I noted a good likelihood this will help address the problem.  Goals of post-operative recovery were discussed as well.  Possibility that this will not correct all symptoms was explained.  Post-operative dysphagia, need for short-term liquid & pureed diet, possible need for medicines to help control symptoms in addition to surgery were discussed.  We will work to minimize  complications.  Possible need for Gleevec our Sutent postoperatively depending on the aggressiveness of the tumor was discussed as well.  That would involve medical oncology consultation.  Educational handouts further explaining the pathology, treatment options was given as well.  Questions were answered.  The patient expressed understanding & wished to proceed with surgery.   OR FINDINGS:   Fibrotic spherical gastric wall mass primarily endophytic/endoluminal along anterior greater curvature of the distal antrum.  21mm largest diameter.   Distal sleeve gastrectomy to excisie the region out.  Inner staple line grossly negative.  Specimen opened and scalloped in half along anterior greater curvature to confirm fibrotic well-circumscribed mass with grossly negative margins.  No evidence of any metastatic disease or other concerns.  No evidence of any persistent gastric mass by EGD.  Some narrowing of the antrum with a rather J-shaped stomach along the lesser curvature.  No evidence of leak nor obstruction narrowing by endoscopy.  Caliber wider than pylorus.  2.5 cm umbilical hernia.  Specimen removed through this and primary suture hernia repair done.  DESCRIPTION:   Informed consent was confirmed.  The patient received IV antibiotics prior to incision.  The underwent general anesthesia without difficulty.  A Foley catheter sterilely placed.  The patient was positioned in split leg with arms tucked. The abdomen was prepped and draped in the sterile fashion.  Surgical time-out confirmed our plan.  I placed a 5 mm port in the left subcostal region using Varess entry technique with the patient in steep reverse Trendelenburg and left side up.  Entry was clean.  We induced carbon dioxide insufflation.  Camera inspection revealed no injury.  We could see tattooing of the distal antrum and the area of concern.  Under direct visualization, I placed extra ports.  I also placed a 5 mm port in the left subxiphoid  region under direct visualization.  I removed that and placed an Omega-shaped rigid Nathanson liver retractor to lift the left lateral sector of the liver anteriorly to expose the esophageal hiatus.  This was secured to the bed using the iron man system.  The Xi robot was carefully docked and instruments placed and advanced under direct visualization.  We focused on dissection.  Palpated confirmed a fibrotic fixed mass on the distal greater curvature of the stomach in the antrum more anteriorly than posteriorly.  It was a pink epicenter around the region of tattooing.  This correlated with CT scan findings.  I gone into the lesser sac along the greater curvature to the stomach starting from the proximal antrum more distally to help elevate and get into the lesser sac.  This allowed to see the posterior wall which was not involved.  We left blood supply along the lesser curvature.  Tested and it felt like we could pinch off and do a modified distal sleeve gastrectomy to incorporate the mass with stapling.  We then proceeded with using 60 mm green load robotic staplers.  Came distally proximal to the pylorus and did a distal sleeve like gastrectomy taking care to spare 5 cm of stomach along the lesser curvature of the distal antrum.   Then chose an area in the proximal antrum and did a sleeve gastrectomy along there and met in the middle.  Took care to make sure we found likely grossly got around the mass.  Tried to take more of the anterior wall and posterior wall since that seem to be where the mass was situated.  Hemostasis is good.  We placed a wound protector through his umbilical hernia where the stapler port had been.  Remove the specimen.  Felt a firm fibrotic mass.  I ended up opening the specimen away from the staple line since I knew that was the margin.  Came through a 2 cm spherical fibrotic mass that seem consistent with a gastrointestinal stromal tumor.  Grossly margins were negative.  Closest along  the staple line.  We did inspection and confirm good hemostasis.  There was some narrowing.    We laparoscopically clamped off the ligament of Treitz and I did an EGD.  Came down a somewhat patulous esophagus into the stomach.  He had a rather J-shaped stomach so intubation of the antrum was a little challenging.  However on retroflexion view and pulling up like a shepherd's hook up was able to intubate the distal antrum along the lesser curvature and prove a 5 cm diameter lumen.  More dilated than the pylorus.  Pylorus was clear.  The proximal antrum had somewhat of a triangular corner along the greater curvature externally but internally dilated well and seemed smooth.  No compromise.  Did inspection and hemostasis was good.  I saw no residual masses.  There was no leaking of the any staple lines and mucosa looked viable.  Aspirated gas and removed the EGD.  I scrubbed back in reinspection.  Distal antrum narrow but quite patent.  Hemostasis good.  We saw no evidence of any leak or perforation or other abnormality.  I removed the [facility] liver retractor under direct visualization.  I evacuated carbon dioxide and removed the ports.  One retractor removed.  I closed the umbilical hernia transversely using #1 PDS fashion.  Tacked the umbilical stalk down with Vicryl suture.  The periumbilical wound and port sites were closed with Monocryl and sterile dressings applied.  The patient is being extubated and brought back to the recovery room.  I discussed postop care in detail with the patient and family in in the office.  Discussed again with the patient and his close friend, Garnette Lesches, in the holding area.  I discussed operative findings, updated the patient's status, discussed probable steps to recovery, and gave postoperative recommendations to the patient's friend, Elspeth Lesches.  Recommendations were made.  Questions were answered.  He expressed understanding & appreciation.    Elspeth KYM Schultze,  M.D., F.A.C.S. Gastrointestinal and Minimally Invasive Surgery [facility] [address] [place], [place] [address] [phone] Main / Paging

## 2024-08-01 NOTE — Anesthesia Postprocedure Evaluation (Signed)
 Anesthesia Post Note  Patient: Marc Gates  Procedure(s) Performed: GASTRECTOMY, PARTIAL, ROBOT-ASSISTED EGD (ESOPHAGOGASTRODUODENOSCOPY) REPAIR, HERNIA, UMBILICAL, ADULT     Patient location during evaluation: PACU Anesthesia Type: General Level of consciousness: awake and alert Pain management: pain level controlled Vital Signs Assessment: post-procedure vital signs reviewed and stable Respiratory status: spontaneous breathing, nonlabored ventilation and respiratory function stable Cardiovascular status: blood pressure returned to baseline and stable Postop Assessment: no apparent nausea or vomiting Anesthetic complications: no   There were no known notable events for this encounter.                Shekita Boyden

## 2024-08-01 NOTE — H&P (Signed)
 08/01/2024    PATIENT NAME: Marc Gates MRN: [id number] DOB: Oct 29, 1951 PHYSICIANS:  REFERRING PHYSICIAN: Aloha Wilhelmenia Raddle.*  CARE TEAM:  Patient Care Team: Dwight Ave, MD as PCP - General (Internal Medicine)  CONSULTING PROVIDER: ELSPETH JUDAH SCHULTZE, MD  SUBJECTIVE   Chief Complaint: New Consultation ( gastric nodule)   Marc Gates is a 73 y.o. male  who is seen today as an office consultation  at the request of DrRONITA Aloha Mansouraty for evaluation of gastric mass   History of Present Illness:  Pleasant gentleman. Followed by gastroenterology. History of Barrett's esophagus on PPIs. Underwent endoscopy. Barrett's without dysplasia. No H. pylori. However nodule noted along the lesser curvature of stomach in 2024. Biopsy is not consistent with malignancy. Recommended endoscopic ultrasound. Repeat done 1 year later. Nodule increased from 16 to 18 mm in size. Felt to be a subepithelial lesion somewhat hypoechoic but not a definite cyst. Discussed at tumor board about options. Not felt to be a great candidate for endoluminal resection, so surgical consultation offered.  Patient comes today by himself. He had an appendectomy without any other abdominal surgery. Usually moves his bowels every other day. He did have a bad pneumonia requiring empyema drainage and thoracotomy 4 years ago but no other issues. He claims he walks 30 to 45 minutes a day. He does have some diabetes on metformin  and Ozempic  GLP-1 inhibitor. Denies any rectal bleeding.  Medical History:  Past Medical History:  Diagnosis Date  Allergies  Anxiety  CPAP (continuous positive airway pressure) dependence  Diabetes mellitus without complication (CMS/HHS-HCC)  Edema of both lower extremities  Empyema (CMS/HHS-HCC)  GERD (gastroesophageal reflux disease)  Hepatitis  High cholesterol  Hypertension  Joint pain  Pneumonia  Sleep apnea  SOB (shortness of breath)   Patient Active Problem List   Diagnosis  Mass of stomach  Umbilical hernia without obstruction and without gangrene  Diastasis recti   Past Surgical History:  Procedure Laterality Date  Colonoscopy with propofol  (N/A) 08/13/2013  Ir thoracentesis asp pleural space w/img guide 03/07/2019  THORACOTOMY Right 04/03/2019  Empyema drainage Right 04/03/2019  DECORTICATION LUNG Right 04/03/2019  Pain pump implantation 04/03/2019  Left heart cath and coronary angiography (N/A) 04/06/2021  .EUS 07/31/2023  Esophagogastroduodenoscopy (N/A) 07/31/2023  .Fine needle aspiration (N/A) 07/31/2023  COLONOSCOPY 05/02/2024  Esophagogastroduodenoscopy (N/A) 05/02/2024  .Fine needle aspiration biopsy 05/02/2024  .EUS 05/02/2024  Submucosal injection 05/02/2024  APPENDECTOMY  Eye surgery (Left)    No Known Allergies  Current Outpatient Medications on File Prior to Visit  Medication Sig Dispense Refill  ALPRAZolam  (XANAX ) 0.25 MG tablet Take 0.25 mg by mouth 2 (two) times daily as needed for Anxiety  amLODIPine -valsartan  (EXFORGE ) 5-160 mg tablet Take 1 tablet by mouth once daily  blood glucose diagnostic (ACCU-CHEK GUIDE TEST STRIPS) test strip 2 (two) times daily  ergocalciferol , vitamin D2, 1,250 mcg (50,000 unit) capsule Take 50,000 Units by mouth every 14 (fourteen) days  famotidine  (PEPCID ) 20 MG tablet Take 20 mg by mouth 2 (two) times daily  finasteride  (PROSCAR ) 5 mg tablet Take 5 mg by mouth once daily  levocetirizine (XYZAL ) 5 MG tablet Take 5 mg by mouth every evening  meloxicam (MOBIC) 15 MG tablet Take 15 mg by mouth once daily as needed  metFORMIN  (GLUCOPHAGE -XR) 500 MG XR tablet Take 1,000 mg by mouth 2 (two) times daily with meals  montelukast  (SINGULAIR ) 10 mg tablet Take 10 mg by mouth at bedtime  MYRBETRIQ  50 mg ER tablet Take 50 mg by  mouth once daily  OZEMPIC  2 mg/dose (8 mg/3 mL) pen injector Inject 2 mg subcutaneously once a week  pantoprazole  (PROTONIX ) 40 MG DR tablet Take 40 mg by mouth 2 (two)  times daily  tadalafiL  (CIALIS ) 5 MG tablet TAKE 1 TABLET BY MOUTH EVERY DAY AS NEEDED FOR URINARY AND SEXUAL DYSFUNCTION  zolpidem  (AMBIEN ) 10 mg tablet Take 10 mg by mouth at bedtime as needed   No current facility-administered medications on file prior to visit.   Family History  Problem Relation Age of Onset  Anxiety Mother  Obesity Mother  Hyperlipidemia (Elevated cholesterol) Mother  High blood pressure (Hypertension) Mother  Hyperlipidemia (Elevated cholesterol) Father  Diabetes Father  Cancer Father  Seizures Brother  Colon cancer Neg Hx  Esophageal cancer Neg Hx  Stomach cancer Neg Hx    Social History   Tobacco Use  Smoking Status Former  Types: Cigarettes  Passive exposure: Past  Smokeless Tobacco Never    Social History   Socioeconomic History  Marital status: Single  Tobacco Use  Smoking status: Former  Types: Cigarettes  Passive exposure: Past  Smokeless tobacco: Never  Vaping Use  Vaping status: Never Used  Substance and Sexual Activity  Alcohol use: Never  Drug use: Never  Sexual activity: Defer   Social Drivers of Health   Food Insecurity: Low Risk (12/16/2023)  Received from [facility]  Hunger Vital Sign  Within the past 12 months, you worried that your food would run out before you got money to buy more: Never true  Within the past 12 months, the food you bought just didn't last and you didn't have money to get more. : Never true  Transportation Needs: No Transportation Needs (12/16/2023)  Received from [employer]  In the past 12 months, has lack of reliable transportation kept you from medical appointments, meetings, work or from getting things needed for daily living? : No  Housing Stability: Unknown (06/24/2024)  Housing Stability Vital Sign  Homeless in the Last Year: No   ############################################################  Review of Systems: A complete review of systems (ROS) was obtained from the  patient.  We have reviewed this information and discussed as appropriate with the patient.  See HPI as well for other pertinent ROS.  Constitutional: No fevers, chills, sweats. Weight stable Eyes: No vision changes, No discharge HENT: No sore throats, nasal drainage Lymph: No neck swelling, No bruising easily Pulmonary: No cough, productive sputum CV: No orthopnea, PND . No exertional chest/neck/shoulder/arm pain. Patient can walk 2-3 miles without difficulty.   GI: No personal nor family history of GI/colon cancer, inflammatory bowel disease, irritable bowel syndrome, allergy such as Celiac Sprue, dietary/dairy problems, colitis, ulcers nor gastritis. No recent sick contacts/gastroenteritis. No travel outside the country. No changes in diet.  Renal: No UTIs, No hematuria Genital: No drainage, bleeding, masses Musculoskeletal: No severe joint pain. Good ROM major joints Skin: No sores or lesions Heme/Lymph: No easy bleeding. No swollen lymph nodes Neuro: No active seizures. No facial droop Psych: No hallucinations. No agitation  OBJECTIVE   Vitals:  06/24/24 1107  BP: 138/88  Pulse: 86  Temp: 36.8 C (98.3 F)  TempSrc: Temporal  SpO2: 95%  Weight: (!) 112.8 kg (248 lb 9.6 oz)  Height: 177.8 cm (5' 10)  PainSc: 0-No pain   Body mass index is 35.67 kg/m.  PHYSICAL EXAM:  Constitutional: Not cachectic. Hygeine adequate. Vitals signs as above.  Eyes: No glasses. Vision adequate,Pupils reactive, normal extraocular movements. Sclera nonicteric Neuro:  CN II-XII intact. No major focal sensory defects. No major motor deficits. Lymph: No head/neck/groin lymphadenopathy Psych: No severe agitation. No severe anxiety. Judgment & insight Adequate, Oriented x4, HENT: Normocephalic, Mucus membranes moist. No thrush. Hearing: adequate Neck: Supple, No tracheal deviation. No obvious thyromegaly Chest: No pain to chest wall compression. Good respiratory excursion. No audible  wheezing CV: Pulses intact. regular. No major extremity edema Ext: No obvious deformity or contracture. Edema: Not present. No cyanosis Skin: No major subcutaneous nodules. Warm and dry Musculoskeletal: Severe joint rigidity not present. No obvious clubbing. No digital petechiae. Mobility: no assist device moving easily without restrictions  Abdomen: Obese Soft. Nondistended. Nontender. Hernia: Present at: umbilicus, size 2x2cm. Diastasis recti: Mild supraumbilical midline. No hepatomegaly. No splenomegaly.  Genital/Pelvic: Inguinal hernia: Not present. Inguinal lymph nodes: without lymphadenopathy nor hidradenitis.   Rectal: (Deferred)  PE Chaperone note: Marc Gates, CMA, was included in the room as chaperone for sensitive portions of the exam   ###################################################################  Labs, Imaging and Diagnostic Testing:  Located in 'Care Everywhere' section of Epic EMR chart  PRIOR [facility] CLINIC NOTES:  Not applicable  SURGERY NOTES:  Not applicable  PATHOLOGY:  Located in 'Care Everywhere' section of Epic EMR chart  Assessment and Plan:  DIAGNOSES:  Diagnoses and all orders for this visit:  Mass of stomach  Umbilical hernia without obstruction and without gangrene  Diastasis recti    ASSESSMENT/PLAN  Pleasant male with gastric wall mass that is small but has not increased in size on follow-up EUS this year. 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 No definite evidence of malignancy. Looks like there may be something more on the anterior distal antrum by CAT scan but Dr. Wilhelmenia favors greater curvature.  Given the fact it is gotten larger in a year, reasonable to consider resection. Discussed with Dr. Aron, one of my surgical oncology partners, who agrees. He would be a good candidate for robotic wedge resection of the mass versus excision and suture closure depending on location. May need interoperative EGD to help localize it  although the mass has been biopsied by Dr. Wilhelmenia. That would allow to catch it early and make sure there are no other concerning features.  After extensive discussion and answering questions, he wishes to be aggressive and proceed with gastric wedge resection with robotic approach to allow minimally invasive resection.  Not a fan of continued observation.   This would allow me to remove the mass through his umbilical hernia and primarily repair that. He does have diastases and obesity so there is a risk of recurrence but hopefully not too high. He is interested in proceeding.   The anatomy & physiology of the foregut and anti-reflux mechanism was discussed. The pathophysiology of Gastrointestinal stromal tumors (GISTs) and differential diagnosis was discussed. Natural history risks without surgery was discussed. The patient's symptoms are not adequately controlled by medicines and other non-operative treatments. I feel the risks of no intervention will lead to serious problems that outweigh the operative risks; therefore, I recommended surgery to remove the mass. Most likely involving partial gastrectomy wedge resection. Need for a thorough workup to rule out the differential diagnosis and plan treatment was explained. I explained MIS robotic/laparoscopic techniques with possible need for an open approach. I will need to remove the mass en bloc. Therefore I will need extraction incision. Possible hand assist port as well.  Risks such as bleeding, infection, abscess, leak, injury to other organs, need for repair of tissues / organs, need for further treatment,  heart attack, death, and other risks were discussed. I noted a good likelihood this will help address the problem. Goals of post-operative recovery were discussed as well. Possibility that this will not correct all symptoms was explained. Post-operative dysphagia, need for short-term liquid & pureed diet, possible need for medicines to help control  symptoms in addition to surgery were discussed. We will work to minimize complications. Possible need for Gleevec our Sutent postoperatively depending on the aggressiveness of the tumor was discussed as well. That would involve medical oncology consultation. Educational handouts further explaining the pathology, treatment options was given as well. Questions were answered. The patient expressed understanding & wished to proceed with surgery.    Elspeth KYM Schultze, MD, FACS, MASCRS Esophageal, Gastrointestinal & Colorectal Surgery Robotic and Minimally Invasive Surgery  [facility] [address] [place], [place] [address] [phone] Fax [phone] Main  CONTACT INFORMATION: Weekday (9AM-5PM): Call [facility] main office at [phone] Weeknight (5PM-9AM) or Weekend/Holiday: Check EPIC Web Links tab & use AMION (password  [email or web address]) for General Surgery [facility] coverage  Please, DO NOT use SecureChat  (it is not reliable communication to reach operating surgeons & will lead to a delay in care).   Epic staff messaging available for outptient concerns needing 1-2 business day response.     08/01/2024

## 2024-08-01 NOTE — Interval H&P Note (Signed)
 History and Physical Interval Note:  08/01/2024 10:07 AM  Marc Gates  has presented today for surgery, with the diagnosis of GASTRIC MASS, UMBILICAL HERNIA.  The various methods of treatment have been discussed with the patient and family. After consideration of risks, benefits and other options for treatment, the patient has consented to  Procedure(s): GASTRECTOMY, PARTIAL, ROBOT-ASSISTED (N/A) EGD (ESOPHAGOGASTRODUODENOSCOPY) (N/A) REPAIR, HERNIA, UMBILICAL, ADULT (N/A) as a surgical intervention.  The patient's history has been reviewed, patient examined, no change in status, stable for surgery.  I have reviewed the patient's chart and labs.  Questions were answered to the patient's satisfaction.    I have re-reviewed the the patient's records, history, medications, and allergies.  I have re-examined the patient.  I again discussed intraoperative plans and goals of post-operative recovery.  The patient agrees to proceed.  Marc Gates  06-13-1951 [id number]  Patient Care Team: Dwight Trula SQUIBB, MD as PCP - General (Internal Medicine) Sheldon Standing, MD as Consulting Physician (General Surgery) Mansouraty, Aloha Raddle., MD as Consulting Physician (Gastroenterology) O'Neal, Darryle Ned, MD as Consulting Physician (Cardiology) Theophilus Roosevelt, MD as Consulting Physician (Pulmonary Disease)  Patient Active Problem List   Diagnosis Date Noted   Gastritis without bleeding 05/02/2024   Special screening for malignant neoplasms, colon 02/19/2024   Gastritis and gastroduodenitis 07/31/2023   Gastric nodule 04/27/2023   Barrett's esophagus without dysplasia 04/27/2023   Gastroesophageal reflux disease 04/27/2023   Chronic cough 04/27/2023   Diverticulosis of colon without hemorrhage 04/27/2023   Vitamin D  deficiency 07/25/2022   Other hyperlipidemia 07/25/2022   CAD (coronary artery disease) 04/06/2021   Abnormal nuclear stress test 04/06/2021   Class 1 obesity due to excess calories with  body mass index (BMI) of 33.0 to 33.9 in adult 10/03/2020   Hyperlipidemia associated with type 2 diabetes mellitus (HCC) 05/28/2020   Hypertension associated with type 2 diabetes mellitus (HCC) 05/28/2020   OSA on CPAP 05/28/2020   S/P thoracotomy 04/03/2019   Loculated pleural effusion 03/29/2019    Past Medical History:  Diagnosis Date   Allergies    Anxiety    Arthritis    Coronary artery disease    CPAP (continuous positive airway pressure) dependence    Edema of both lower extremities    Empyema (HCC)    GERD (gastroesophageal reflux disease)    Hepatitis 1980'S   HEPATITIS B   High cholesterol    Hypertension    Joint pain    Pneumonia    Pre-diabetes    Sleep apnea    CPAP   SOB (shortness of breath)     Past Surgical History:  Procedure Laterality Date   APPENDECTOMY  AGE 18 OR 13   BIOPSY  07/31/2023   Procedure: BIOPSY;  Surgeon: Wilhelmenia Aloha Raddle., MD;  Location: [facility];  Service: Gastroenterology;;   COLONOSCOPY N/A 05/02/2024   Procedure: COLONOSCOPY;  Surgeon: Wilhelmenia Aloha Raddle., MD;  Location: [facility];  Service: Gastroenterology;  Laterality: N/A;   COLONOSCOPY WITH PROPOFOL  N/A 08/13/2013   Procedure: COLONOSCOPY WITH PROPOFOL ;  Surgeon: Gladis MARLA Louder, MD;  Location: [facility];  Service: Endoscopy;  Laterality: N/A;   DECORTICATION Right 04/03/2019   Procedure: DECORTICATION OF RIGHT LUNG;  Surgeon: Lucas Dorise MARLA, MD;  Location: [facility];  Service: Thoracic;  Laterality: Right;   EMPYEMA DRAINAGE Right 04/03/2019   Procedure: EMPYEMA DRAINAGE;  Surgeon: Lucas Dorise MARLA, MD;  Location: [facility];  Service: Thoracic;  Laterality: Right;   ESOPHAGOGASTRODUODENOSCOPY N/A 07/31/2023  Procedure: ESOPHAGOGASTRODUODENOSCOPY (EGD);  Surgeon: Wilhelmenia Aloha Raddle., MD;  Location: [facility];  Service: Gastroenterology;  Laterality: N/A;   ESOPHAGOGASTRODUODENOSCOPY N/A 05/02/2024   Procedure: EGD (ESOPHAGOGASTRODUODENOSCOPY);  Surgeon:  Wilhelmenia Aloha Raddle., MD;  Location: [facility];  Service: Gastroenterology;  Laterality: N/A;   EUS N/A 07/31/2023   Procedure: UPPER ENDOSCOPIC ULTRASOUND (EUS) RADIAL;  Surgeon: Wilhelmenia Aloha Raddle., MD;  Location: [facility];  Service: Gastroenterology;  Laterality: N/A;   EUS N/A 05/02/2024   Procedure: ULTRASOUND, UPPER GI TRACT, ENDOSCOPIC;  Surgeon: Wilhelmenia Aloha Raddle., MD;  Location: [facility];  Service: Gastroenterology;  Laterality: N/A;   EYE SURGERY Left    retinal detachment and cataract surgery   FINE NEEDLE ASPIRATION N/A 07/31/2023   Procedure: FINE NEEDLE ASPIRATION (FNA) LINEAR;  Surgeon: Wilhelmenia Aloha Raddle., MD;  Location: [facility];  Service: Gastroenterology;  Laterality: N/A;   FINE NEEDLE ASPIRATION BIOPSY  05/02/2024   Procedure: FINE NEEDLE ASPIRATION BIOPSY;  Surgeon: Wilhelmenia, Aloha Raddle., MD;  Location: [facility];  Service: Gastroenterology;;   INGUINAL HERNIA REPAIR Left    when he was an infant   IR THORACENTESIS ASP PLEURAL SPACE W/IMG GUIDE  03/07/2019   LEFT HEART CATH AND CORONARY ANGIOGRAPHY N/A 04/06/2021   Procedure: LEFT HEART CATH AND CORONARY ANGIOGRAPHY;  Surgeon: Swazil[place], Peter M, MD;  Location: [facility] IN[facility];  Service: Cardiovascular;  Laterality: N/A;   PAIN PUMP IMPLANTATION  04/03/2019   Procedure: PLACEMENT OF ON-Q PAIN PUMP;  Surgeon: Lucas Dorise POUR, MD;  Location: [facility];  Service: Thoracic;;   SUBMUCOSAL INJECTION  05/02/2024   Procedure: INJECTION, SUBMUCOSAL;  Surgeon: Wilhelmenia Aloha Raddle., MD;  Location: [facility];  Service: Gastroenterology;;   THORACOTOMY Right 04/03/2019   Procedure: THORACOTOMY MAJOR;  Surgeon: Lucas Dorise POUR, MD;  Location: [facility];  Service: Thoracic;  Laterality: Right;    Social History   Socioeconomic History   Marital status: Single    Spouse name: Not on file   Number of children: 0   Years of education: Not on file   Highest education level: Not on file   Occupational History   Occupation: Retired Audiological scientist Professor  Tobacco Use   Smoking status: Former    Current packs/day: 1.50    Average packs/day: 1.5 packs/day for 15.0 years (22.5 ttl pk-yrs)    Types: Cigarettes    Passive exposure: Past   Smokeless tobacco: Never  Vaping Use   Vaping status: Never Used  Substance and Sexual Activity   Alcohol use: Yes    Comment: rare   Drug use: No    Comment: QUIT SMOKING 25 YRS AGO   Sexual activity: Not Currently  Other Topics Concern   Not on file  Social History Narrative   Not on file   Social Drivers of Health   Financial Resource Strain: Not on file  Food Insecurity: Low Risk  (12/16/2023)   Received from [facility]   Hunger Vital Sign    Within the past 12 months, you worried that your food would run out before you got money to buy more: Never true    Within the past 12 months, the food you bought just didn't last and you didn't have money to get more. : Never true  Transportation Needs: No Transportation Needs (12/16/2023)   Received from [facility]    In the past 12 months, has lack of reliable transportation kept you from medical appointments, meetings, work or from getting things needed for  daily living? : No  Physical Activity: Not on file  Stress: Not on file  Social Connections: Not on file  Intimate Partner Violence: Not on file    Family History  Problem Relation Age of Onset   High blood pressure Mother    High Cholesterol Mother    Heart disease Mother    Anxiety disorder Mother    Obesity Mother    Diabetes Father    High Cholesterol Father    Cancer Father    Seizures Brother    Colon cancer Neg Hx    Esophageal cancer Neg Hx    Stomach cancer Neg Hx     Medications Prior to Admission  Medication Sig Dispense Refill Last Dose/Taking   albuterol  (VENTOLIN  HFA) 108 (90 Base) MCG/ACT inhaler Inhale 1-2 puffs into the lungs every 4 (four) hours as needed for shortness of breath.    Taking As Needed   ALPRAZolam  (XANAX ) 0.25 MG tablet Take 1 tablet (0.25 mg total) by mouth 2 (two) times daily as needed for anxiety. 30 tablet 0 Taking As Needed   amLODipine -valsartan  (EXFORGE ) 5-160 MG tablet Take 1 tablet by mouth daily.   Taking   aspirin  EC 81 MG tablet Take 81 mg by mouth daily.   Taking   atorvastatin  (LIPITOR) 20 MG tablet Take 20 mg by mouth every evening.   Taking   azelastine  (ASTELIN ) 0.1 % nasal spray Place 1 spray into both nostrils 2 (two) times daily as needed for rhinitis. Use in each nostril as directed   Taking As Needed   EPINEPHrine  0.3 mg/0.3 mL IJ SOAJ injection Inject 0.3 mg into the muscle as needed for anaphylaxis.   Taking As Needed   famotidine  (PEPCID ) 20 MG tablet Take 20 mg by mouth 2 (two) times daily.   Taking   finasteride  (PROSCAR ) 5 MG tablet Take 5 mg by mouth daily.   Taking   fluticasone  (FLONASE ) 50 MCG/ACT nasal spray Place 2 sprays into both nostrils daily as needed for allergies.   Taking As Needed   levocetirizine (XYZAL ) 5 MG tablet Take 5 mg by mouth every evening.   Taking   meloxicam (MOBIC) 15 MG tablet Take 15 mg by mouth daily.   Taking   metFORMIN  (GLUCOPHAGE -XR) 500 MG 24 hr tablet Take 2 tablets (1,000 mg total) by mouth 2 (two) times daily.   Taking   mirabegron  ER (MYRBETRIQ ) 50 MG TB24 tablet Take 50 mg by mouth daily.   Taking   montelukast  (SINGULAIR ) 10 MG tablet Take 10 mg by mouth at bedtime.   Taking   Multiple Vitamin (MULTIVITAMIN WITH MINERALS) TABS tablet Take 1 tablet by mouth daily.   Taking   OZEMPIC , 2 MG/DOSE, 8 MG/3ML SOPN Inject 2 mg into the skin once a week.   Taking   pantoprazole  (PROTONIX ) 40 MG tablet Take 40 mg by mouth 2 (two) times daily.    Taking   tadalafil  (CIALIS ) 5 MG tablet Take 5 mg by mouth daily.   Taking   Vitamin D , Ergocalciferol , (DRISDOL ) 1.25 MG (50000 UNIT) CAPS capsule TAKE 1 CAPSULE BY MOUTH EVERY 14 DAYS 6 capsule 0 Taking   zolpidem  (AMBIEN ) 10 MG tablet Take 10 mg by mouth  at bedtime.    Taking   benzonatate  (TESSALON ) 200 MG capsule Take 200 mg by mouth 3 (three) times daily as needed for cough.      glucose blood test strip 1 each by Other route as needed for other. Use as  instructed      ondansetron  (ZOFRAN ) 4 MG tablet Take 4 mg by mouth every 8 (eight) hours as needed for nausea or vomiting.       Current Facility-Administered Medications  Medication Dose Route Frequency Provider Last Rate Last Admin   acetaminophen  (TYLENOL ) 500 MG tablet            bupivacaine  liposome (EXPAREL ) 1.3 % injection 266 mg  20 mL Infiltration Once Sheldon Standing, MD       cefTRIAXone  (ROCEPHIN ) 2 g in sodium chloride  0.9 % 100 mL IVPB  2 g Intravenous On Call to OR Sheldon Standing, MD       chlorhexidine  (PERIDEX ) 0.12 % solution 15 mL  15 mL Mouth/Throat Once Jerrye Sharper, MD       Or   Oral care mouth rinse  15 mL Mouth Rinse Once Jerrye Sharper, MD       Chlorhexidine  Gluconate Cloth 2 % PADS 6 each  6 each Topical Once Sheldon Standing, MD       And   Chlorhexidine  Gluconate Cloth 2 % PADS 6 each  6 each Topical Once Sheldon Standing, MD       dexamethasone  (DECADRON ) injection 4 mg  4 mg Intravenous On Call to OR Sheldon Standing, MD       NOREEN ON 08/02/2024] feeding supplement (ENSURE PRE-SURGERY) liquid 296 mL  296 mL Oral Once Sheldon Standing, MD       feeding supplement (ENSURE PRE-SURGERY) liquid 592 mL  592 mL Oral Once Sheldon Standing, MD       gabapentin  (NEURONTIN ) 300 MG capsule            lactated ringers  infusion   Intravenous Continuous Jerrye Sharper, MD       sodium chloride  0.9 % with cefTRIAXone  (ROCEPHIN ) ADS Med              No Known Allergies  BP (!) 153/89   Pulse 70   Temp 98.1 F (36.7 C) (Oral)   Resp 20   SpO2 96%   Labs: Results for orders placed or performed during the hospital encounter of 08/01/24 (from the past 48 hours)  Glucose, capillary     Status: Abnormal   Collection Time: 08/01/24  9:35 AM  Result Value Ref Range    Glucose-Capillary 147 (H) 70 - 99 mg/dL    Comment: Glucose reference range applies only to samples taken after fasting for at least 8 hours.   Comment 1 Notify RN     Imaging / Studies: No results found.   Briant KYM Sheldon, M.D., F.A.C.S. Gastrointestinal and Minimally Invasive Surgery [facility] [address], [address] [place], [place] [address] [phone] Main / Paging  08/01/2024 10:07 AM    Standing JAYSON Sheldon

## 2024-08-02 ENCOUNTER — Encounter (HOSPITAL_COMMUNITY): Payer: Self-pay | Admitting: Surgery

## 2024-08-02 ENCOUNTER — Inpatient Hospital Stay (HOSPITAL_COMMUNITY)

## 2024-08-02 LAB — GLUCOSE, CAPILLARY: Glucose-Capillary: 128 mg/dL — ABNORMAL HIGH (ref 70–99)

## 2024-08-02 NOTE — Discharge Summary (Signed)
 Physician Discharge Summary    Marc Gates MRN: [id number] DOB/AGE: 03/27/1951 = 73 y.o.  Patient Care Team: Dwight Trula SQUIBB, MD as PCP - General (Internal Medicine) Sheldon Standing, MD as Consulting Physician (General Surgery) Mansouraty, Aloha Raddle., MD as Consulting Physician (Gastroenterology) O'Neal, Darryle Ned, MD as Consulting Physician (Cardiology) Mannam, Praveen, MD as Consulting Physician (Pulmonary Disease)  Admit date: 08/01/2024  Discharge date: 08/02/2024  Hospital Stay = 1 days    Discharge Diagnoses:  Principal Problem:   Gastric nodule Active Problems:   OSA on CPAP   Gastroesophageal reflux disease   Gastric mass   1 Day Post-Op  08/01/2024  POST-OPERATIVE DIAGNOSIS:  GASTRIC MASS UMBILICAL HERNIA   PROCEDURE:   GASTRECTOMY, PARTIAL, ROBOT-ASSISTED EGD (ESOPHAGOGASTRODUODENOSCOPY) TRANSVERSUS ABDOMINIS PLANE (TAP) BLOCK - BILATERAL PRIMARY REPAIR UMBILICAL HERNIA - 2.5 cm   SURGEON:  Standing KYM Sheldon, MD  OR FINDINGS:   Fibrotic spherical gastric wall mass primarily endophytic/endoluminal along anterior greater curvature of the distal antrum.  21mm largest diameter.   Distal sleeve gastrectomy to excisie the region out.  Inner staple line grossly negative.  Specimen opened and scalloped in half along anterior greater curvature to confirm fibrotic well-circumscribed mass with grossly negative margins.  No evidence of any metastatic disease or other concerns.   No evidence of any persistent gastric mass by EGD.  Some narrowing of the antrum with a rather J-shaped stomach along the lesser curvature.  No evidence of leak nor obstruction narrowing by endoscopy.  Caliber wider than pylorus.   2.5 cm umbilical hernia.  Specimen removed through this and primary suture hernia repair done.  Consults: Anesthesia  Hospital Course:   The patient underwent the surgery above.  Postoperatively, the patient gradually mobilized and advanced on his diet.  Pain and  other symptoms were treated aggressively.  Patient underwent upper GI on postop day 1: No leak or obstruction.  Advance to a soft diet.  By the time of discharge, the patient was walking well the hallways, eating food, having flatus.  Pain was well-controlled on an oral medications.  Based on meeting discharge criteria and continuing to recover, I felt it was safe for the patient to be discharged from the hospital to further recover with close followup. Postoperative recommendations were discussed in detail.  They are written as well.  Discharged Condition: good  Discharge Exam: Blood pressure (!) 156/78, pulse 67, temperature 97.7 F (36.5 C), temperature source Oral, resp. rate 18, height 5' 10 (1.778 m), weight 110.7 kg, SpO2 98%.  General: Pt awake/alert/oriented x4 in No acute distress.  Smiling.  Bright alert.  Inquisitive. Eyes: PERRL, normal EOM.  Sclera clear.  No icterus Neuro: CN II-XII intact w/o focal sensory/motor deficits. Lymph: No head/neck/groin lymphadenopathy Psych:  No delerium/psychosis/paranoia HENT: Normocephalic, Mucus membranes moist.  No thrush Neck: Supple, No tracheal deviation Chest: No chest wall pain w good excursion CV:  Pulses intact.  Regular rhythm MS: Normal AROM mjr joints.  No obvious deformity Abdomen: Soft.  Nondistended.  Mildly tender at incisions only.  No evidence of peritonitis.  No incarcerated hernias. Ext:  SCDs BLE.  No mjr edema.  No cyanosis Skin: No petechiae / purpura   Disposition:    Follow-up Information     Sheldon Standing, MD Follow up on 08/20/2024.   Specialties: General Surgery, Colon and Rectal Surgery Why: To follow up after your operation Contact information: [address] [place] [address] [phone]  Discharge Instructions     Call MD for:   Complete by: As directed    Temperature > 101.75F   Call MD for:  extreme fatigue   Complete by: As directed    Call MD  for:  hives   Complete by: As directed    Call MD for:  persistant nausea and vomiting   Complete by: As directed    Call MD for:  redness, tenderness, or signs of infection (pain, swelling, redness, odor or green/yellow discharge around incision site)   Complete by: As directed    Call MD for:  severe uncontrolled pain   Complete by: As directed    Diet general   Complete by: As directed    SEE ESOPHAGEAL SURGERY DIET INSTRUCTIONS  We using usually start you out on a pureed (blenderized) diet. Expect some sticking with swallowing over the next 1-2 months.   This is due to swelling around your esophagus at the wrap & hiatal diaphragm repair.  It will gradually ease off over the next few months.   Discharge instructions   Complete by: As directed    Please see discharge instruction sheets.   Also refer to any handouts/printouts that may have been given from the [facility] surgery office (if you visited us  there before surgery) Please call our office if you have any questions or concerns [phone]   Driving Restrictions   Complete by: As directed    No driving until off narcotics and can safely swerve away without pain during an emergency   Increase activity slowly   Complete by: As directed    Lifting restrictions   Complete by: As directed    Avoid heavy lifting initially, <20 pounds at first.   Do not push through pain.   You have no specific weight limit: If it hurts to do, DON'T DO IT.    If you feel no pain, you are not injuring anything.  Pain will protect you from injury.   Coughing and sneezing are far more stressful to your incision than any lifting.   Avoid resuming heavy lifting (>50 pounds) or other intense activity until off all narcotic pain medications.   When want to exercise more, give yourself 2 weeks to gradually get back to full intense exercise/activity.   May shower / Bathe   Complete by: As directed    SHOWER EVERY DAY.  It is fine for dressings or wounds to be  washed/rinsed.  Use gentle soap & water .  This will help the incisions and/or wounds get clean & minimize infection.   May walk up steps   Complete by: As directed    Remove dressing in 48 hours   Complete by: As directed    Sexual Activity Restrictions   Complete by: As directed    Sexual activity as tolerated.  Do not push through pain.  Pain will protect you from injury.   Walk with assistance   Complete by: As directed    Walk over an hour a day.  May use a walker/cane/companion to help with balance and stamina.       Allergies as of 08/02/2024   No Known Allergies      Medication List     TAKE these medications    albuterol  108 (90 Base) MCG/ACT inhaler Commonly known as: VENTOLIN  HFA Inhale 1-2 puffs into the lungs every 4 (four) hours as needed for shortness of breath.   ALPRAZolam  0.25 MG tablet Commonly known as: XANAX  Take  1 tablet (0.25 mg total) by mouth 2 (two) times daily as needed for anxiety.   amLODipine -valsartan  5-160 MG tablet Commonly known as: EXFORGE  Take 1 tablet by mouth daily.   aspirin  EC 81 MG tablet Take 81 mg by mouth daily.   atorvastatin  20 MG tablet Commonly known as: LIPITOR Take 20 mg by mouth every evening.   azelastine  0.1 % nasal spray Commonly known as: ASTELIN  Place 1 spray into both nostrils 2 (two) times daily as needed for rhinitis. Use in each nostril as directed   benzonatate  200 MG capsule Commonly known as: TESSALON  Take 200 mg by mouth 3 (three) times daily as needed for cough.   EPINEPHrine  0.3 mg/0.3 mL Soaj injection Commonly known as: EPI-PEN Inject 0.3 mg into the muscle as needed for anaphylaxis.   famotidine  20 MG tablet Commonly known as: PEPCID  Take 20 mg by mouth 2 (two) times daily.   finasteride  5 MG tablet Commonly known as: PROSCAR  Take 5 mg by mouth daily.   fluticasone  50 MCG/ACT nasal spray Commonly known as: FLONASE  Place 2 sprays into both nostrils daily as needed for allergies.    glucose blood test strip 1 each by Other route as needed for other. Use as instructed   levocetirizine 5 MG tablet Commonly known as: XYZAL  Take 5 mg by mouth every evening.   meloxicam 15 MG tablet Commonly known as: MOBIC Take 15 mg by mouth daily.   metFORMIN  500 MG 24 hr tablet Commonly known as: GLUCOPHAGE -XR Take 2 tablets (1,000 mg total) by mouth 2 (two) times daily.   mirabegron  ER 50 MG Tb24 tablet Commonly known as: MYRBETRIQ  Take 50 mg by mouth daily.   montelukast  10 MG tablet Commonly known as: SINGULAIR  Take 10 mg by mouth at bedtime.   multivitamin with minerals Tabs tablet Take 1 tablet by mouth daily.   ondansetron  4 MG tablet Commonly known as: ZOFRAN  Take 1 tablet (4 mg total) by mouth every 8 (eight) hours as needed for nausea or vomiting.   Ozempic  (2 MG/DOSE) 8 MG/3ML Sopn Generic drug: Semaglutide  (2 MG/DOSE) Inject 2 mg into the skin once a week.   pantoprazole  40 MG tablet Commonly known as: PROTONIX  Take 40 mg by mouth 2 (two) times daily.   tadalafil  5 MG tablet Commonly known as: CIALIS  Take 5 mg by mouth daily.   traMADol  50 MG tablet Commonly known as: ULTRAM  Take 1-2 tablets (50-100 mg total) by mouth every 6 (six) hours as needed for moderate pain (pain score 4-6) or severe pain (pain score 7-10).   Vitamin D  (Ergocalciferol ) 1.25 MG (50000 UNIT) Caps capsule Commonly known as: DRISDOL  TAKE 1 CAPSULE BY MOUTH EVERY 14 DAYS   zolpidem  10 MG tablet Commonly known as: AMBIEN  Take 10 mg by mouth at bedtime.        Significant Diagnostic Studies:  Results for orders placed or performed during the hospital encounter of 08/01/24 (from the past 72 hours)  Glucose, capillary     Status: Abnormal   Collection Time: 08/01/24  9:35 AM  Result Value Ref Range   Glucose-Capillary 147 (H) 70 - 99 mg/dL    Comment: Glucose reference range applies only to samples taken after fasting for at least 8 hours.   Comment 1 Notify RN    Glucose, capillary     Status: Abnormal   Collection Time: 08/01/24 12:38 PM  Result Value Ref Range   Glucose-Capillary 162 (H) 70 - 99 mg/dL    Comment: Glucose  reference range applies only to samples taken after fasting for at least 8 hours.  Glucose, capillary     Status: Abnormal   Collection Time: 08/01/24  1:27 PM  Result Value Ref Range   Glucose-Capillary 136 (H) 70 - 99 mg/dL    Comment: Glucose reference range applies only to samples taken after fasting for at least 8 hours.  Hemoglobin A1c     Status: Abnormal   Collection Time: 08/01/24  3:56 PM  Result Value Ref Range   Hgb A1c MFr Bld 6.1 (H) 4.8 - 5.6 %    Comment: (NOTE) Diagnosis of Diabetes The following HbA1c ranges recommended by the American Diabetes Association (ADA) may be used as an aid in the diagnosis of diabetes mellitus.  Hemoglobin             Suggested A1C NGSP%              Diagnosis  <5.7                   Non Diabetic  5.7-6.4                Pre-Diabetic  >6.4                   Diabetic  <7.0                   Glycemic control for                       adults with diabetes.     Mean Plasma Glucose 128.37 mg/dL    Comment: Performed at [facility], [address], [place], [place] [address]  Glucose, capillary     Status: Abnormal   Collection Time: 08/01/24  4:32 PM  Result Value Ref Range   Glucose-Capillary 148 (H) 70 - 99 mg/dL    Comment: Glucose reference range applies only to samples taken after fasting for at least 8 hours.  Glucose, capillary     Status: Abnormal   Collection Time: 08/01/24  9:11 PM  Result Value Ref Range   Glucose-Capillary 200 (H) 70 - 99 mg/dL    Comment: Glucose reference range applies only to samples taken after fasting for at least 8 hours.    No results found.  Past Medical History:  Diagnosis Date   Allergies    Anxiety    Arthritis    Coronary artery disease    CPAP (continuous positive airway pressure) dependence    Edema of both  lower extremities    Empyema (HCC)    GERD (gastroesophageal reflux disease)    Hepatitis 1980'S   HEPATITIS B   High cholesterol    Hypertension    Joint pain    Pneumonia    Pre-diabetes    Sleep apnea    CPAP   SOB (shortness of breath)     Past Surgical History:  Procedure Laterality Date   APPENDECTOMY  AGE 65 OR 13   BIOPSY  07/31/2023   Procedure: BIOPSY;  Surgeon: Wilhelmenia Aloha Raddle., MD;  Location: [facility];  Service: Gastroenterology;;   COLONOSCOPY N/A 05/02/2024   Procedure: COLONOSCOPY;  Surgeon: Wilhelmenia Aloha Raddle., MD;  Location: [facility];  Service: Gastroenterology;  Laterality: N/A;   COLONOSCOPY WITH PROPOFOL  N/A 08/13/2013   Procedure: COLONOSCOPY WITH PROPOFOL ;  Surgeon: Gladis MARLA Louder, MD;  Location: [facility];  Service: Endoscopy;  Laterality: N/A;   DECORTICATION Right 04/03/2019  Procedure: DECORTICATION OF RIGHT LUNG;  Surgeon: Lucas Dorise POUR, MD;  Location[facility];  Service: Thoracic;  Laterality: Right;   EMPYEMA DRAINAGE Right 04/03/2019   Procedure: EMPYEMA DRAINAGE;  Surgeon: Lucas Dorise POUR, MD;  Location: [facility] OR;  Service: Thoracic;  Laterality: Right;   ESOPHAGOGASTRODUODENOSCOPY N/A 07/31/2023   Procedure: ESOPHAGOGASTRODUODENOSCOPY (EGD);  Surgeon: Wilhelmenia Aloha Raddle., MD;  Location: [facility];  Service: Gastroenterology;  Laterality: N/A;   ESOPHAGOGASTRODUODENOSCOPY N/A 05/02/2024   Procedure: EGD (ESOPHAGOGASTRODUODENOSCOPY);  Surgeon: Wilhelmenia Aloha Raddle., MD;  Location: [facility];  Service: Gastroenterology;  Laterality: N/A;   ESOPHAGOGASTRODUODENOSCOPY N/A 08/01/2024   Procedure: EGD (ESOPHAGOGASTRODUODENOSCOPY);  Surgeon: Sheldon Standing, MD;  Location: [facility];  Service: General;  Laterality: N/A;   EUS N/A 07/31/2023   Procedure: UPPER ENDOSCOPIC ULTRASOUND (EUS) RADIAL;  Surgeon: Wilhelmenia Aloha Raddle., MD;  Location: [facility];  Service: Gastroenterology;  Laterality: N/A;   EUS N/A 05/02/2024    Procedure: ULTRASOUND, UPPER GI TRACT, ENDOSCOPIC;  Surgeon: Wilhelmenia Aloha Raddle., MD;  Location: [facility];  Service: Gastroenterology;  Laterality: N/A;   EYE SURGERY Left    retinal detachment and cataract surgery   FINE NEEDLE ASPIRATION N/A 07/31/2023   Procedure: FINE NEEDLE ASPIRATION (FNA) LINEAR;  Surgeon: Wilhelmenia Aloha Raddle., MD;  Location: [facility];  Service: Gastroenterology;  Laterality: N/A;   FINE NEEDLE ASPIRATION BIOPSY  05/02/2024   Procedure: FINE NEEDLE ASPIRATION BIOPSY;  Surgeon: Wilhelmenia Aloha Raddle., MD;  Location: [facility];  Service: Gastroenterology;;   GASTRECTOMY, PARTIAL, ROBOT-ASSISTED N/A 08/01/2024   Procedure: GASTRECTOMY, PARTIAL, ROBOT-ASSISTED;  Surgeon: Sheldon Standing, MD;  Location: [facility];  Service: General;  Laterality: N/A;   INGUINAL HERNIA REPAIR Left    when he was an infant   IR THORACENTESIS ASP PLEURAL SPACE W/IMG GUIDE  03/07/2019   LEFT HEART CATH AND CORONARY ANGIOGRAPHY N/A 04/06/2021   Procedure: LEFT HEART CATH AND CORONARY ANGIOGRAPHY;  Surgeon: [place]azil[place], Peter M, MD;  Location: [facility] INV[facility] CV LAB;  Service: Cardiovascular;  Laterality: N/A;   PAIN PUMP IMPLANTATION  04/03/2019   Procedure: PLACEMENT OF ON-Q PAIN PUMP;  Surgeon: Lucas Dorise POUR, MD;  Location: [facility] OR;  Service: Thoracic;;   SUBMUCOSAL INJECTION  05/02/2024   Procedure: INJECTION, SUBMUCOSAL;  Surgeon: Wilhelmenia Aloha Raddle., MD;  Location: [facility];  Service: Gastroenterology;;   THORACOTOMY Right 04/03/2019   Procedure: THORACOTOMY MAJOR;  Surgeon: Lucas Dorise POUR, MD;  Location[facility] OR;  Service: Thoracic;  Laterality: Right;   UMBILICAL HERNIA REPAIR N/A 08/01/2024   Procedure: REPAIR, HERNIA, UMBILICAL, ADULT;  Surgeon: Sheldon Standing, MD;  Location: [facility];  Service: General;  Laterality: N/A;    Social History   Socioeconomic History   Marital status: Single    Spouse name: Not on file   Number of children: 0   Years of education: Not on file    Highest education level: Not on file  Occupational History   Occupation: Retired Audiological scientist Professor  Tobacco Use   Smoking status: Former    Current packs/day: 1.50    Average packs/day: 1.5 packs/day for 15.0 years (22.5 ttl pk-yrs)    Types: Cigarettes    Passive exposure: Past   Smokeless tobacco: Never  Vaping Use   Vaping status: Never Used  Substance and Sexual Activity   Alcohol use: Yes    Comment: rare   Drug use: No    Comment: QUIT SMOKING 25 YRS AGO   Sexual activity: Not Currently  Other Topics Concern  Not on file  Social History Narrative   Not on file   Social Drivers of Health   Financial Resource Strain: Not on file  Food Insecurity: No Food Insecurity (08/01/2024)   Hunger Vital Sign    Worried About Running Out of Food in the Last Year: Never true    Ran Out of Food in the Last Year: Never true  Transportation Needs: No Transportation Needs (08/01/2024)   PRAPARE - Administrator, Civil Service (Medical): No    Lack of Transportation (Non-Medical): No  Physical Activity: Not on file  Stress: Not on file  Social Connections: Patient Declined (08/01/2024)   Social Connection and Isolation Panel    Frequency of Communication with Friends and Family: Patient declined    Frequency of Social Gatherings with Friends and Family: Patient declined    Attends Religious Services: Patient declined    Database administrator or Organizations: Patient declined    Attends Banker Meetings: Patient declined    Marital Status: Patient declined  Intimate Partner Violence: Not At Risk (08/01/2024)   Humiliation, Afraid, Rape, and Kick questionnaire    Fear of Current or Ex-Partner: No    Emotionally Abused: No    Physically Abused: No    Sexually Abused: No    Family History  Problem Relation Age of Onset   High blood pressure Mother    High Cholesterol Mother    Heart disease Mother    Anxiety disorder Mother    Obesity Mother     Diabetes Father    High Cholesterol Father    Cancer Father    Seizures Brother    Colon cancer Neg Hx    Esophageal cancer Neg Hx    Stomach cancer Neg Hx     Current Facility-Administered Medications  Medication Dose Route Frequency Provider Last Rate Last Admin   0.9 %  sodium chloride  infusion  250 mL Intravenous PRN Sheldon Standing, MD       acetaminophen  (TYLENOL ) tablet 1,000 mg  1,000 mg Oral Q6H Cadan Maggart, MD   1,000 mg at 08/02/24 9478   albuterol  (PROVENTIL ) (2.5 MG/3ML) 0.083% nebulizer solution 2.5 mg  2.5 mg Nebulization Q4H PRN Sheldon Standing, MD       ALPRAZolam  (XANAX ) tablet 0.25 mg  0.25 mg Oral BID PRN Sheldon Standing, MD       amLODipine  (NORVASC ) tablet 5 mg  5 mg Oral Daily Sheldon Standing, MD       And   irbesartan  (AVAPRO ) tablet 150 mg  150 mg Oral Daily Sheldon Standing, MD       aspirin  EC tablet 81 mg  81 mg Oral Daily Sheldon Standing, MD       atorvastatin  (LIPITOR) tablet 20 mg  20 mg Oral QPM Sheldon Standing, MD   20 mg at 08/01/24 1648   azelastine  (ASTELIN ) 0.1 % nasal spray 1 spray  1 spray Each Nare BID PRN Sheldon Standing, MD       benzonatate  (TESSALON ) capsule 200 mg  200 mg Oral TID PRN Sheldon Standing, MD   200 mg at 08/01/24 2126   bisacodyl  (DULCOLAX) suppository 10 mg  10 mg Rectal Daily PRN Sheldon Standing, MD       diphenhydrAMINE  (BENADRYL ) 12.5 MG/5ML elixir 12.5 mg  12.5 mg Oral Q6H PRN Sheldon Standing, MD       Or   diphenhydrAMINE  (BENADRYL ) injection 12.5 mg  12.5 mg Intravenous Q6H PRN Sheldon Standing, MD  enoxaparin  (LOVENOX ) injection 40 mg  40 mg Subcutaneous Q24H Sheldon Standing, MD       famotidine  (PEPCID ) tablet 20 mg  20 mg Oral BID Sheldon Standing, MD   20 mg at 08/01/24 2127   finasteride  (PROSCAR ) tablet 5 mg  5 mg Oral Daily Sheldon Standing, MD   5 mg at 08/01/24 1540   fluticasone  (FLONASE ) 50 MCG/ACT nasal spray 2 spray  2 spray Each Nare Daily PRN Sheldon Standing, MD       gabapentin  (NEURONTIN ) capsule 300 mg  300 mg Oral BID Sheldon Standing,  MD   300 mg at 08/01/24 2126   HYDROmorphone  (DILAUDID ) injection 0.5-2 mg  0.5-2 mg Intravenous Q4H PRN Sheldon Standing, MD       insulin  aspart (novoLOG ) injection 0-15 Units  0-15 Units Subcutaneous TID WC Sheldon Standing, MD       insulin  aspart (novoLOG ) injection 0-5 Units  0-5 Units Subcutaneous QHS Shaylie Eklund, MD       lactated ringers  infusion   Intravenous Q8H PRN Sheldon Standing, MD       loratadine  (CLARITIN ) tablet 10 mg  10 mg Oral QPM Sheldon Standing, MD   10 mg at 08/01/24 1648   magic mouthwash  15 mL Oral QID PRN Sheldon Standing, MD       magnesium  hydroxide (MILK OF MAGNESIA) suspension 30 mL  30 mL Oral Daily PRN Sheldon Standing, MD       menthol  (CEPACOL) lozenge 3 mg  1 lozenge Oral PRN Sheldon Standing, MD       metFORMIN  (GLUCOPHAGE -XR) 24 hr tablet 1,000 mg  1,000 mg Oral BID WC Sheldon Standing, MD   1,000 mg at 08/01/24 1540   methocarbamol  (ROBAXIN ) injection 1,000 mg  1,000 mg Intravenous Q6H PRN Sheldon Standing, MD       methocarbamol  (ROBAXIN ) tablet 500 mg  500 mg Oral Q6H PRN Sheldon Standing, MD   500 mg at 08/01/24 2127   metoprolol  tartrate (LOPRESSOR ) injection 5 mg  5 mg Intravenous Q6H PRN Sheldon Standing, MD       mirabegron  ER (MYRBETRIQ ) tablet 50 mg  50 mg Oral Daily Sheldon Standing, MD       montelukast  (SINGULAIR ) tablet 10 mg  10 mg Oral QHS Slayter Moorhouse, MD   10 mg at 08/01/24 2127   multivitamin with minerals tablet 1 tablet  1 tablet Oral Daily Sheldon Standing, MD       naphazoline-glycerin  (CLEAR EYES REDNESS) ophth solution 1-2 drop  1-2 drop Both Eyes QID PRN Sheldon Standing, MD       ondansetron  (ZOFRAN -ODT) disintegrating tablet 4 mg  4 mg Oral Q6H PRN Sheldon Standing, MD       Or   ondansetron  (ZOFRAN ) injection 4 mg  4 mg Intravenous Q6H PRN Sheldon Standing, MD       pantoprazole  (PROTONIX ) EC tablet 40 mg  40 mg Oral BID Sheldon Standing, MD   40 mg at 08/01/24 2126   phenol (CHLORASEPTIC) mouth spray 2 spray  2 spray Mouth/Throat PRN Sheldon Standing, MD       polycarbophil  (FIBERCON) tablet 625 mg  625 mg Oral BID Sheldon Standing, MD   625 mg at 08/01/24 2127   prochlorperazine  (COMPAZINE ) tablet 10 mg  10 mg Oral Q6H PRN Sheldon Standing, MD       Or   prochlorperazine  (COMPAZINE ) injection 5-10 mg  5-10 mg Intravenous Q6H PRN Sheldon Standing, MD       simethicone  [facility]) chewable  tablet 40 mg  40 mg Oral Q6H PRN Sheldon Standing, MD   40 mg at 08/02/24 0522   sodium chloride  (OCEAN) 0.65 % nasal spray 1-2 spray  1-2 spray Each Nare Q6H PRN Sheldon Standing, MD       sodium chloride  flush (NS) 0.9 % injection 3 mL  3 mL Intravenous Q12H Takhia Spoon, MD       sodium chloride  flush (NS) 0.9 % injection 3 mL  3 mL Intravenous PRN Sheldon Standing, MD       traMADol  (ULTRAM ) tablet 50-100 mg  50-100 mg Oral Q6H PRN Sheldon Standing, MD   100 mg at 08/02/24 0522   zolpidem  (AMBIEN ) tablet 10 mg  10 mg Oral QHS Renly Roots, MD   10 mg at 08/01/24 2127     No Known Allergies  Signed:   Standing KYM Sheldon, MD, FACS, MASCRS Esophageal, Gastrointestinal & Colorectal Surgery Robotic and Minimally Invasive Surgery  [facility] [address], [address] [place], [place] [address] [phone] Fax [phone] Main  CONTACT INFORMATION: Weekday (9AM-5PM): Call [facility] main office at [phone] Weeknight (5PM-9AM) or Weekend/Holiday: Check EPIC Web Links tab & use AMION (password  [email or web address]) for General Surgery [facility] coverage  Please, DO NOT use SecureChat  (it is not reliable communication to reach operating surgeons & will lead to a delay in care).   Epic staff messaging available for outptient concerns needing 1-2 business day response.      08/02/2024, 7:23 AM

## 2024-08-02 NOTE — Progress Notes (Signed)
   08/02/24 1124  TOC Brief Assessment  Insurance and Status Reviewed  Patient has primary care physician Yes  Home environment has been reviewed resides in private residence  Prior level of function: Independent  Prior/Current Home Services No current home services  Social Drivers of Health Review SDOH reviewed no interventions necessary  Readmission risk has been reviewed Yes  Transition of care needs no transition of care needs at this time

## 2024-08-07 DIAGNOSIS — J3089 Other allergic rhinitis: Secondary | ICD-10-CM | POA: Diagnosis not present

## 2024-08-07 DIAGNOSIS — J301 Allergic rhinitis due to pollen: Secondary | ICD-10-CM | POA: Diagnosis not present

## 2024-08-07 DIAGNOSIS — J3081 Allergic rhinitis due to animal (cat) (dog) hair and dander: Secondary | ICD-10-CM | POA: Diagnosis not present

## 2024-08-08 ENCOUNTER — Ambulatory Visit: Payer: Self-pay | Admitting: Surgery

## 2024-08-08 LAB — SURGICAL PATHOLOGY

## 2024-08-09 DIAGNOSIS — K3189 Other diseases of stomach and duodenum: Secondary | ICD-10-CM | POA: Diagnosis not present

## 2024-08-09 DIAGNOSIS — E1169 Type 2 diabetes mellitus with other specified complication: Secondary | ICD-10-CM | POA: Diagnosis not present

## 2024-08-09 DIAGNOSIS — F5104 Psychophysiologic insomnia: Secondary | ICD-10-CM | POA: Diagnosis not present

## 2024-08-09 DIAGNOSIS — F410 Panic disorder [episodic paroxysmal anxiety] without agoraphobia: Secondary | ICD-10-CM | POA: Diagnosis not present

## 2024-08-12 DIAGNOSIS — J3081 Allergic rhinitis due to animal (cat) (dog) hair and dander: Secondary | ICD-10-CM | POA: Diagnosis not present

## 2024-08-12 DIAGNOSIS — J301 Allergic rhinitis due to pollen: Secondary | ICD-10-CM | POA: Diagnosis not present

## 2024-08-12 DIAGNOSIS — J3089 Other allergic rhinitis: Secondary | ICD-10-CM | POA: Diagnosis not present

## 2024-08-19 DIAGNOSIS — J301 Allergic rhinitis due to pollen: Secondary | ICD-10-CM | POA: Diagnosis not present

## 2024-08-19 DIAGNOSIS — J3081 Allergic rhinitis due to animal (cat) (dog) hair and dander: Secondary | ICD-10-CM | POA: Diagnosis not present

## 2024-08-19 DIAGNOSIS — J3089 Other allergic rhinitis: Secondary | ICD-10-CM | POA: Diagnosis not present

## 2024-08-26 DIAGNOSIS — J301 Allergic rhinitis due to pollen: Secondary | ICD-10-CM | POA: Diagnosis not present

## 2024-08-26 DIAGNOSIS — J3081 Allergic rhinitis due to animal (cat) (dog) hair and dander: Secondary | ICD-10-CM | POA: Diagnosis not present

## 2024-08-26 DIAGNOSIS — J3089 Other allergic rhinitis: Secondary | ICD-10-CM | POA: Diagnosis not present

## 2024-08-27 ENCOUNTER — Encounter: Payer: Self-pay | Admitting: Physician Assistant

## 2024-08-27 ENCOUNTER — Ambulatory Visit (INDEPENDENT_AMBULATORY_CARE_PROVIDER_SITE_OTHER): Admitting: Physician Assistant

## 2024-08-27 VITALS — BP 141/79 | HR 100 | Ht 71.0 in | Wt 239.0 lb

## 2024-08-27 DIAGNOSIS — G4733 Obstructive sleep apnea (adult) (pediatric): Secondary | ICD-10-CM

## 2024-08-27 DIAGNOSIS — F410 Panic disorder [episodic paroxysmal anxiety] without agoraphobia: Secondary | ICD-10-CM | POA: Diagnosis not present

## 2024-08-27 DIAGNOSIS — G4700 Insomnia, unspecified: Secondary | ICD-10-CM

## 2024-08-27 MED ORDER — ALPRAZOLAM 0.25 MG PO TABS: 0.2500 mg | ORAL_TABLET | Freq: Two times a day (BID) | ORAL | 2 refills | Status: AC | PRN

## 2024-08-27 MED ORDER — PROPRANOLOL HCL 10 MG PO TABS: 10.0000 mg | ORAL_TABLET | Freq: Two times a day (BID) | ORAL | 0 refills | Status: DC | PRN

## 2024-08-27 MED ORDER — BUSPIRONE HCL 10 MG PO TABS: 10.0000 mg | ORAL_TABLET | Freq: Two times a day (BID) | ORAL | 1 refills | Status: AC

## 2024-08-27 NOTE — Progress Notes (Unsigned)
 [facility]/PA/NP Initial Note  08/27/2024 12:43 PM Marc Gates  MRN:  [id number]  Chief Complaint:  Chief Complaint   Establish Care    HPI:   To reestablish care.  Marc Gates is a former patient of mine, last seen 02/26/2020.  He has anxiety that comes and goes throughout his life.  He was doing very well, he stopped the BuSpar  and Xanax  after that visit because he felt that he no longer needed them.  He went for 3.5 years or so without any problems.  Then a few months ago he started having panic attacks.  They come out of nowhere.  When they happen he gets hot all over like a hot flash, he has tingling in his hands, mostly on the right and the length of time he feels that way varies.  He has taken old Xanax  that he had on hand and it was effective.  He saw his PCP within the past month or so who started him back on BuSpar .  He has been on it for probably 2 weeks and on this dose for about 6 days.  His primary provider mentioned he should not be on a benzo at his age.  He has had no side effects from it, no reports of dizziness, confusion, or falls.  Up until the past month or so he had not taken Xanax  in several years.  He has not had a panic attack since being back on the BuSpar .  He also reports the anxiety seems to be worse in the evening and at night.  The only trigger he can think of is that he has had major things going on with 7 close friends, including an amputation, dementia, end-of-life, he is getting calls almost weekly to tell him of these bad news things going on.  Patient is able to enjoy things.  He still likes to travel to [place] and goes as often as he can.  He also goes to his home in [place]  when desired.  Energy and motivation are good.  He walks long distances daily.  Appetite is good and he eats pretty healthy.  No extreme sadness, tearfulness, or feelings of hopelessness.  Sleeps well but he has to take Cialis , Myrbetriq , Ambien , and uses his CPAP or else he does not get a good  night sleep.  He feels rested when he gets up in the morning.  ADLs and personal hygiene are normal.   Denies any changes in concentration, making decisions, or remembering things.  No SI/HI.  No reports of increased energy with decreased need for sleep, increased talkativeness, racing thoughts, impulsivity or risky behaviors, increased spending, increased libido, grandiosity, increased irritability or anger, paranoia, or hallucinations.  Visit Diagnosis:    ICD-10-CM   1. Panic attacks  F41.0     2. Insomnia, unspecified type  G47.00     3. Obstructive sleep apnea  G47.33      Past Psychiatric History:   Past medications for mental health diagnoses include: Zoloft ? Unsure of the antidpressant.  He only took 1 pill because he slept the whole next day.  BuSpar , Xanax   Past Medical History:  Past Medical History:  Diagnosis Date   Allergies    Anxiety    Arthritis    CPAP (continuous positive airway pressure) dependence    Diabetes mellitus, type II (HCC)    Edema of both lower extremities    Empyema (HCC)    GERD (gastroesophageal reflux disease)    Hepatitis 1980'S  HEPATITIS B   High cholesterol    Hypertension    Joint pain    Pneumonia    Pre-diabetes    Sleep apnea    CPAP   SOB (shortness of breath)     Past Surgical History:  Procedure Laterality Date   APPENDECTOMY  AGE 59 OR 13   BIOPSY  07/31/2023   Procedure: BIOPSY;  Surgeon: Marc Gates., Gates;  Location: [facility];  Service: Gastroenterology;;   COLONOSCOPY N/A 05/02/2024   Procedure: COLONOSCOPY;  Surgeon: Marc Gates., Gates;  Location: [facility];  Service: Gastroenterology;  Laterality: N/A;   COLONOSCOPY WITH PROPOFOL  N/A 08/13/2013   Procedure: COLONOSCOPY WITH PROPOFOL ;  Surgeon: Marc Gates Louder, Gates;  Location: [facility];  Service: Endoscopy;  Laterality: N/A;   DECORTICATION Right 04/03/2019   Procedure: DECORTICATION OF RIGHT LUNG;  Surgeon: Marc Dorise MARLA, Gates;   Location: [facility];  Service: Thoracic;  Laterality: Right;   EMPYEMA DRAINAGE Right 04/03/2019   Procedure: EMPYEMA DRAINAGE;  Surgeon: Marc Dorise MARLA, Gates;  Location: [facility];  Service: Thoracic;  Laterality: Right;   ESOPHAGOGASTRODUODENOSCOPY N/A 07/31/2023   Procedure: ESOPHAGOGASTRODUODENOSCOPY (EGD);  Surgeon: Marc Gates., Gates;  Location: [facility];  Service: Gastroenterology;  Laterality: N/A;   ESOPHAGOGASTRODUODENOSCOPY N/A 05/02/2024   Procedure: EGD (ESOPHAGOGASTRODUODENOSCOPY);  Surgeon: Marc Gates., Gates;  Location: [facility];  Service: Gastroenterology;  Laterality: N/A;   ESOPHAGOGASTRODUODENOSCOPY N/A 08/01/2024   Procedure: EGD (ESOPHAGOGASTRODUODENOSCOPY);  Surgeon: Marc Gates;  Location: [facility];  Service: General;  Laterality: N/A;   EUS N/A 07/31/2023   Procedure: UPPER ENDOSCOPIC ULTRASOUND (EUS) RADIAL;  Surgeon: Marc Gates., Gates;  Location: [facility];  Service: Gastroenterology;  Laterality: N/A;   EUS N/A 05/02/2024   Procedure: ULTRASOUND, UPPER GI TRACT, ENDOSCOPIC;  Surgeon: Marc Gates., Gates;  Location: [facility];  Service: Gastroenterology;  Laterality: N/A;   EYE SURGERY Left    retinal detachment and cataract surgery   FINE NEEDLE ASPIRATION N/A 07/31/2023   Procedure: FINE NEEDLE ASPIRATION (FNA) LINEAR;  Surgeon: Marc Gates., Gates;  Location: [facility];  Service: Gastroenterology;  Laterality: N/A;   FINE NEEDLE ASPIRATION BIOPSY  05/02/2024   Procedure: FINE NEEDLE ASPIRATION BIOPSY;  Surgeon: Marc Gates., Gates;  Location: [facility];  Service: Gastroenterology;;   GASTRECTOMY, PARTIAL, ROBOT-ASSISTED N/A 08/01/2024   Procedure: GASTRECTOMY, PARTIAL, ROBOT-ASSISTED;  Surgeon: Marc Gates;  Location: [facility];  Service: General;  Laterality: N/A;   INGUINAL HERNIA REPAIR Left    when he was an infant   IR THORACENTESIS ASP PLEURAL SPACE W/IMG GUIDE  03/07/2019   LEFT HEART CATH  AND CORONARY ANGIOGRAPHY N/A 04/06/2021   Procedure: LEFT HEART CATH AND CORONARY ANGIOGRAPHY;  Surgeon: Marc Gates, Marc Gates, Gates;  Location: [facility];  Service: Cardiovascular;  Laterality: N/A;   PAIN PUMP IMPLANTATION  04/03/2019   Procedure: PLACEMENT OF ON-Q PAIN PUMP;  Surgeon: Marc Dorise MARLA, Gates;  Location: [facility];  Service: Thoracic;;   SUBMUCOSAL INJECTION  05/02/2024   Procedure: INJECTION, SUBMUCOSAL;  Surgeon: Marc Gates., Gates;  Location: [facility];  Service: Gastroenterology;;   THORACOTOMY Right 04/03/2019   Procedure: THORACOTOMY MAJOR;  Surgeon: Marc Dorise MARLA, Gates;  Location: [facility];  Service: Thoracic;  Laterality: Right;   UMBILICAL HERNIA REPAIR N/A 08/01/2024   Procedure: REPAIR, HERNIA, UMBILICAL, ADULT;  Surgeon: Marc Gates;  Location: [facility];  Service: General;  Laterality: N/A;    Family Psychiatric History:  See below  Family History:  Family History  Problem Relation Age of Onset   High blood pressure Mother    High Cholesterol Mother    Heart disease Mother    Anxiety disorder Mother    Obesity Mother    Diabetes Father    High Cholesterol Father    Cancer Father    Seizures Brother    Colon cancer Neg Hx    Esophageal cancer Neg Hx    Stomach cancer Neg Hx     Social History:  Social History   Socioeconomic History   Marital status: Single    Spouse name: Not on file   Number of children: 0   Years of education: Not on file   Highest education level: Not on file  Occupational History   Occupation: Retired Audiological scientist Professor  Tobacco Use   Smoking status: Former    Current packs/day: 1.50    Average packs/day: 1.5 packs/day for 15.0 years (22.5 ttl pk-yrs)    Types: Cigarettes    Passive exposure: Past   Smokeless tobacco: Never  Vaping Use   Vaping status: Never Used  Substance and Sexual Activity   Alcohol use: Yes    Comment: rare   Drug use: No    Comment: QUIT SMOKING 25 YRS AGO   Sexual activity: Not  Currently  Other Topics Concern   Not on file  Social History Narrative   Not on file   Social Drivers of Health   Financial Resource Strain: Low Risk  (08/28/2024)   Overall Financial Resource Strain (CARDIA)    Difficulty of Paying Living Expenses: Not hard at all  Food Insecurity: No Food Insecurity (08/28/2024)   Hunger Vital Sign    Worried About Running Out of Food in the Last Year: Never true    Ran Out of Food in the Last Year: Never true  Transportation Needs: No Transportation Needs (08/28/2024)   PRAPARE - Administrator, Civil Service (Medical): No    Lack of Transportation (Non-Medical): No  Physical Activity: Sufficiently Active (08/28/2024)   Exercise Vital Sign    Days of Exercise per Week: 7 days    Minutes of Exercise per Session: 60 min  Stress: No Stress Concern Present (08/28/2024)   [employer] of Occupational Health - Occupational Stress Questionnaire    Feeling of Stress: Not at all  Social Connections: Unknown (08/28/2024)   Social Connection and Isolation Panel    Frequency of Communication with Friends and Family: More than three times a week    Frequency of Social Gatherings with Friends and Family: More than three times a week    Attends Religious Services: Not on file    Active Member of Clubs or Organizations: No    Attends Banker Meetings: Never    Marital Status: Not on file    Allergies: No Known Allergies  Metabolic Disorder Labs: Lab Results  Component Value Date   HGBA1C 6.1 (H) 08/01/2024   MPG 128.37 08/01/2024   MPG 168.55 04/03/2019   No results found for: PROLACTIN Lab Results  Component Value Date   CHOL 133 09/09/2021   TRIG 178 (H) 09/09/2021   HDL 45 09/09/2021   LDLCALC 58 09/09/2021   LDLCALC 30 05/27/2020   Lab Results  Component Value Date   TSH 1.260 05/27/2020    Therapeutic Level Labs: No results found for: LITHIUM No results found for: VALPROATE No results found  for: CBMZ  Current Medications: Current  Outpatient Medications  Medication Sig Dispense Refill   albuterol  (VENTOLIN  HFA) 108 (90 Base) MCG/ACT inhaler Inhale 1-2 puffs into the lungs every 4 (four) hours as needed for shortness of breath.     amLODipine -valsartan  (EXFORGE ) 5-160 MG tablet Take 1 tablet by mouth daily.     aspirin  EC 81 MG tablet Take 81 mg by mouth daily.     atorvastatin  (LIPITOR) 20 MG tablet Take 20 mg by mouth every evening.     azelastine  (ASTELIN ) 0.1 % nasal spray Place 1 spray into both nostrils 2 (two) times daily as needed for rhinitis. Use in each nostril as directed     benzonatate  (TESSALON ) 200 MG capsule Take 200 mg by mouth 3 (three) times daily as needed for cough.     EPINEPHrine  0.3 mg/0.3 mL IJ SOAJ injection Inject 0.3 mg into the muscle as needed for anaphylaxis.     famotidine  (PEPCID ) 20 MG tablet Take 20 mg by mouth 2 (two) times daily.     finasteride  (PROSCAR ) 5 MG tablet Take 5 mg by mouth daily.     fluticasone  (FLONASE ) 50 MCG/ACT nasal spray Place 2 sprays into both nostrils daily as needed for allergies.     glucose blood test strip 1 each by Other route as needed for other. Use as instructed     levocetirizine (XYZAL ) 5 MG tablet Take 5 mg by mouth every evening.     metFORMIN  (GLUCOPHAGE -XR) 500 MG 24 hr tablet Take 2 tablets (1,000 mg total) by mouth 2 (two) times daily.     mirabegron  ER (MYRBETRIQ ) 50 MG TB24 tablet Take 50 mg by mouth daily.     montelukast  (SINGULAIR ) 10 MG tablet Take 10 mg by mouth at bedtime.     Multiple Vitamin (MULTIVITAMIN WITH MINERALS) TABS tablet Take 1 tablet by mouth daily.     OZEMPIC , 2 MG/DOSE, 8 MG/3ML SOPN Inject 2 mg into the skin once a week.     pantoprazole  (PROTONIX ) 40 MG tablet Take 40 mg by mouth 2 (two) times daily.      propranolol (INDERAL) 10 MG tablet Take 1-2 tablets (10-20 mg total) by mouth 2 (two) times daily as needed. 60 tablet 0   tadalafil  (CIALIS ) 5 MG tablet Take 5 mg by mouth  daily.     Vitamin D , Ergocalciferol , (DRISDOL ) 1.25 MG (50000 UNIT) CAPS capsule TAKE 1 CAPSULE BY MOUTH EVERY 14 DAYS (Patient taking differently: 50,000 Units. TAKE 1 CAPSULE BY MOUTH EVERY 14 DAYS) 6 capsule 0   zolpidem  (AMBIEN ) 10 MG tablet Take 10 mg by mouth at bedtime.      ALPRAZolam  (XANAX ) 0.25 MG tablet Take 1 tablet (0.25 mg total) by mouth 2 (two) times daily as needed for anxiety. 30 tablet 2   busPIRone  (BUSPAR ) 10 MG tablet Take 1 tablet (10 mg total) by mouth 2 (two) times daily. 180 tablet 1   meloxicam (MOBIC) 15 MG tablet Take 15 mg by mouth daily. (Patient not taking: Reported on 08/27/2024)     ondansetron  (ZOFRAN ) 4 MG tablet Take 1 tablet (4 mg total) by mouth every 8 (eight) hours as needed for nausea or vomiting. 10 tablet 5   traMADol  (ULTRAM ) 50 MG tablet Take 1-2 tablets (50-100 mg total) by mouth every 6 (six) hours as needed for moderate pain (pain score 4-6) or severe pain (pain score 7-10). (Patient not taking: Reported on 08/27/2024) 20 tablet 0   No current facility-administered medications for this visit.   Medication Side  Effects: none  Orders placed this visit:  No orders of the defined types were placed in this encounter.   Psychiatric Specialty Exam:  Review of Systems  Constitutional: Negative.   HENT:  Positive for congestion.   Eyes: Negative.   Respiratory:  Positive for cough and shortness of breath.   Cardiovascular: Negative.   Gastrointestinal:        GERD  Endocrine: Negative.   Genitourinary: Negative.   Musculoskeletal: Negative.   Skin: Negative.   Allergic/Immunologic: Negative.   Neurological:        Tingling in his hands when he is having a panic attack.    Blood pressure (!) 141/79, pulse 100, height 5' 11 (1.803 Gates), weight 239 lb (108.4 kg).Body mass index is 33.33 kg/Gates.  General Appearance: Casual and Well Groomed  Eye Contact:  Good  Speech:  Clear and Coherent and Normal Rate  Volume:  Normal  Mood:  Anxious   Affect:  Congruent  Thought Process:  Goal Directed and Descriptions of Associations: Circumstantial  Orientation:  Full (Time, Place, and Person)  Thought Content: Logical   Suicidal Thoughts:  No  Homicidal Thoughts:  No  Memory:  WNL  Judgement:  Good  Insight:  Good  Psychomotor Activity:  Normal  Concentration:  Concentration: Good  Recall:  Good  Fund of Knowledge: Good  Language: Good  Assets:  Communication Skills Desire for Improvement Financial Resources/Insurance Housing Resilience Social Support Transportation  ADL's:  Intact  Cognition: WNL  Prognosis:  Good   Screenings:  PHQ2-9    Flowsheet Row Office Visit from 05/27/2020 in [facility] Total Score 3  PHQ-9 Total Score 13   Flowsheet Row Admission (Discharged) from 08/01/2024 in [facility] [place] [facility] Admission (Discharged) from 05/02/2024 in [facility] ENDOSCOPY Admission (Discharged) from 07/31/2023 in [facility]  C-SSRS RISK CATEGORY No Risk No Risk No Risk   Receiving Psychotherapy: No   Treatment Plan/Recommendations:   PDMP reviewed.  Xanax  given 07/30/2024.  I provided approximately 60 minutes of face to face time during this encounter, including time spent before and after the visit in records review, medical decision making, counseling pertinent to today's visit, and charting.   We discussed the benzodiazepines in people over 65, on Beers Criteria,  the risk of dizziness and increased falls is concerning.  He has not had any side effects when he took the Xanax  over the past couple of months.  He has had the same prescription for approximately 4 years so obviously not over taking them.  I am not concerned about the side effects, he knows what to watch for and definitely not drive if he needs to take it. Therefore he will continue the Xanax .  He may not need it now that he's on the Buspar , which seems to  be helping some already at this dose.  I will leave it the same for now. We also discussed Propranolol, which can be very helpful for the physical sx of panic disorder. Benefits, risks, and SE discussed and he would like to try it. Think about things he can do, like cut out caffeine or energy drinks, use breathing techniques, to help prevent anx.   Continue Xanax  0.25 mg, 1 p.o. twice daily as needed anxiety. Continue BuSpar  10 mg, 1 p.o. twice daily. Start propranolol 10 mg, 1-2 p.o. twice daily as needed anxiety. Recommend counseling. Return in 6 weeks.  Verneita Cooks,  PA-C

## 2024-09-02 DIAGNOSIS — J3081 Allergic rhinitis due to animal (cat) (dog) hair and dander: Secondary | ICD-10-CM | POA: Diagnosis not present

## 2024-09-02 DIAGNOSIS — J301 Allergic rhinitis due to pollen: Secondary | ICD-10-CM | POA: Diagnosis not present

## 2024-09-02 DIAGNOSIS — J3089 Other allergic rhinitis: Secondary | ICD-10-CM | POA: Diagnosis not present

## 2024-09-05 DIAGNOSIS — E1169 Type 2 diabetes mellitus with other specified complication: Secondary | ICD-10-CM | POA: Diagnosis not present

## 2024-09-05 DIAGNOSIS — M792 Neuralgia and neuritis, unspecified: Secondary | ICD-10-CM | POA: Diagnosis not present

## 2024-09-05 DIAGNOSIS — E1151 Type 2 diabetes mellitus with diabetic peripheral angiopathy without gangrene: Secondary | ICD-10-CM | POA: Diagnosis not present

## 2024-09-05 DIAGNOSIS — B351 Tinea unguium: Secondary | ICD-10-CM | POA: Diagnosis not present

## 2024-09-05 DIAGNOSIS — I70203 Unspecified atherosclerosis of native arteries of extremities, bilateral legs: Secondary | ICD-10-CM | POA: Diagnosis not present

## 2024-09-05 DIAGNOSIS — F419 Anxiety disorder, unspecified: Secondary | ICD-10-CM | POA: Diagnosis not present

## 2024-09-05 DIAGNOSIS — E1142 Type 2 diabetes mellitus with diabetic polyneuropathy: Secondary | ICD-10-CM | POA: Diagnosis not present

## 2024-09-05 DIAGNOSIS — E11649 Type 2 diabetes mellitus with hypoglycemia without coma: Secondary | ICD-10-CM | POA: Diagnosis not present

## 2024-09-05 DIAGNOSIS — L603 Nail dystrophy: Secondary | ICD-10-CM | POA: Diagnosis not present

## 2024-09-05 DIAGNOSIS — L851 Acquired keratosis [keratoderma] palmaris et plantaris: Secondary | ICD-10-CM | POA: Diagnosis not present

## 2024-09-06 DIAGNOSIS — J301 Allergic rhinitis due to pollen: Secondary | ICD-10-CM | POA: Diagnosis not present

## 2024-09-08 DIAGNOSIS — J189 Pneumonia, unspecified organism: Secondary | ICD-10-CM | POA: Diagnosis not present

## 2024-09-08 DIAGNOSIS — R0602 Shortness of breath: Secondary | ICD-10-CM | POA: Diagnosis not present

## 2024-09-12 ENCOUNTER — Telehealth: Payer: Self-pay | Admitting: Physician Assistant

## 2024-09-12 DIAGNOSIS — J3089 Other allergic rhinitis: Secondary | ICD-10-CM | POA: Diagnosis not present

## 2024-09-12 DIAGNOSIS — J301 Allergic rhinitis due to pollen: Secondary | ICD-10-CM | POA: Diagnosis not present

## 2024-09-12 DIAGNOSIS — J3081 Allergic rhinitis due to animal (cat) (dog) hair and dander: Secondary | ICD-10-CM | POA: Diagnosis not present

## 2024-09-12 NOTE — Telephone Encounter (Signed)
 Pt LVM @ 3:48p wanting to know how to contact Verneita through [facility].  I called him at 5:03p and he said that he has Pneumonia.  He said he take Buspar  and now that he has pneumonia, he has breathing issues which is causing him to have anxiety.  He wants to know if he can increase the current dose Buspar  from 20mg  a day to 40mg  a day while he has pneumonia.  Next appt 12/4

## 2024-09-12 NOTE — Telephone Encounter (Signed)
 Pt was diagnosed with PNA and some SOB is causing increased anxiety. He is on Buspar  10 mg BID and asks if he can increase dose. He said he was prescribed doxycycline , but no steroids or inhalers. He rarely takes Xanax , did take one last night, but it has been about 3 weeks since he has taken it previously.

## 2024-09-13 ENCOUNTER — Emergency Department (HOSPITAL_BASED_OUTPATIENT_CLINIC_OR_DEPARTMENT_OTHER)
Admission: EM | Admit: 2024-09-13 | Discharge: 2024-09-13 | Disposition: A | Discharge status: Home / Self Care | Source: Ambulatory Visit | Attending: Emergency Medicine | Admitting: Emergency Medicine

## 2024-09-13 ENCOUNTER — Emergency Department (HOSPITAL_BASED_OUTPATIENT_CLINIC_OR_DEPARTMENT_OTHER): Admitting: Radiology

## 2024-09-13 ENCOUNTER — Emergency Department (HOSPITAL_BASED_OUTPATIENT_CLINIC_OR_DEPARTMENT_OTHER)

## 2024-09-13 ENCOUNTER — Encounter (HOSPITAL_BASED_OUTPATIENT_CLINIC_OR_DEPARTMENT_OTHER): Payer: Self-pay

## 2024-09-13 ENCOUNTER — Other Ambulatory Visit: Payer: Self-pay

## 2024-09-13 DIAGNOSIS — E119 Type 2 diabetes mellitus without complications: Secondary | ICD-10-CM | POA: Diagnosis not present

## 2024-09-13 DIAGNOSIS — R918 Other nonspecific abnormal finding of lung field: Secondary | ICD-10-CM | POA: Diagnosis not present

## 2024-09-13 DIAGNOSIS — Z7982 Long term (current) use of aspirin: Secondary | ICD-10-CM | POA: Diagnosis not present

## 2024-09-13 DIAGNOSIS — Z79899 Other long term (current) drug therapy: Secondary | ICD-10-CM | POA: Insufficient documentation

## 2024-09-13 DIAGNOSIS — J9811 Atelectasis: Secondary | ICD-10-CM | POA: Diagnosis not present

## 2024-09-13 DIAGNOSIS — I10 Essential (primary) hypertension: Secondary | ICD-10-CM | POA: Diagnosis not present

## 2024-09-13 DIAGNOSIS — J90 Pleural effusion, not elsewhere classified: Secondary | ICD-10-CM | POA: Diagnosis not present

## 2024-09-13 DIAGNOSIS — R0602 Shortness of breath: Secondary | ICD-10-CM | POA: Insufficient documentation

## 2024-09-13 DIAGNOSIS — R059 Cough, unspecified: Secondary | ICD-10-CM | POA: Diagnosis not present

## 2024-09-13 DIAGNOSIS — Z7984 Long term (current) use of oral hypoglycemic drugs: Secondary | ICD-10-CM | POA: Diagnosis not present

## 2024-09-13 DIAGNOSIS — J189 Pneumonia, unspecified organism: Secondary | ICD-10-CM | POA: Diagnosis not present

## 2024-09-13 LAB — BASIC METABOLIC PANEL WITH GFR
Anion gap: 12 (ref 5–15)
BUN: 14 mg/dL (ref 8–23)
CO2: 22 mmol/L (ref 22–32)
Calcium: 9.5 mg/dL (ref 8.9–10.3)
Chloride: 103 mmol/L (ref 98–111)
Creatinine, Ser: 0.91 mg/dL (ref 0.61–1.24)
GFR, Estimated: >60 mL/min (ref >60)
Glucose, Bld: 168 mg/dL — ABNORMAL HIGH (ref 70–99)
Potassium: 4 mmol/L (ref 3.5–5.1)
Sodium: 137 mmol/L (ref 135–145)

## 2024-09-13 LAB — CBC
HCT: 39.5 % (ref 39.0–52.0)
Hemoglobin: 13.6 g/dL (ref 13.0–17.0)
MCH: 31.3 pg (ref 26.0–34.0)
MCHC: 34.4 g/dL (ref 30.0–36.0)
MCV: 90.8 fL (ref 80.0–100.0)
Platelets: 230 K/uL (ref 150–400)
RBC: 4.35 MIL/uL (ref 4.22–5.81)
RDW: 12.6 % (ref 11.5–15.5)
WBC: 11.3 K/uL — ABNORMAL HIGH (ref 4.0–10.5)
nRBC: 0 % (ref 0.0–0.2)

## 2024-09-13 LAB — TROPONIN T, HIGH SENSITIVITY: Troponin T High Sensitivity: <15 ng/L (ref 0–19)

## 2024-09-13 MED ORDER — IOHEXOL 350 MG/ML SOLN
75.0000 mL | Freq: Once | INTRAVENOUS | Status: AC | PRN
  Administered 2024-09-13: 75 mL via INTRAVENOUS

## 2024-09-13 NOTE — Discharge Instructions (Signed)
 No signs of blood clots today, heart conditions, kidney problems or pneumonia at this time.  You can discontinue the antibiotic.  It may be related to the BuSpar  and I would recommend decreasing the dose to once a day and then when you see your doctor on Monday you may be able to stop it altogether.

## 2024-09-13 NOTE — ED Triage Notes (Signed)
 Patient reports some shortness of breath and chest pressure. Was recently diagnosed with pneumonia. Says he was put on antibiotics and is on day 5, but says he has had some problems in the past with empyema and required lung surgery in the past. He said urgent care told him to come here for a CT scan.

## 2024-09-13 NOTE — Telephone Encounter (Signed)
 Increase Buspar  to 10 mg tid instead of bid, for 3 days, then ok to increase to 10 mg bid (so 40 mg total. )Encourage him to take the Xanax  prn right now, it will help with the anxiety which is contributing to the SOB.

## 2024-09-13 NOTE — Telephone Encounter (Signed)
 Reviewed recommendations with patient.

## 2024-09-13 NOTE — ED Provider Notes (Signed)
  EMERGENCY DEPARTMENT AT [facility] Provider Note   CSN: [id number] Arrival date & time: 09/13/24  1027     Patient presents with: Shortness of Breath and Cough   Marc Gates is a 73 y.o. male.   Patient is a 73 year old male with a history of hypertension, GERD, allergies, diabetes, prior empyema resulting in lung surgery who is presenting today with complaints of shortness of breath.  He reports the shortness of breath started last Saturday and because it was still persistent on Monday he followed up with his doctor.  He had a normal D-dimer but an x-ray that showed concern for patchy opacity and he was started on doxycycline .  He reports that he feels slightly short of breath all the time and notices it most when he is sitting or laying down.  He has not had any leg swelling and denies any cough, myalgias, fever or sputum production.  He has had a slight pain in the right side of his chest.  He has had no travel or surgeries within the last few months.  Because his shortness of breath was not improving he called his doctor back today and they recommended he come here for further imaging.  He has no known cardiac conditions and denies any swelling in his legs.  He has been taking doxycycline  since Monday.  The history is provided by the patient.  Shortness of Breath Associated symptoms: cough   Cough Associated symptoms: shortness of breath        Prior to Admission medications   Medication Sig Start Date End Date Taking? Authorizing Provider  doxycycline  (VIBRA -TABS) 100 MG tablet Take 100 mg by mouth 2 (two) times daily. 09/08/24 09/15/24 Yes [provider]  albuterol  (VENTOLIN  HFA) 108 (90 Base) MCG/ACT inhaler Inhale 1-2 puffs into the lungs every 4 (four) hours as needed for shortness of breath. 02/11/21   [provider]  ALPRAZolam  (XANAX ) 0.25 MG tablet Take 1 tablet (0.25 mg total) by mouth 2 (two) times daily as needed for anxiety.  08/27/24   Rhys Boyer T, PA-C  amLODipine -valsartan  (EXFORGE ) 5-160 MG tablet Take 1 tablet by mouth daily.    [provider]  aspirin  EC 81 MG tablet Take 81 mg by mouth daily.    [provider]  atorvastatin  (LIPITOR) 20 MG tablet Take 20 mg by mouth every evening.    [provider]  azelastine  (ASTELIN ) 0.1 % nasal spray Place 1 spray into both nostrils 2 (two) times daily as needed for rhinitis. Use in each nostril as directed    [provider]  benzonatate  (TESSALON ) 200 MG capsule Take 200 mg by mouth 3 (three) times daily as needed for cough.    [provider]  busPIRone  (BUSPAR ) 10 MG tablet Take 1 tablet (10 mg total) by mouth 2 (two) times daily. 08/27/24   Rhys Boyer DASEN, PA-C  EPINEPHrine  0.3 mg/0.3 mL IJ SOAJ injection Inject 0.3 mg into the muscle as needed for anaphylaxis. 12/07/18   [provider]  famotidine  (PEPCID ) 20 MG tablet Take 20 mg by mouth 2 (two) times daily. 01/25/21   [provider]  finasteride  (PROSCAR ) 5 MG tablet Take 5 mg by mouth daily.    [provider]  fluticasone  (FLONASE ) 50 MCG/ACT nasal spray Place 2 sprays into both nostrils daily as needed for allergies. 01/14/19   [provider]  glucose blood test strip 1 each by Other route as needed for other. Use as  instructed    [provider]  levocetirizine (XYZAL ) 5 MG tablet Take 5 mg by mouth every evening. 01/14/19   [provider]  meloxicam (MOBIC) 15 MG tablet Take 15 mg by mouth daily. Patient not taking: Reported on 08/27/2024 02/27/23   [provider]  metFORMIN  (GLUCOPHAGE -XR) 500 MG 24 hr tablet Take 2 tablets (1,000 mg total) by mouth 2 (two) times daily. 04/09/21   Jordan, Peter M, MD  mirabegron  ER (MYRBETRIQ ) 50 MG TB24 tablet Take 50 mg by mouth daily.    [provider]  montelukast  (SINGULAIR ) 10 MG tablet Take 10 mg by mouth at bedtime. 08/26/19   [provider]   Multiple Vitamin (MULTIVITAMIN WITH MINERALS) TABS tablet Take 1 tablet by mouth daily.    [provider]  ondansetron  (ZOFRAN ) 4 MG tablet Take 1 tablet (4 mg total) by mouth every 8 (eight) hours as needed for nausea or vomiting. 08/01/24   Sheldon Standing, MD  OZEMPIC , 2 MG/DOSE, 8 MG/3ML SOPN Inject 2 mg into the skin once a week.    [provider]  pantoprazole  (PROTONIX ) 40 MG tablet Take 40 mg by mouth 2 (two) times daily.  12/02/19   [provider]  propranolol (INDERAL) 10 MG tablet Take 1-2 tablets (10-20 mg total) by mouth 2 (two) times daily as needed. 08/27/24   Rhys Verneita DASEN, PA-C  tadalafil  (CIALIS ) 5 MG tablet Take 5 mg by mouth daily. 12/16/20   [provider]  traMADol  (ULTRAM ) 50 MG tablet Take 1-2 tablets (50-100 mg total) by mouth every 6 (six) hours as needed for moderate pain (pain score 4-6) or severe pain (pain score 7-10). Patient not taking: Reported on 08/27/2024 08/01/24   Sheldon Standing, MD  Vitamin D , Ergocalciferol , (DRISDOL ) 1.25 MG (50000 UNIT) CAPS capsule TAKE 1 CAPSULE BY MOUTH EVERY 14 DAYS Patient taking differently: 50,000 Units. TAKE 1 CAPSULE BY MOUTH EVERY 14 DAYS 07/21/22   Delores Shields A, DO  zolpidem  (AMBIEN ) 10 MG tablet Take 10 mg by mouth at bedtime.  01/29/19   [provider]    Allergies: Patient has no known allergies.    Review of Systems  Respiratory:  Positive for cough and shortness of breath.     Updated Vital Signs BP 126/85   Pulse 100   Temp 97.9 F (36.6 C) (Oral)   Resp 18   SpO2 96%   Physical Exam Vitals and nursing note reviewed.  Constitutional:      General: He is not in acute distress.    Appearance: He is well-developed.  HENT:     Head: Normocephalic and atraumatic.  Eyes:     Conjunctiva/sclera: Conjunctivae normal.     Pupils: Pupils are equal, round, and reactive to light.  Cardiovascular:     Rate and Rhythm: Normal rate and regular rhythm.     Pulses: Normal  pulses.     Heart sounds: No murmur heard. Pulmonary:     Effort: Pulmonary effort is normal. No respiratory distress.     Breath sounds: Normal breath sounds. No wheezing or rales.  Abdominal:     General: There is no distension.     Palpations: Abdomen is soft.     Tenderness: There is no abdominal tenderness. There is no guarding or rebound.  Musculoskeletal:        General: No tenderness. Normal range of motion.     Cervical back: Normal range of motion and neck supple.  Right lower leg: No edema.     Left lower leg: No edema.  Skin:    General: Skin is warm and dry.     Findings: No erythema or rash.  Neurological:     Mental Status: He is alert and oriented to person, place, and time.  Psychiatric:        Behavior: Behavior normal.     (all labs ordered are listed, but only abnormal results are displayed) Labs Reviewed  BASIC METABOLIC PANEL WITH GFR - Abnormal; Notable for the following components:      Result Value   Glucose, Bld 168 (*)    All other components within normal limits  CBC - Abnormal; Notable for the following components:   WBC 11.3 (*)    All other components within normal limits  TROPONIN T, HIGH SENSITIVITY    EKG: EKG Interpretation Date/Time:  Friday September 13 2024 10:36:53 EST Ventricular Rate:  94 PR Interval:  171 QRS Duration:  119 QT Interval:  364 QTC Calculation: 456 R Axis:   -51  Text Interpretation: Sinus rhythm Incomplete right bundle branch block Inferior infarct, old No significant change since last tracing Confirmed by Doretha Folks ([id number]) on 09/13/2024 11:00:48 AM  Radiology: CT Angio Chest PE W and/or Wo Contrast Result Date: 09/13/2024 EXAM: CTA of the Chest with contrast for PE 09/13/2024 12:21:08 PM TECHNIQUE: CTA of the chest was performed after the administration of 75 mL of iohexol  (OMNIPAQUE ) 350 MG/ML injection. Multiplanar reformatted images are provided for review. MIP images are provided for review.  Automated exposure control, iterative reconstruction, and/or weight based adjustment of the mA/kV was utilized to reduce the radiation dose to as low as reasonably achievable. COMPARISON: 03/05/2021 CLINICAL HISTORY: Pulmonary embolism (PE) suspected, high prob. FINDINGS: PULMONARY ARTERIES: No pulmonary embolism. Main pulmonary artery is normal in caliber. MEDIASTINUM: The heart and pericardium demonstrate no acute abnormality. There is no acute abnormality of the thoracic aorta. LYMPH NODES: No mediastinal, hilar or axillary lymphadenopathy. LUNGS AND PLEURA: Unchanged, loculated trace right pleural effusion with fibrolinear scarring and subsegmental atelectasis in the right lower lobe. Minimal posterior left basal atelectasis. Unchanged 3 mm right middle lobe and subpleural left lower lobe nodules; given the stability, these are benign. No pneumothorax. No new airspace consolidation or pleural effusion. UPPER ABDOMEN: Surgical staple line along the gastric antrum. SOFT TISSUES AND BONES: Minimal but symmetric bilateral gynecomastia. Mild multilevel thoracic degenerative disc disease. IMPRESSION: 1. No pulmonary embolism. 2. Unchanged, chronic trace right pleural effusion with right basilar scarring and atelectasis. Electronically signed by: Rogelia Myers MD 09/13/2024 01:51 PM EST RP Workstation: [id number]   DG Chest 2 View Result Date: 09/13/2024 EXAM: 2 VIEW(S) XRAY OF THE CHEST 09/13/2024 11:00:18 AM COMPARISON: 01/01/2024 CLINICAL HISTORY: shortness of breath FINDINGS: LUNGS AND PLEURA: No focal pulmonary opacity. No pulmonary edema. Persistent blunting of right costophrenic angle, likely due to scarring and/or small right pleural effusion with pleural thickening at right lung base. No pneumothorax. HEART AND MEDIASTINUM: No acute abnormality of the cardiac and mediastinal silhouettes. BONES AND SOFT TISSUES: No acute osseous abnormality. IMPRESSION: 1. No acute findings. 2. Persistent blunting of the  right costophrenic angle, favored scarring versus small right pleural effusion with pleural thickening at the right lung base. Electronically signed by: Katheleen Faes MD 09/13/2024 11:24 AM EST RP Workstation: [id number]     Procedures   Medications Ordered in the ED  iohexol  (OMNIPAQUE ) 350 MG/ML injection 75 mL (75 mLs Intravenous Contrast Given 09/13/24 1210)  Medical Decision Making Amount and/or Complexity of Data Reviewed Labs: ordered. Decision-making details documented in ED Course. Radiology: ordered and independent interpretation performed. Decision-making details documented in ED Course. ECG/medicine tests: ordered and independent interpretation performed. Decision-making details documented in ED Course.  Risk Prescription drug management.   Pt with multiple medical problems and comorbidities and presenting today with a complaint that caries a high risk for morbidity and mortality.  Here today with the above complaint of shortness of breath.  He has been using inhalers this week which he usually never uses and reports minimal improvement.  Patient sats are 96% on room air and he is able to walk without appearing winded.  Does have a prior history of empyema status postthoracotomy.  He has now been on doxycycline  since Monday without significant improvement but also does not have significant infectious symptoms.  No cough no fever.  Had a high normal D-dimer by PCP earlier in the week.  Mildly tachycardic here but otherwise vital signs are stable.  Will do a CTA to ensure no evidence of clot or development of new infection.  Also will rule out anemia, electrolyte abnormalities or renal disease.  Low suspicion for cardiac cause.  I independently interpreted patient's labs and EKG.  EKG without acute findings.  CBC is mildly elevated white count of 11 but normal hemoglobin.  BMP, troponin are within normal limits. I have independently visualized and  interpreted pt's images today.  Chest x-ray without acute findings here.  CT showed no signs of PE.  Radiology reports unchanged chronic trace right pleural effusion with right basilar scarring and atelectasis but no evidence of pneumonia at this time.  All of this was discussed with the patient.  Feel that he can discontinue the antibiotics.  He did report that he started BuSpar  2 weeks ago and question whether that could possibly be causing his symptoms.  He also did have an abdominal surgery and does report a lot of burping but is already on 2 acid blockers and feel that he is already optimized there.  He will follow-up with his doctor on Monday.       Final diagnoses:  SOB (shortness of breath)    ED Discharge Orders     None          Doretha Folks, MD 09/13/24 1439

## 2024-09-16 DIAGNOSIS — R059 Cough, unspecified: Secondary | ICD-10-CM | POA: Diagnosis not present

## 2024-09-16 DIAGNOSIS — L299 Pruritus, unspecified: Secondary | ICD-10-CM | POA: Diagnosis not present

## 2024-09-16 DIAGNOSIS — E1169 Type 2 diabetes mellitus with other specified complication: Secondary | ICD-10-CM | POA: Diagnosis not present

## 2024-09-16 DIAGNOSIS — F419 Anxiety disorder, unspecified: Secondary | ICD-10-CM | POA: Diagnosis not present

## 2024-09-17 ENCOUNTER — Ambulatory Visit: Admitting: Bariatrics

## 2024-09-17 DIAGNOSIS — J3089 Other allergic rhinitis: Secondary | ICD-10-CM | POA: Diagnosis not present

## 2024-09-17 DIAGNOSIS — J3081 Allergic rhinitis due to animal (cat) (dog) hair and dander: Secondary | ICD-10-CM | POA: Diagnosis not present

## 2024-09-17 DIAGNOSIS — J301 Allergic rhinitis due to pollen: Secondary | ICD-10-CM | POA: Diagnosis not present

## 2024-09-23 ENCOUNTER — Ambulatory Visit: Admitting: Bariatrics

## 2024-09-23 DIAGNOSIS — J3089 Other allergic rhinitis: Secondary | ICD-10-CM | POA: Diagnosis not present

## 2024-09-23 DIAGNOSIS — J3081 Allergic rhinitis due to animal (cat) (dog) hair and dander: Secondary | ICD-10-CM | POA: Diagnosis not present

## 2024-09-23 DIAGNOSIS — J301 Allergic rhinitis due to pollen: Secondary | ICD-10-CM | POA: Diagnosis not present

## 2024-09-25 DIAGNOSIS — L738 Other specified follicular disorders: Secondary | ICD-10-CM | POA: Diagnosis not present

## 2024-09-25 DIAGNOSIS — D1801 Hemangioma of skin and subcutaneous tissue: Secondary | ICD-10-CM | POA: Diagnosis not present

## 2024-09-25 DIAGNOSIS — L578 Other skin changes due to chronic exposure to nonionizing radiation: Secondary | ICD-10-CM | POA: Diagnosis not present

## 2024-09-25 DIAGNOSIS — D225 Melanocytic nevi of trunk: Secondary | ICD-10-CM | POA: Diagnosis not present

## 2024-09-25 DIAGNOSIS — L814 Other melanin hyperpigmentation: Secondary | ICD-10-CM | POA: Diagnosis not present

## 2024-09-25 DIAGNOSIS — L821 Other seborrheic keratosis: Secondary | ICD-10-CM | POA: Diagnosis not present

## 2024-09-25 DIAGNOSIS — L570 Actinic keratosis: Secondary | ICD-10-CM | POA: Diagnosis not present

## 2024-09-25 DIAGNOSIS — L820 Inflamed seborrheic keratosis: Secondary | ICD-10-CM | POA: Diagnosis not present

## 2024-09-30 DIAGNOSIS — J3089 Other allergic rhinitis: Secondary | ICD-10-CM | POA: Diagnosis not present

## 2024-09-30 DIAGNOSIS — J301 Allergic rhinitis due to pollen: Secondary | ICD-10-CM | POA: Diagnosis not present

## 2024-09-30 DIAGNOSIS — J3081 Allergic rhinitis due to animal (cat) (dog) hair and dander: Secondary | ICD-10-CM | POA: Diagnosis not present

## 2024-10-01 ENCOUNTER — Ambulatory Visit: Admitting: Bariatrics

## 2024-10-07 ENCOUNTER — Ambulatory Visit: Admitting: Bariatrics

## 2024-10-07 DIAGNOSIS — J301 Allergic rhinitis due to pollen: Secondary | ICD-10-CM | POA: Diagnosis not present

## 2024-10-07 DIAGNOSIS — J3081 Allergic rhinitis due to animal (cat) (dog) hair and dander: Secondary | ICD-10-CM | POA: Diagnosis not present

## 2024-10-07 DIAGNOSIS — J3089 Other allergic rhinitis: Secondary | ICD-10-CM | POA: Diagnosis not present

## 2024-10-10 ENCOUNTER — Ambulatory Visit: Admitting: Physician Assistant

## 2024-10-15 DIAGNOSIS — J3081 Allergic rhinitis due to animal (cat) (dog) hair and dander: Secondary | ICD-10-CM | POA: Diagnosis not present

## 2024-10-15 DIAGNOSIS — J301 Allergic rhinitis due to pollen: Secondary | ICD-10-CM | POA: Diagnosis not present

## 2024-10-15 DIAGNOSIS — J3089 Other allergic rhinitis: Secondary | ICD-10-CM | POA: Diagnosis not present

## 2024-11-11 ENCOUNTER — Encounter: Payer: Self-pay | Admitting: Physician Assistant

## 2024-11-11 ENCOUNTER — Ambulatory Visit: Admitting: Physician Assistant

## 2024-11-11 DIAGNOSIS — G4700 Insomnia, unspecified: Secondary | ICD-10-CM

## 2024-11-11 DIAGNOSIS — F410 Panic disorder [episodic paroxysmal anxiety] without agoraphobia: Secondary | ICD-10-CM | POA: Diagnosis not present

## 2024-11-11 DIAGNOSIS — G4733 Obstructive sleep apnea (adult) (pediatric): Secondary | ICD-10-CM

## 2024-11-11 MED ORDER — SERTRALINE HCL 25 MG PO TABS: ORAL_TABLET | ORAL | Status: AC

## 2024-11-11 NOTE — Progress Notes (Signed)
 "     [facility] Med Check  Patient ID: Marc Gates,  MRN: [id number]  PCP: Dwight Trula SQUIBB, MD  Date of Evaluation: 11/11/2024 Time spent:20 minutes  Chief Complaint:  Chief Complaint   Anxiety; Follow-up    HISTORY/CURRENT STATUS: HPI  For routine med check  We started BuSpar  a few months ago.  After about 6 weeks it was not helpful at all.  He saw his PCP who changed him to Zoloft  25 mg.  He has been on that for about 6 weeks.  He is not having panic attacks now but he is sleeping 10 to 10.5 hours per night.  That is unusual for him.  He stopped the Ambien  and is still sleeping that much.  He uses his CPAP faithfully.  The Zoloft  has been the only change.  Has not been taking the Xanax  at all.  He is able to enjoy things.  He took one of his cousins to [place] recently and enjoyed that.  Energy and motivation are good.  No extreme sadness, tearfulness, or feelings of hopelessness.  ADLs and personal hygiene are normal.   Denies any changes in concentration, making decisions, or remembering things.  Appetite has not changed.  Weight is stable.  No mania, delirium, AH/VH.  No SI/HI.  Individual Medical History/ Review of Systems: Changes? :No   Past medications for mental health diagnoses include: Zoloft  ? Unsure of the antidpressant.  He only took 1 pill because he slept the whole next day.  BuSpar , Xanax   Allergies: Patient has no known allergies.  Current Medications: Current Medications[1] Medication Side Effects: none  Family Medical/ Social History: Changes? No  MENTAL HEALTH EXAM:  There were no vitals taken for this visit.There is no height or weight on file to calculate BMI.  General Appearance: Casual and Well Groomed  Eye Contact:  Good  Speech:  Clear and Coherent and Normal Rate  Volume:  Normal  Mood:  Euthymic  Affect:  Congruent  Thought Process:  Goal Directed and Descriptions of Associations: Circumstantial  Orientation:  Full (Time, Place, and Person)   Thought Content: Logical   Suicidal Thoughts:  No  Homicidal Thoughts:  No  Memory:  WNL  Judgement:  Good  Insight:  Good  Psychomotor Activity:  Normal  Concentration:  Concentration: Good  Recall:  Good  Fund of Knowledge: Good  Language: Good  Assets:  Communication Skills Desire for Improvement Financial Resources/Insurance Housing Resilience Transportation  ADL's:  Intact  Cognition: WNL  Prognosis:  Good   DIAGNOSES:    ICD-10-CM   1. Panic attacks  F41.0     2. Obstructive sleep apnea  G47.33     3. Insomnia, unspecified type  G47.00       Receiving Psychotherapy: No   RECOMMENDATIONS:   PDMP reviewed.  Xanax  filled 09/30/2024. I provided approximately  20 minutes of face to face time during this encounter, including time spent before and after the visit in records review, medical decision making, counseling pertinent to today's visit, and charting.   We discussed the Zoloft  and side effect of increased sleep.  He is very med sensitive so even at this dose it may be affecting him negatively.  I recommend decreasing to 12.5 mg p.o. nightly.  If the anxiety recurs after about 1 week, and he is not sleeping as long as he is now, I suggest he take 12.5 mg twice daily.  Continue Xanax  0.25 mg, 1 p.o. twice daily as needed  anxiety.  Change Zoloft  25 mg to one half p.o. nightly.  (See note above.) Return in 6 weeks.  Verneita Cooks, PA-C     [1]  Current Outpatient Medications:    albuterol  (VENTOLIN  HFA) 108 (90 Base) MCG/ACT inhaler, Inhale 1-2 puffs into the lungs every 4 (four) hours as needed for shortness of breath., Disp: , Rfl:    ALPRAZolam  (XANAX ) 0.25 MG tablet, Take 1 tablet (0.25 mg total) by mouth 2 (two) times daily as needed for anxiety., Disp: 30 tablet, Rfl: 2   amLODipine -valsartan  (EXFORGE ) 5-160 MG tablet, Take 1 tablet by mouth daily., Disp: , Rfl:    aspirin  EC 81 MG tablet, Take 81 mg by mouth daily., Disp: , Rfl:    atorvastatin  (LIPITOR)  20 MG tablet, Take 20 mg by mouth every evening., Disp: , Rfl:    azelastine  (ASTELIN ) 0.1 % nasal spray, Place 1 spray into both nostrils 2 (two) times daily as needed for rhinitis. Use in each nostril as directed, Disp: , Rfl:    EPINEPHrine  0.3 mg/0.3 mL IJ SOAJ injection, Inject 0.3 mg into the muscle as needed for anaphylaxis., Disp: , Rfl:    famotidine  (PEPCID ) 20 MG tablet, Take 20 mg by mouth 2 (two) times daily., Disp: , Rfl:    finasteride  (PROSCAR ) 5 MG tablet, Take 5 mg by mouth daily., Disp: , Rfl:    fluticasone  (FLONASE ) 50 MCG/ACT nasal spray, Place 2 sprays into both nostrils daily as needed for allergies., Disp: , Rfl:    glucose blood test strip, 1 each by Other route as needed for other. Use as instructed, Disp: , Rfl:    levocetirizine (XYZAL ) 5 MG tablet, Take 5 mg by mouth every evening., Disp: , Rfl:    metFORMIN  (GLUCOPHAGE -XR) 500 MG 24 hr tablet, Take 2 tablets (1,000 mg total) by mouth 2 (two) times daily., Disp: , Rfl:    mirabegron  ER (MYRBETRIQ ) 50 MG TB24 tablet, Take 50 mg by mouth daily., Disp: , Rfl:    montelukast  (SINGULAIR ) 10 MG tablet, Take 10 mg by mouth at bedtime., Disp: , Rfl:    Multiple Vitamin (MULTIVITAMIN WITH MINERALS) TABS tablet, Take 1 tablet by mouth daily., Disp: , Rfl:    ondansetron  (ZOFRAN ) 4 MG tablet, Take 1 tablet (4 mg total) by mouth every 8 (eight) hours as needed for nausea or vomiting., Disp: 10 tablet, Rfl: 5   OZEMPIC , 2 MG/DOSE, 8 MG/3ML SOPN, Inject 2 mg into the skin once a week., Disp: , Rfl:    pantoprazole  (PROTONIX ) 40 MG tablet, Take 40 mg by mouth 2 (two) times daily. , Disp: , Rfl:    tadalafil  (CIALIS ) 5 MG tablet, Take 5 mg by mouth daily., Disp: , Rfl:    Vitamin D , Ergocalciferol , (DRISDOL ) 1.25 MG (50000 UNIT) CAPS capsule, TAKE 1 CAPSULE BY MOUTH EVERY 14 DAYS, Disp: 6 capsule, Rfl: 0   benzonatate  (TESSALON ) 200 MG capsule, Take 200 mg by mouth 3 (three) times daily as needed for cough. (Patient not taking:  Reported on 11/11/2024), Disp: , Rfl:    busPIRone  (BUSPAR ) 10 MG tablet, Take 1 tablet (10 mg total) by mouth 2 (two) times daily. (Patient not taking: Reported on 11/11/2024), Disp: 180 tablet, Rfl: 1   meloxicam (MOBIC) 15 MG tablet, Take 15 mg by mouth daily. (Patient not taking: Reported on 08/27/2024), Disp: , Rfl:    sertraline  (ZOLOFT ) 25 MG tablet, 1/2 po at bedtime for 1 week, then may increase to 1/2 po bid., Disp: ,  Rfl:    traMADol  (ULTRAM ) 50 MG tablet, Take 1-2 tablets (50-100 mg total) by mouth every 6 (six) hours as needed for moderate pain (pain score 4-6) or severe pain (pain score 7-10). (Patient not taking: Reported on 08/27/2024), Disp: 20 tablet, Rfl: 0   zolpidem  (AMBIEN ) 10 MG tablet, Take 10 mg by mouth at bedtime.  (Patient not taking: Reported on 11/11/2024), Disp: , Rfl:   "

## 2024-12-23 ENCOUNTER — Ambulatory Visit: Admitting: Physician Assistant
